# Patient Record
Sex: Female | Born: 1965 | Race: Black or African American | Hispanic: No | Marital: Single | Smoking: Former smoker
Health system: Southern US, Community
[De-identification: ages and names within clinical notes are randomized; demographics above are authoritative.]

## PROBLEM LIST (undated history)

## (undated) DIAGNOSIS — Q2112 Patent foramen ovale: Secondary | ICD-10-CM

## (undated) DIAGNOSIS — H9192 Unspecified hearing loss, left ear: Secondary | ICD-10-CM

## (undated) DIAGNOSIS — K219 Gastro-esophageal reflux disease without esophagitis: Secondary | ICD-10-CM

## (undated) DIAGNOSIS — Q211 Atrial septal defect: Secondary | ICD-10-CM

## (undated) DIAGNOSIS — Z86018 Personal history of other benign neoplasm: Secondary | ICD-10-CM

## (undated) DIAGNOSIS — J9601 Acute respiratory failure with hypoxia: Secondary | ICD-10-CM

## (undated) DIAGNOSIS — J449 Chronic obstructive pulmonary disease, unspecified: Secondary | ICD-10-CM

## (undated) DIAGNOSIS — U071 COVID-19: Secondary | ICD-10-CM

## (undated) HISTORY — PX: OTHER SURGICAL HISTORY: SHX169

## (undated) HISTORY — DX: Chronic obstructive pulmonary disease, unspecified: J44.9

## (undated) HISTORY — DX: Gastro-esophageal reflux disease without esophagitis: K21.9

## (undated) HISTORY — DX: Unspecified hearing loss, left ear: H91.92

## (undated) HISTORY — DX: Personal history of other benign neoplasm: Z86.018

---
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
## 2015-11-03 DIAGNOSIS — K635 Polyp of colon: Secondary | ICD-10-CM | POA: Insufficient documentation

## 2016-03-04 DIAGNOSIS — E785 Hyperlipidemia, unspecified: Secondary | ICD-10-CM | POA: Insufficient documentation

## 2018-12-05 ENCOUNTER — Ambulatory Visit (HOSPITAL_BASED_OUTPATIENT_CLINIC_OR_DEPARTMENT_OTHER): Payer: Medicaid Other | Admitting: Family Medicine

## 2018-12-05 ENCOUNTER — Observation Stay (HOSPITAL_COMMUNITY)
Admission: EM | Admit: 2018-12-05 | Discharge: 2018-12-07 | Disposition: A | Discharge status: Home / Self Care | Payer: Medicaid Other | Attending: Internal Medicine | Admitting: Internal Medicine

## 2018-12-05 ENCOUNTER — Other Ambulatory Visit: Payer: Self-pay

## 2018-12-05 ENCOUNTER — Encounter: Payer: Self-pay | Admitting: Family Medicine

## 2018-12-05 ENCOUNTER — Encounter (HOSPITAL_COMMUNITY): Payer: Self-pay

## 2018-12-05 ENCOUNTER — Emergency Department (HOSPITAL_COMMUNITY): Payer: Medicaid Other

## 2018-12-05 VITALS — BP 167/105 | HR 96 | Temp 98.7°F | Resp 18 | Ht 70.0 in | Wt 165.6 lb

## 2018-12-05 DIAGNOSIS — Z7951 Long term (current) use of inhaled steroids: Secondary | ICD-10-CM | POA: Diagnosis not present

## 2018-12-05 DIAGNOSIS — J441 Chronic obstructive pulmonary disease with (acute) exacerbation: Secondary | ICD-10-CM | POA: Insufficient documentation

## 2018-12-05 DIAGNOSIS — R0602 Shortness of breath: Secondary | ICD-10-CM

## 2018-12-05 DIAGNOSIS — Z87891 Personal history of nicotine dependence: Secondary | ICD-10-CM | POA: Insufficient documentation

## 2018-12-05 DIAGNOSIS — J449 Chronic obstructive pulmonary disease, unspecified: Secondary | ICD-10-CM

## 2018-12-05 DIAGNOSIS — D333 Benign neoplasm of cranial nerves: Secondary | ICD-10-CM | POA: Diagnosis not present

## 2018-12-05 DIAGNOSIS — H9192 Unspecified hearing loss, left ear: Secondary | ICD-10-CM | POA: Insufficient documentation

## 2018-12-05 DIAGNOSIS — Z86018 Personal history of other benign neoplasm: Secondary | ICD-10-CM | POA: Insufficient documentation

## 2018-12-05 DIAGNOSIS — R0902 Hypoxemia: Secondary | ICD-10-CM | POA: Diagnosis not present

## 2018-12-05 DIAGNOSIS — R002 Palpitations: Secondary | ICD-10-CM | POA: Diagnosis not present

## 2018-12-05 DIAGNOSIS — R030 Elevated blood-pressure reading, without diagnosis of hypertension: Secondary | ICD-10-CM | POA: Diagnosis not present

## 2018-12-05 DIAGNOSIS — R062 Wheezing: Secondary | ICD-10-CM | POA: Diagnosis present

## 2018-12-05 DIAGNOSIS — J439 Emphysema, unspecified: Secondary | ICD-10-CM

## 2018-12-05 DIAGNOSIS — E876 Hypokalemia: Secondary | ICD-10-CM | POA: Diagnosis not present

## 2018-12-05 DIAGNOSIS — R739 Hyperglycemia, unspecified: Secondary | ICD-10-CM

## 2018-12-05 DIAGNOSIS — J4489 Other specified chronic obstructive pulmonary disease: Secondary | ICD-10-CM

## 2018-12-05 DIAGNOSIS — I8392 Asymptomatic varicose veins of left lower extremity: Secondary | ICD-10-CM | POA: Insufficient documentation

## 2018-12-05 DIAGNOSIS — I83812 Varicose veins of left lower extremities with pain: Secondary | ICD-10-CM | POA: Diagnosis not present

## 2018-12-05 DIAGNOSIS — J9601 Acute respiratory failure with hypoxia: Secondary | ICD-10-CM | POA: Diagnosis not present

## 2018-12-05 DIAGNOSIS — R51 Headache: Secondary | ICD-10-CM | POA: Diagnosis not present

## 2018-12-05 HISTORY — DX: Personal history of other benign neoplasm: Z86.018

## 2018-12-05 HISTORY — DX: Unspecified hearing loss, left ear: H91.92

## 2018-12-05 LAB — BASIC METABOLIC PANEL
Anion gap: 11 (ref 5–15)
BUN: 8 mg/dL (ref 6–20)
CHLORIDE: 103 mmol/L (ref 98–111)
CO2: 27 mmol/L (ref 22–32)
Calcium: 9.6 mg/dL (ref 8.9–10.3)
Creatinine, Ser: 0.97 mg/dL (ref 0.44–1.00)
GFR calc Af Amer: >60 mL/min (ref >60)
GFR calc non Af Amer: >60 mL/min (ref >60)
Glucose, Bld: 97 mg/dL (ref 70–99)
Potassium: 4.3 mmol/L (ref 3.5–5.1)
SODIUM: 141 mmol/L (ref 135–145)

## 2018-12-05 LAB — CBC
HCT: 47.6 % — ABNORMAL HIGH (ref 36.0–46.0)
Hemoglobin: 15.5 g/dL — ABNORMAL HIGH (ref 12.0–15.0)
MCH: 30.8 pg (ref 26.0–34.0)
MCHC: 32.6 g/dL (ref 30.0–36.0)
MCV: 94.6 fL (ref 80.0–100.0)
Platelets: 191 10*3/uL (ref 150–400)
RBC: 5.03 MIL/uL (ref 3.87–5.11)
RDW: 12.4 % (ref 11.5–15.5)
WBC: 8.1 10*3/uL (ref 4.0–10.5)
nRBC: 0 % (ref 0.0–0.2)

## 2018-12-05 LAB — I-STAT VENOUS BLOOD GAS, ED
BICARBONATE: 27.2 mmol/L (ref 20.0–28.0)
O2 Saturation: 56 %
TCO2: 29 mmol/L (ref 22–32)
pCO2, Ven: 51 mmHg (ref 44.0–60.0)
pH, Ven: 7.335 (ref 7.250–7.430)
pO2, Ven: 32 mmHg (ref 32.0–45.0)

## 2018-12-05 LAB — I-STAT TROPONIN, ED: Troponin i, poc: 0.01 ng/mL (ref 0.00–0.08)

## 2018-12-05 MED ORDER — ACETAMINOPHEN 325 MG PO TABS
650.0000 mg | ORAL_TABLET | Freq: Four times a day (QID) | ORAL | Status: DC | PRN
  Administered 2018-12-06: 650 mg via ORAL
  Filled 2018-12-05: qty 2

## 2018-12-05 MED ORDER — ENOXAPARIN SODIUM 40 MG/0.4ML ~~LOC~~ SOLN: 40.0000 mg | SUBCUTANEOUS | Status: DC

## 2018-12-05 MED ORDER — IPRATROPIUM BROMIDE 0.02 % IN SOLN
0.5000 mg | Freq: Once | RESPIRATORY_TRACT | Status: AC
  Administered 2018-12-05: 0.5 mg via RESPIRATORY_TRACT
  Filled 2018-12-05: qty 2.5

## 2018-12-05 MED ORDER — ALBUTEROL (5 MG/ML) CONTINUOUS INHALATION SOLN
10.0000 mg/h | INHALATION_SOLUTION | Freq: Once | RESPIRATORY_TRACT | Status: AC
  Administered 2018-12-05: 10 mg/h via RESPIRATORY_TRACT
  Filled 2018-12-05: qty 20

## 2018-12-05 MED ORDER — ONDANSETRON HCL 4 MG PO TABS: 4.0000 mg | ORAL_TABLET | Freq: Four times a day (QID) | ORAL | Status: DC | PRN

## 2018-12-05 MED ORDER — ONDANSETRON HCL 4 MG/2ML IJ SOLN: 4.0000 mg | Freq: Four times a day (QID) | INTRAMUSCULAR | Status: DC | PRN

## 2018-12-05 MED ORDER — METHYLPREDNISOLONE SODIUM SUCC 40 MG IJ SOLR
40.0000 mg | Freq: Four times a day (QID) | INTRAMUSCULAR | Status: DC
  Administered 2018-12-06 – 2018-12-07 (×6): 40 mg via INTRAVENOUS
  Filled 2018-12-05 (×6): qty 1

## 2018-12-05 MED ORDER — IPRATROPIUM-ALBUTEROL 0.5-2.5 (3) MG/3ML IN SOLN
3.0000 mL | RESPIRATORY_TRACT | Status: DC
  Administered 2018-12-06 (×2): 3 mL via RESPIRATORY_TRACT
  Filled 2018-12-05 (×2): qty 3

## 2018-12-05 MED ORDER — ORAL CARE MOUTH RINSE: 15.0000 mL | Freq: Two times a day (BID) | OROMUCOSAL | Status: DC

## 2018-12-05 MED ORDER — BUDESONIDE 0.25 MG/2ML IN SUSP
0.2500 mg | Freq: Two times a day (BID) | RESPIRATORY_TRACT | Status: DC
  Administered 2018-12-06 – 2018-12-07 (×3): 0.25 mg via RESPIRATORY_TRACT
  Filled 2018-12-05 (×4): qty 2

## 2018-12-05 MED ORDER — ALBUTEROL (5 MG/ML) CONTINUOUS INHALATION SOLN
10.0000 mg/h | INHALATION_SOLUTION | Freq: Once | RESPIRATORY_TRACT | Status: AC
  Administered 2018-12-05: 10 mg/h via RESPIRATORY_TRACT

## 2018-12-05 MED ORDER — HYDROCODONE-ACETAMINOPHEN 5-325 MG PO TABS
1.0000 | ORAL_TABLET | Freq: Once | ORAL | Status: AC
  Administered 2018-12-05: 1 via ORAL
  Filled 2018-12-05: qty 1

## 2018-12-05 MED ORDER — IPRATROPIUM-ALBUTEROL 0.5-2.5 (3) MG/3ML IN SOLN: 3.0000 mL | RESPIRATORY_TRACT | Status: DC | PRN

## 2018-12-05 MED ORDER — ACETAMINOPHEN 650 MG RE SUPP: 650.0000 mg | Freq: Four times a day (QID) | RECTAL | Status: DC | PRN

## 2018-12-05 MED ORDER — METHYLPREDNISOLONE SODIUM SUCC 125 MG IJ SOLR
125.0000 mg | Freq: Once | INTRAMUSCULAR | Status: AC
  Administered 2018-12-05: 125 mg via INTRAVENOUS
  Filled 2018-12-05: qty 2

## 2018-12-05 NOTE — ED Provider Notes (Signed)
[facility] EMERGENCY DEPARTMENT Provider Note   CSN: [id number] Arrival date & time: 12/05/18  1811     History   Chief Complaint Chief Complaint  Patient presents with  . Shortness of Breath    HPI Madison Phillips is a 52 y.o. female.  The history is provided by the patient.  Shortness of Breath  This is a recurrent problem. The problem occurs continuously.The current episode started more than 2 days ago. The problem has been gradually worsening. Associated symptoms include wheezing. Pertinent negatives include no fever, no coryza, no rhinorrhea, no sore throat, no ear pain, no cough, no hemoptysis, no PND, no chest pain, no syncope, no vomiting, no abdominal pain, no rash, no leg pain and no leg swelling. It is unknown what precipitated the problem. She has tried inhaled steroids for the symptoms. The treatment provided no relief. She has had prior ED visits. Associated medical issues include COPD. Associated medical issues do not include PE or CAD.    Past Medical History:  Diagnosis Date  . COPD (chronic obstructive pulmonary disease) ([place])   . 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 Left ear hearing loss 12/05/2018    Patient Active Problem List   Diagnosis Date Noted  . Left ear hearing loss 12/05/2018  . 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 Varicose veins of left lower extremity 12/05/2018  . Acute respiratory failure with hypoxia ([place]) 12/05/2018    History reviewed. No pertinent surgical history.   OB History   No obstetric history on file.      Home Medications    Prior to Admission medications   Medication Sig Start Date End Date Taking? Authorizing Provider  budesonide-formoterol (SYMBICORT) 80-4.5 MCG/ACT inhaler Inhale 1 puff into the lungs 2 (two) times daily.    [provider]    Family History No family history on file.  Social History Social History   Tobacco Use  . Smoking status: Former Research scientist (life sciences)  .  Smokeless tobacco: Never Used  Substance Use Topics  . Alcohol use: Yes    Alcohol/week: 2.0 - 3.0 standard drinks    Types: 2 - 3 Cans of beer per week    Comment: a night  . Drug use: Not Currently     Allergies   Patient has no known allergies.   Review of Systems Review of Systems  Constitutional: Negative for chills and fever.  HENT: Negative for ear pain, rhinorrhea and sore throat.   Eyes: Negative for pain and visual disturbance.  Respiratory: Positive for shortness of breath and wheezing. Negative for cough and hemoptysis.   Cardiovascular: Negative for chest pain, palpitations, leg swelling, syncope and PND.  Gastrointestinal: Negative for abdominal pain and vomiting.  Genitourinary: Negative for dysuria and hematuria.  Musculoskeletal: Negative for arthralgias and back pain.  Skin: Negative for color change and rash.  Neurological: Negative for seizures and syncope.  All other systems reviewed and are negative.    Physical Exam Updated Vital Signs  ED Triage Vitals  Enc Vitals Group     BP 12/05/18 1818 (!) 171/115     Pulse Rate 12/05/18 1818 (!) 102     Resp 12/05/18 1818 (!) 22     Temp 12/05/18 1818 98.6 F (37 C)     Temp Source 12/05/18 1818 Oral     SpO2 12/05/18 1818 92 %     Weight 12/05/18 1819 165 lb (74.8 kg)     Height 12/05/18 1819 5\' 10"  (1.778  m)     Head Circumference --      Peak Flow --      Pain Score 12/05/18 1818 7     Pain Loc --      Pain Edu? --      Excl. in [place]? --     Physical Exam Vitals signs and nursing note reviewed.  Constitutional:      General: She is in acute distress.     Appearance: She is well-developed and normal weight.  HENT:     Head: Normocephalic and atraumatic.     Mouth/Throat:     Mouth: Mucous membranes are moist.  Eyes:     Conjunctiva/sclera: Conjunctivae normal.     Pupils: Pupils are equal, round, and reactive to light.  Neck:     Musculoskeletal: Normal range of motion and neck supple.    Cardiovascular:     Rate and Rhythm: Normal rate and regular rhythm.     Heart sounds: No murmur.  Pulmonary:     Effort: Tachypnea, accessory muscle usage and respiratory distress present.     Breath sounds: Decreased breath sounds present. No wheezing, rhonchi or rales.  Abdominal:     Palpations: Abdomen is soft.     Tenderness: There is no abdominal tenderness.  Musculoskeletal: Normal range of motion.     Right lower leg: No edema.     Left lower leg: No edema.  Skin:    General: Skin is warm and dry.     Capillary Refill: Capillary refill takes less than 2 seconds.  Neurological:     General: No focal deficit present.     Mental Status: She is alert.      ED Treatments / Results  Labs (all labs ordered are listed, but only abnormal results are displayed) Labs Reviewed  CBC - Abnormal; Notable for the following components:      Result Value   Hemoglobin 15.5 (*)    HCT 47.6 (*)    All other components within normal limits  BASIC METABOLIC PANEL  BLOOD GAS, VENOUS  I-STAT TROPONIN, ED  I-STAT VENOUS BLOOD GAS, ED    EKG EKG Interpretation  Date/Time:  Tuesday December 05 2018 18:18:27 EST Ventricular Rate:  106 PR Interval:  142 QRS Duration: 68 QT Interval:  338 QTC Calculation: 448 R Axis:   80 Text Interpretation:  Sinus tachycardia Biatrial enlargement Left ventricular hypertrophy ST-t wave abnormality Abnormal ekg Confirmed by Carmin Muskrat [phone]) on 12/05/2018 6:26:13 PM   Radiology Dg Chest Portable 1 View  Result Date: 12/05/2018 CLINICAL DATA:  Shortness of breath EXAM: PORTABLE CHEST 1 VIEW COMPARISON:  None. FINDINGS: Cardiac shadow is within normal limits. The lungs are hyperinflated bilaterally. No focal infiltrate or sizable effusion are seen. No bony abnormality is noted. IMPRESSION: COPD without acute abnormality. Electronically Signed   By: Inez Catalina M.D.   On: 12/05/2018 19:31    Procedures .Critical Care Performed by: Lennice Sites, DO Authorized by: Lennice Sites, DO   Critical care provider statement:    Critical care time (minutes):  35   Critical care was necessary to treat or prevent imminent or life-threatening deterioration of the following conditions:  Respiratory failure   Critical care was time spent personally by me on the following activities:  Development of treatment plan with patient or surrogate, discussions with primary provider, evaluation of patient's response to treatment, obtaining history from patient or surrogate, ordering and performing treatments and interventions, ordering and review of  laboratory studies, ordering and review of radiographic studies, pulse oximetry, re-evaluation of patient's condition and review of old charts   I assumed direction of critical care for this patient from another provider in my specialty: no     (including critical care time)  Medications Ordered in ED Medications  albuterol (PROVENTIL,VENTOLIN) solution continuous neb (10 mg/hr Nebulization Given 12/05/18 1850)  ipratropium (ATROVENT) nebulizer solution 0.5 mg (0.5 mg Nebulization Given 12/05/18 1849)  methylPREDNISolone sodium succinate (SOLU-MEDROL) 125 mg/2 mL injection 125 mg (125 mg Intravenous Given 12/05/18 1845)  HYDROcodone-acetaminophen (NORCO/VICODIN) 5-325 MG per tablet 1 tablet (1 tablet Oral Given 12/05/18 2005)  albuterol (PROVENTIL,VENTOLIN) solution continuous neb (10 mg/hr Nebulization Given 12/05/18 2015)     Initial Impression / Assessment and Plan / ED Course  I have reviewed the triage vital signs and the nursing notes.  Pertinent labs & imaging results that were available during my care of the patient were reviewed by me and considered in my medical decision making (see chart for details).     Madison Phillips is a 52 year old female with history of COPD who presents the ED with shortness of breath.  Patient hypoxic in triage that improved with 2 L.  Patient otherwise with  unremarkable vitals except for mild tachycardia.  Patient with shortness of breath for the last several days.  Has a history of COPD but is not on any medications as she moved here last year and has not established care.  She has been using her friend's Symbicort without much relief.  Patient with diminished breath sounds on exam.  Poor air movement.  Signs of respiratory distress.  Will give albuterol continuous treatment, Atrovent, Solu-Medrol.  Will consider BiPAP although patient appears that she does not want that at this time.  Will get chest x-ray, basic labs.  EKG shows sinus rhythm.  No signs of ischemic changes.  Patient with improvement following breathing treatment.  Has improvement of air movement.  Will order second continuous and admit patient for COPD exacerbation.  No signs of pneumonia, pneumothorax, pleural effusion on chest x-ray.  Blood gas overall reassuring.  pH is normal.  CO2 within normal limits.  No significant electrolyte abnormality, kidney injury, anemia.  Troponin within normal limits.  Low concern for PE at this time given likely COPD.  Patient has chronic left calf pain.  There is no signs of leg swelling.  Patient admitted to hospitalist for further care.  This chart was dictated using voice recognition software.  Despite best efforts to proofread,  errors can occur which can change the documentation meaning.   Final Clinical Impressions(s) / ED Diagnoses   Final diagnoses:  COPD exacerbation ([place])  Acute respiratory failure with hypoxia [facility])    ED Discharge Orders    None       Lennice Sites, DO 12/05/18 2032

## 2018-12-05 NOTE — Progress Notes (Addendum)
Pt started on second 10mg  Albuterol CAT. Pt with improved aeration on R, but very diminished on L. SpO2 90% on RA. RT will continue to monitor.

## 2018-12-05 NOTE — ED Notes (Signed)
EDP notified on pt.'s left lower leg pain .

## 2018-12-05 NOTE — Progress Notes (Signed)
Subjective:    Patient ID: Madison Phillips, female    DOB: [birth year], 52 y.o.   MRN: [id number]  HPI       52 year old female new to the practice.  Patient states that she moved here about a year ago from [place].  Patient states that her past medical history is significant for diagnosis of COPD in 2014.  Patient states that up until that time she had smoked about a pack a day of cigarettes but quit smoking in 2014 after her diagnosis.  Patient states that over the past 2 weeks she has had worsening of her shortness of breath and patient states that she gets shortness of breath with minimal exertion such as getting out of bed and attempting to walk.  Patient states that today she feels as if she has had some onset of chills, nasal congestion and occasional nonproductive cough.  Patient also states that over the past 2 weeks she has felt like her heart is fluttering and pounding and this happens 2-3 times daily.  Patient reports a history of acoustic neuroma on the left which was treated with CyberKnife surgery.  Patient reports that she has had recent episodes of tightness which starts in her left forehead, causes her left eye to spasm closed and she feels weakness on her left side as if she is having a stroke.  Patient states that the symptoms resolve and she will return to baseline.  Patient also reports a history of left-sided varicose veins.  Patient is currently working as a Radiation protection practitioner but states that at times she gets short of breath when talking especially if she starts getting into an intense conversation.      Patient states that she does not like to take albuterol for her shortness of breath as this causes her to have increased sensation of palpitations/increased heart rate.  Patient is currently taking Symbicort to help with her shortness of breath but she does not feel that this is still working.  Patient reports that she has never been prescribed Symbicort but has been taking  Symbicort that was given to her by someone else.  Patient states that she was on a purple round medicine in the past which did seem to help with her breathing.  Patient is currently on no medications that were specifically prescribed for her because she has been without insurance and cannot afford medications.      Patient reports family history of maternal aunt dying from heart disease and patient's father died from lung cancer.  Patient states that her father smoked approximately 4 packs/day of tobacco/cigarettes.  Patient reports that she no longer smokes since 2014 when she was diagnosed with COPD.  Patient does drink 2-3 beers on a daily basis.  Patient is currently living with her adult sister.  Patient reports that her surgical history consist of her CyberKnife therapy and remote history of surgery for an ectopic pregnancy.          Review of Systems  Constitutional: Positive for chills and fatigue. Negative for fever.  HENT: Positive for congestion. Negative for sore throat and trouble swallowing.   Eyes: Negative for photophobia and visual disturbance.  Respiratory: Positive for cough, shortness of breath and wheezing.   Cardiovascular: Positive for palpitations and leg swelling. Negative for chest pain.  Gastrointestinal: Negative for abdominal pain and nausea.  Endocrine: Negative for polydipsia, polyphagia and polyuria.  Genitourinary: Negative for dysuria and frequency.  Musculoskeletal: Positive for arthralgias. Negative for gait  problem.  Neurological: Positive for dizziness, facial asymmetry, weakness, numbness and headaches. Negative for syncope and speech difficulty.  Hematological: Negative for adenopathy. Does not bruise/bleed easily.       Objective:   Physical Exam BP (!) 167/105 (BP Location: Right Arm, Patient Position: Sitting, Cuff Size: Normal)   Pulse 96   Temp 98.7 F (37.1 C) (Oral)   Resp 18   Ht 5\' 10"  (1.778 m)   Wt 165 lb 9.6 oz (75.1 kg)   SpO2 91%    BMI 23.76 kg/m Nurse's notes and vital signs reviewed General- well-nourished, well-developed female who appears slightly anxious and has mild increase in respiratory rate mild increased work of breathing but no acute distress who is wearing oxygen by nasal cannula (placed on patient by CMA here in office as patient with  ENT- right TM dull, left TM obscured by cerumen within the canal.  Nares with moderate edema of the nasal mucosa with scant clear discharge, patient with mild posterior pharynx erythema Neck- patient with muscle spasm in the neck left side of neck and left posterior greater than right, no carotid bruit, possible mild thyromegaly Cardiovascular- difficult to auscultate, very faint heart sounds but she appears to have regular rate and rhythm Lungs- patient initially had some scattered coarse breath sounds but these cleared without intervention, patient was slightly increased respiratory rate and patient has some shortness of breath with talking while wearing oxygen by nasal cannula at 2 L ABD- soft and nontender EXT- no edema but patient is wearing compression hose  Psych- appears anxious; normal mood otherwise     Assessment & Plan:  1. Shortness of breath Patient with complaint of worsening shortness of breath and patient reports history of diagnosis of COPD in 2014.  Patient states that her shortness of breath has been worsening over the past 2 weeks and she is now having shortness of breath with minimal exertion.  Patient was found to have room air oxygen saturation of 91% here at the office.  Oxygen saturation improved to 96% with use of 2 L of oxygen by nasal cannula which was placed on the patient due to her shortness of breath and patient symptomatic with decreased ability to talk secondary to shortness of breath as well as some accessory muscle use.  Discussed with the patient that there were likely no interventions that could be done here in the office which would increase her  oxygenation to a level at which it would be safe for patient to leave the office within the next few hours prior to closing as patient could not be sent home with the use of oxygen from the office.  Discussed with patient that I would like for her to be seen at the emergency department for further evaluation.  Patient stated that she would like to drive herself to the emergency department however with her hypoxia and need for oxygen, I was told that she would need to either find someone to drive her from the office of her to the emergency department or go by EMS.  Patient reported that she wished to go home and have her sister drive her to the hospital.  Patient was willing to sign AMA paperwork expressing that she was aware of the risk of worsening respiratory distress/respiratory failure, cardiac arrhythmia or arrest as well as possible CVA as patient with repeat blood pressure of 196/116 and patient with complaint of recent episodes of onset of left-sided numbness and weakness.  Patient did sign AMA  paperwork which will be scanned into patient's chart as patient wishes to drive home rather than go immediately to the emergency department.  2. Hypoxia Patient with room air oxygen level of 91% and patient was placed on oxygen by nasal cannula at 2 L which did increase her oxygen saturation to 96% as well as decreased her sensation of shortness of breath and decreased work of breathing.  Patient was encouraged to go to the emergency department for further evaluation by EMS or by having her sister pick her up from this office and drive her to the emergency department across the street however patient did not wish to have EMS transport secondary to the cost and patient wanted to drive her own car home and then have her sister take her to the emergency department.  Patient agreed to sign AMA paperwork.  Patient was escorted in a wheelchair to the front of the clinic by [place].  3. Chronic obstructive pulmonary  disease, unspecified COPD type ([place]) Patient reports history of COPD which she states was diagnosed in 2014 while she was living in [place].  Patient reports that she has stopped smoking since 2014 when she was diagnosed with COPD.  Patient has been using Symbicort that was not prescribed for her to help with her COPD/shortness of breath but she reports that she has found over the past 2 weeks that this medication is no longer effective.  4. Palpitations Patient with complaint of onset 2-3 times per day of pounding heart rate along with sensation of increased heart rate/palpitations.  Patient has been encouraged to go to the emergency department for further evaluation as she also has low oxygen saturation, shortness of breath and elevated blood pressure.  5. Elevated blood pressure reading Patient denies a past history of hypertension but at today's visit, patient with elevated blood pressure of 167/105 upon initial presentation followed by repeat blood pressure well patient at rest and wearing oxygen by nasal cannula of 196/116.  Patient has had recent complaints of left-sided numbness and weakness and patient is encouraged to go to the emergency department for further evaluation of her elevated blood pressure and sensation of left-sided facial numbness and weakness  6. Hearing loss of left ear, unspecified hearing loss type Patient reports history of hearing loss in the left ear secondary to an acoustic neuroma  7. History of acoustic neuroma Patient reports history of acoustic neuroma which was treated with CyberKnife therapy but which resulted in left-sided hearing loss  8. Varicose veins of left lower extremity with pain Patient with complaint of varicose veins worse in the left lower leg that cause swelling and pain  An After Visit Summary was printed and given to the patient.  Return for Follow-up after emergency department evaluation.

## 2018-12-05 NOTE — ED Triage Notes (Signed)
Pt was seen at [facility] for SOB X2 weeks. Pt states she was dx with COPD in the past and has been getting worse over the past 6 months. Upon arrival to ED pt O2 sat was in the 80's. Placed on 2L [place] O2 came up to 92%.

## 2018-12-05 NOTE — H&P (Signed)
History and Physical    Madison Phillips [id number]:[id number] DOB: 1966/04/10 DOA: 12/05/2018  PCP: Patient, No Pcp Per  Patient coming from: Home.  Chief Complaint: Shortness of breath.  HPI: Madison Phillips is a 52 y.o. female with history of COPD who moved from [place] in 2014 has not been taking medications for last 4 years started having worsening shortness of breath last 2 weeks and started taking her Symbicort again last week despite which patient's shortness of breath has not been getting better.  Patient has been getting episodic palpitations 2.  Denies any chest pain.  Has some crampiness of the left lower extremity from varicose veins.  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  Denies any weakness of the extremities.  ED Course: In the ER patient is found to be wheezing short of breath chest x-ray showing features consistent with COPD EKG showing sinus rhythm with possible LVH and left atrial enlargement.  Patient was placed on nebulizer treatment IV steroids and admitted for acute respiratory failure with hypoxia secondary to COPD exacerbation.  Review of Systems: As per HPI, rest all negative.   Past Medical History:  Diagnosis Date  . COPD (chronic obstructive pulmonary disease) ([place])   . 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 Left ear hearing loss 12/05/2018    Past Surgical History:  Procedure Laterality Date  . cyber knife    . ectopic pregnanacy       reports that she has quit smoking. She has never used smokeless tobacco. She reports current alcohol use of about 2.0 - 3.0 standard drinks of alcohol per week. She reports previous drug use.  No Known Allergies  Family History  Problem Relation Age of Onset  . Hypothyroidism Mother   . Diabetes Mellitus II Maternal Aunt     Prior to Admission medications   Medication Sig Start Date End Date Taking? Authorizing Provider    budesonide-formoterol (SYMBICORT) 80-4.5 MCG/ACT inhaler Inhale 1 puff into the lungs 2 (two) times daily.    [provider]    Physical Exam: Vitals:   12/05/18 2030 12/05/18 2045 12/05/18 2100 12/05/18 2132  BP: (!) 144/99 138/87 136/85 (!) 148/94  Pulse: (!) 119 (!) 130 (!) 135 (!) 126  Resp: (!) 26 16 (!) 25   Temp:    97.9 F (36.6 C)  TempSrc:      SpO2: 98% 98% 91% 91%  Weight:      Height:          Constitutional: Moderately built and nourished. Vitals:   12/05/18 2030 12/05/18 2045 12/05/18 2100 12/05/18 2132  BP: (!) 144/99 138/87 136/85 (!) 148/94  Pulse: (!) 119 (!) 130 (!) 135 (!) 126  Resp: (!) 26 16 (!) 25   Temp:    97.9 F (36.6 C)  TempSrc:      SpO2: 98% 98% 91% 91%  Weight:      Height:       Eyes: Anicteric no pallor. ENMT: No discharge from the ears eyes nose or mouth. Neck: No mass felt.  No neck rigidity.  No JVD appreciated. Respiratory: Bilateral expiratory wheeze and no crepitations. Cardiovascular: S1-S2 heard no murmurs appreciated. Abdomen: Soft nontender bowel sounds present. Musculoskeletal: No edema.  No joint effusion. Skin: No rash. Neurologic: Alert awake oriented to time place and person.  Moves all extremities. Psychiatric: Appears normal.  Normal affect.   Labs on Admission: I  have personally reviewed following labs and imaging studies  CBC: Recent Labs  Lab 12/05/18 1833  WBC 8.1  HGB 15.5*  HCT 47.6*  MCV 94.6  PLT 470   Basic Metabolic Panel: Recent Labs  Lab 12/05/18 1833  NA 141  K 4.3  CL 103  CO2 27  GLUCOSE 97  BUN 8  CREATININE 0.97  CALCIUM 9.6   GFR: Estimated Creatinine Clearance: 71.7 mL/min (by C-G formula based on SCr of 0.97 mg/dL). Liver Function Tests: No results for input(s): AST, ALT, ALKPHOS, BILITOT, PROT, ALBUMIN in the last 168 hours. No results for input(s): LIPASE, AMYLASE in the last 168 hours. No results for input(s): AMMONIA in the last 168 hours. Coagulation  Profile: No results for input(s): INR, PROTIME in the last 168 hours. Cardiac Enzymes: No results for input(s): CKTOTAL, CKMB, CKMBINDEX, TROPONINI in the last 168 hours. BNP (last 3 results) No results for input(s): PROBNP in the last 8760 hours. HbA1C: No results for input(s): HGBA1C in the last 72 hours. CBG: No results for input(s): GLUCAP in the last 168 hours. Lipid Profile: No results for input(s): CHOL, HDL, LDLCALC, TRIG, CHOLHDL, LDLDIRECT in the last 72 hours. Thyroid Function Tests: No results for input(s): TSH, T4TOTAL, FREET4, T3FREE, THYROIDAB in the last 72 hours. Anemia Panel: No results for input(s): VITAMINB12, FOLATE, FERRITIN, TIBC, IRON, RETICCTPCT in the last 72 hours. Urine analysis: No results found for: COLORURINE, APPEARANCEUR, LABSPEC, PHURINE, GLUCOSEU, HGBUR, BILIRUBINUR, KETONESUR, PROTEINUR, UROBILINOGEN, NITRITE, LEUKOCYTESUR Sepsis Labs: @LABRCNTIP (procalcitonin:4,lacticidven:4) )No results found for this or any previous visit (from the past 240 hour(s)).   Radiological Exams on Admission: Dg Chest Portable 1 View  Result Date: 12/05/2018 CLINICAL DATA:  Shortness of breath EXAM: PORTABLE CHEST 1 VIEW COMPARISON:  None. FINDINGS: Cardiac shadow is within normal limits. The lungs are hyperinflated bilaterally. No focal infiltrate or sizable effusion are seen. No bony abnormality is noted. IMPRESSION: COPD without acute abnormality. Electronically Signed   By: Inez Catalina M.D.   On: 12/05/2018 19:31    EKG: Independently reviewed.  Normal sinus rhythm with possible LVH and left atrial enlargement.  Assessment/Plan Principal Problem:   Acute respiratory failure with hypoxia (HCC) Active Problems:   History of acoustic neuroma   COPD exacerbation ([place])    1. Acute respiratory failure with hypoxia secondary to COPD exacerbation for which patient has been placed on IV steroids nebulizer and Pulmicort.  Since patient has exertional symptoms and lower  extremity cramping will check d-dimer and if positive will get CT angiogram. 2. Left facial with twitching at times with history of a caustic neuroma status post removal will check CT head. 3. Previous history of tobacco abuse which patient quit 4 years ago.   DVT prophylaxis: Lovenox. Code Status: Full code. Family Communication: Discussed with patient. Disposition Plan: Home. Consults called: None. Admission status: Observation.   Rise Patience MD [facility] Pager [phone].  If 7PM-7AM, please contact night-coverage [email or web address] Password [facility]  12/05/2018, 10:39 PM

## 2018-12-05 NOTE — ED Provider Notes (Signed)
[facility] EMERGENCY DEPARTMENT Provider Note   CSN: [id number] Arrival date & time: 12/05/18  1811     History   Chief Complaint Chief Complaint  Patient presents with  . Shortness of Breath    HPI Madison Phillips is a 52 y.o. female.  HPI Patient presents with dyspnea. Patient is a history of COPD, diagnosed about 6 years ago. Patient has subsequently stopped smoking.  She notes that she has had worsening dyspnea for some time, but in particular over the past weeks she has had increased work of breathing, with no relief in spite of albuterol and inhaled steroids. No fever, no chest pain, no nausea, no vomiting.  Past Medical History:  Diagnosis Date  . COPD (chronic obstructive pulmonary disease) ([place])   . 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 Left ear hearing loss 12/05/2018    Patient Active Problem List   Diagnosis Date Noted  . Left ear hearing loss 12/05/2018  . 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 Varicose veins of left lower extremity 12/05/2018    History reviewed. No pertinent surgical history.   OB History   No obstetric history on file.      Home Medications    Prior to Admission medications   Medication Sig Start Date End Date Taking? Authorizing Provider  budesonide-formoterol (SYMBICORT) 80-4.5 MCG/ACT inhaler Inhale 1 puff into the lungs 2 (two) times daily.    [provider]    Family History No family history on file.  Social History Social History   Tobacco Use  . Smoking status: Former Research scientist (life sciences)  . Smokeless tobacco: Never Used  Substance Use Topics  . Alcohol use: Yes    Alcohol/week: 2.0 - 3.0 standard drinks    Types: 2 - 3 Cans of beer per week    Comment: a night  . Drug use: Not Currently     Allergies   Patient has no known allergies.   Review of Systems Review of Systems  Constitutional:       Per HPI, otherwise negative  HENT:       Per HPI, otherwise negative  Respiratory:        Per HPI, otherwise negative  Cardiovascular:       Per HPI, otherwise negative  Gastrointestinal: Negative for vomiting.  Endocrine:       Negative aside from HPI  Genitourinary:       Neg aside from HPI   Musculoskeletal:       Per HPI, otherwise negative  Skin: Negative.   Neurological: Negative for syncope.       History of acoustic neuroma with occasional spasms     Physical Exam Updated Vital Signs BP (!) 171/115 (BP Location: Left Arm)   Pulse (!) 102   Temp 98.6 F (37 C) (Oral)   Resp (!) 22   Ht 5\' 10"  (1.778 m)   Wt 74.8 kg   SpO2 92%   BMI 23.68 kg/m   Physical Exam Vitals signs and nursing note reviewed.  Constitutional:      General: She is not in acute distress.    Appearance: She is well-developed.  HENT:     Head: Normocephalic and atraumatic.  Eyes:     Conjunctiva/sclera: Conjunctivae normal.  Cardiovascular:     Rate and Rhythm: Regular rhythm. Tachycardia present.  Pulmonary:     Effort: Tachypnea and accessory muscle usage present.     Breath sounds: Decreased breath sounds present. No wheezing.  Abdominal:  General: There is no distension.  Musculoskeletal:     Right lower leg: No edema.     Left lower leg: No edema.  Skin:    General: Skin is warm and dry.  Neurological:     Mental Status: She is alert and oriented to person, place, and time.     Cranial Nerves: No cranial nerve deficit.      ED Treatments / Results  Labs (all labs ordered are listed, but only abnormal results are displayed) Labs Reviewed  BASIC METABOLIC PANEL  CBC  I-STAT TROPONIN, ED    EKG EKG Interpretation  Date/Time:  Tuesday December 05 2018 18:18:27 EST Ventricular Rate:  106 PR Interval:  142 QRS Duration: 68 QT Interval:  338 QTC Calculation: 448 R Axis:   80 Text Interpretation:  Sinus tachycardia Biatrial enlargement Left ventricular hypertrophy ST-t wave abnormality Abnormal ekg Confirmed by Carmin Muskrat [phone]) on 12/05/2018  6:26:13 PM   Radiology No results found.  Procedures Procedures (including critical care time)  Medications Ordered in ED Medications - No data to display   Initial Impression / Assessment and Plan / ED Course  I have reviewed the triage vital signs and the nursing notes.  Pertinent labs & imaging results that were available during my care of the patient were reviewed by me and considered in my medical decision making (see chart for details).  Patient with a history of COPD presents with increased work of breathing, tachypnea, tachycardia, and diminished breath sounds. Patient is afebrile, awake and alert, has no pain, there is low initial suspicion for ACS. However, given her increased work of breathing, as above, patient will require further interventions including albuterol, steroids, monitoring, management. Patient's care assumed by physician assistant Le[place].  Final Clinical Impressions(s) / ED Diagnoses  Shortness of breath   Carmin Muskrat, MD 12/05/18 1827

## 2018-12-06 ENCOUNTER — Observation Stay (HOSPITAL_COMMUNITY): Payer: Medicaid Other

## 2018-12-06 DIAGNOSIS — R739 Hyperglycemia, unspecified: Secondary | ICD-10-CM | POA: Diagnosis not present

## 2018-12-06 DIAGNOSIS — J441 Chronic obstructive pulmonary disease with (acute) exacerbation: Secondary | ICD-10-CM | POA: Diagnosis not present

## 2018-12-06 DIAGNOSIS — J9601 Acute respiratory failure with hypoxia: Secondary | ICD-10-CM | POA: Diagnosis not present

## 2018-12-06 DIAGNOSIS — R51 Headache: Secondary | ICD-10-CM | POA: Diagnosis not present

## 2018-12-06 LAB — BASIC METABOLIC PANEL
Anion gap: 13 (ref 5–15)
BUN: 12 mg/dL (ref 6–20)
CALCIUM: 9.1 mg/dL (ref 8.9–10.3)
CO2: 22 mmol/L (ref 22–32)
Chloride: 105 mmol/L (ref 98–111)
Creatinine, Ser: 1.28 mg/dL — ABNORMAL HIGH (ref 0.44–1.00)
GFR calc Af Amer: 55 mL/min — ABNORMAL LOW (ref >60)
GFR calc non Af Amer: 47 mL/min — ABNORMAL LOW (ref >60)
Glucose, Bld: 328 mg/dL — ABNORMAL HIGH (ref 70–99)
Potassium: 3.1 mmol/L — ABNORMAL LOW (ref 3.5–5.1)
Sodium: 140 mmol/L (ref 135–145)

## 2018-12-06 LAB — CBC
HCT: 43.8 % (ref 36.0–46.0)
Hemoglobin: 14.3 g/dL (ref 12.0–15.0)
MCH: 30.9 pg (ref 26.0–34.0)
MCHC: 32.6 g/dL (ref 30.0–36.0)
MCV: 94.6 fL (ref 80.0–100.0)
Platelets: 169 10*3/uL (ref 150–400)
RBC: 4.63 MIL/uL (ref 3.87–5.11)
RDW: 12.7 % (ref 11.5–15.5)
WBC: 8.9 10*3/uL (ref 4.0–10.5)
nRBC: 0 % (ref 0.0–0.2)

## 2018-12-06 LAB — GLUCOSE, CAPILLARY
GLUCOSE-CAPILLARY: 153 mg/dL — AB (ref 70–99)
Glucose-Capillary: 129 mg/dL — ABNORMAL HIGH (ref 70–99)

## 2018-12-06 LAB — D-DIMER, QUANTITATIVE: D-Dimer, Quant: 0.41 ug/mL-FEU (ref 0.00–0.50)

## 2018-12-06 LAB — MAGNESIUM: Magnesium: 1.7 mg/dL (ref 1.7–2.4)

## 2018-12-06 LAB — TSH: TSH: 0.537 u[IU]/mL (ref 0.350–4.500)

## 2018-12-06 LAB — HIV ANTIBODY (ROUTINE TESTING W REFLEX): HIV Screen 4th Generation wRfx: NONREACTIVE

## 2018-12-06 MED ORDER — METFORMIN HCL 500 MG PO TABS
500.0000 mg | ORAL_TABLET | Freq: Two times a day (BID) | ORAL | Status: DC
  Administered 2018-12-06 – 2018-12-07 (×3): 500 mg via ORAL
  Filled 2018-12-06 (×4): qty 1

## 2018-12-06 MED ORDER — POTASSIUM CHLORIDE CRYS ER 20 MEQ PO TBCR
20.0000 meq | EXTENDED_RELEASE_TABLET | Freq: Once | ORAL | Status: AC
  Administered 2018-12-06: 20 meq via ORAL
  Filled 2018-12-06: qty 1

## 2018-12-06 MED ORDER — PANTOPRAZOLE SODIUM 40 MG PO TBEC
40.0000 mg | DELAYED_RELEASE_TABLET | Freq: Every day | ORAL | Status: DC
  Administered 2018-12-07: 40 mg via ORAL
  Filled 2018-12-06 (×2): qty 1

## 2018-12-06 MED ORDER — POTASSIUM CHLORIDE CRYS ER 20 MEQ PO TBCR
40.0000 meq | EXTENDED_RELEASE_TABLET | Freq: Once | ORAL | Status: AC
  Administered 2018-12-06: 40 meq via ORAL
  Filled 2018-12-06: qty 2

## 2018-12-06 MED ORDER — IBUPROFEN 400 MG PO TABS
600.0000 mg | ORAL_TABLET | Freq: Four times a day (QID) | ORAL | Status: DC | PRN
  Administered 2018-12-06: 600 mg via ORAL
  Filled 2018-12-06: qty 1

## 2018-12-06 MED ORDER — HYDROCODONE-ACETAMINOPHEN 5-325 MG PO TABS
1.0000 | ORAL_TABLET | Freq: Once | ORAL | Status: AC
  Administered 2018-12-06: 1 via ORAL
  Filled 2018-12-06: qty 1

## 2018-12-06 MED ORDER — IPRATROPIUM-ALBUTEROL 0.5-2.5 (3) MG/3ML IN SOLN: 3.0000 mL | Freq: Two times a day (BID) | RESPIRATORY_TRACT | Status: DC

## 2018-12-06 MED ORDER — INSULIN ASPART 100 UNIT/ML ~~LOC~~ SOLN
0.0000 [IU] | Freq: Three times a day (TID) | SUBCUTANEOUS | Status: DC
  Administered 2018-12-06: 2 [IU] via SUBCUTANEOUS

## 2018-12-06 MED ORDER — IPRATROPIUM-ALBUTEROL 0.5-2.5 (3) MG/3ML IN SOLN
3.0000 mL | Freq: Four times a day (QID) | RESPIRATORY_TRACT | Status: DC
  Administered 2018-12-06 (×2): 3 mL via RESPIRATORY_TRACT
  Filled 2018-12-06 (×2): qty 3

## 2018-12-06 MED ORDER — ALBUTEROL SULFATE (2.5 MG/3ML) 0.083% IN NEBU
2.5000 mg | INHALATION_SOLUTION | RESPIRATORY_TRACT | Status: DC | PRN
  Administered 2018-12-06 – 2018-12-07 (×2): 2.5 mg via RESPIRATORY_TRACT
  Filled 2018-12-06 (×2): qty 3

## 2018-12-06 MED ORDER — INSULIN ASPART 100 UNIT/ML ~~LOC~~ SOLN: 0.0000 [IU] | Freq: Every day | SUBCUTANEOUS | Status: DC

## 2018-12-06 NOTE — Progress Notes (Signed)
Progress Note    Quincie Haroon  MLJ:[id number] DOB: 11-13-1966  DOA: 12/05/2018 PCP: Patient, No Pcp Per    Brief Narrative:     Medical records reviewed and are as summarized below:  Reona Zendejas is an 52 y.o. female with history of COPD who moved from [place] in 2014 has not been taking medications for last 4 years started having worsening shortness of breath last 2 weeks and started taking her Symbicort again last week despite which patient's shortness of breath has not been getting better.    Assessment/Plan:   Principal Problem:   Acute respiratory failure with hypoxia (HCC) Active Problems:   History of acoustic neuroma   COPD exacerbation (HCC)  Acute respiratory failure with hypoxia secondary to COPD exacerbation  - IV steroids  -PRN and scheduled nebulizer and Pulmicort -HIV negative -d dimer negative -O2 sat down to 88% with exertion -has PFTs years ago in [place]  Hypokalemia with muscle cramps -check Mg -replete PO  Previous history of tobacco abuse  - quit 4 years ago.    Family Communication/Anticipated D/C date and plan/Code Status   DVT prophylaxis: Lovenox ordered. Code Status: Full Code.  Family Communication: sister at bedside Disposition Plan:    Medical Consultants:       Subjective:   Asking about disability for her breathing issues  Objective:    Vitals:   12/06/18 0548 12/06/18 0816 12/06/18 1251 12/06/18 1351  BP: 126/69  (!) 139/92   Pulse: (!) 108  (!) 108   Resp: 19     Temp: 98 F (36.7 C)  97.7 F (36.5 C)   TempSrc: Oral  Oral   SpO2: 90% 91% 94% 94%  Weight:      Height:        Intake/Output Summary (Last 24 hours) at 12/06/2018 1539 Last data filed at 12/06/2018 1300 Gross per 24 hour  Intake 360 ml  Output -  Net 360 ml   Filed Weights   12/05/18 1819  Weight: 74.8 kg    Exam: In bed, NAD Not moving much air, no wheezing but few rales No LE edema Mood and affect normal A+Ox3 rrr No  rashes or lesions  Data Reviewed:   I have personally reviewed following labs and imaging studies:  Labs: Labs show the following:   Basic Metabolic Panel: Recent Labs  Lab 12/05/18 1833 12/06/18 0000  NA 141 140  K 4.3 3.1*  CL 103 105  CO2 27 22  GLUCOSE 97 328*  BUN 8 12  CREATININE 0.97 1.28*  CALCIUM 9.6 9.1  MG  --  1.7   GFR Estimated Creatinine Clearance: 54.3 mL/min (A) (by C-G formula based on SCr of 1.28 mg/dL (H)). Liver Function Tests: No results for input(s): AST, ALT, ALKPHOS, BILITOT, PROT, ALBUMIN in the last 168 hours. No results for input(s): LIPASE, AMYLASE in the last 168 hours. No results for input(s): AMMONIA in the last 168 hours. Coagulation profile No results for input(s): INR, PROTIME in the last 168 hours.  CBC: Recent Labs  Lab 12/05/18 1833 12/06/18 0000  WBC 8.1 8.9  HGB 15.5* 14.3  HCT 47.6* 43.8  MCV 94.6 94.6  PLT 191 169   Cardiac Enzymes: No results for input(s): CKTOTAL, CKMB, CKMBINDEX, TROPONINI in the last 168 hours. BNP (last 3 results) No results for input(s): PROBNP in the last 8760 hours. CBG: No results for input(s): GLUCAP in the last 168 hours. D-Dimer: Recent Labs    12/05/18 2339  DDIMER 0.41   Hgb A1c: No results for input(s): HGBA1C in the last 72 hours. Lipid Profile: No results for input(s): CHOL, HDL, LDLCALC, TRIG, CHOLHDL, LDLDIRECT in the last 72 hours. Thyroid function studies: Recent Labs    12/05/18 2338-04-12  TSH 0.537   Anemia work up: No results for input(s): VITAMINB12, FOLATE, FERRITIN, TIBC, IRON, RETICCTPCT in the last 72 hours. Sepsis Labs: Recent Labs  Lab 12/05/18 1833 12/06/18 0000  WBC 8.1 8.9    Microbiology No results found for this or any previous visit (from the past 240 hour(s)).  Procedures and diagnostic studies:  Ct Head Wo Contrast  Result Date: 12/06/2018 CLINICAL DATA:  Left-sided headache EXAM: CT HEAD WITHOUT CONTRAST TECHNIQUE: Contiguous axial images  were obtained from the base of the skull through the vertex without intravenous contrast. COMPARISON:  None. FINDINGS: Brain: There is no mass, hemorrhage or extra-axial collection. The size and configuration of the ventricles and extra-axial CSF spaces are normal. The brain parenchyma is normal, without evidence of acute or chronic infarction. Vascular: No abnormal hyperdensity of the major intracranial arteries or dural venous sinuses. No intracranial atherosclerosis. Skull: The visualized skull base, calvarium and extracranial soft tissues are normal. Sinuses/Orbits: No fluid levels or advanced mucosal thickening of the visualized paranasal sinuses. No mastoid or middle ear effusion. The orbits are normal. IMPRESSION: Normal head CT. Electronically Signed   By: Ulyses Jarred M.D.   On: 12/06/2018 00:17   Dg Chest Portable 1 View  Result Date: 12/05/2018 CLINICAL DATA:  Shortness of breath EXAM: PORTABLE CHEST 1 VIEW COMPARISON:  None. FINDINGS: Cardiac shadow is within normal limits. The lungs are hyperinflated bilaterally. No focal infiltrate or sizable effusion are seen. No bony abnormality is noted. IMPRESSION: COPD without acute abnormality. Electronically Signed   By: Inez Catalina M.D.   On: 12/05/2018 19:31    Medications:   . budesonide (PULMICORT) nebulizer solution  0.25 mg Nebulization BID  . enoxaparin (LOVENOX) injection  40 mg Subcutaneous Q24H  . insulin aspart  0-5 Units Subcutaneous QHS  . insulin aspart  0-9 Units Subcutaneous TID WC  . ipratropium-albuterol  3 mL Nebulization Q6H  . mouth rinse  15 mL Mouth Rinse BID  . metFORMIN  500 mg Oral BID WC  . methylPREDNISolone (SOLU-MEDROL) injection  40 mg Intravenous Q6H  . pantoprazole  40 mg Oral Daily   Continuous Infusions:   LOS: 0 days   Geradine Girt  [facility]   *Please refer to [email or web address], password [email or web address] to get updated schedule on who will round on this patient, as hospitalists switch teams weekly. If  7PM-7AM, please contact night-coverage at [email or web address], password [email or web address] for any overnight needs.  12/06/2018, 3:39 PM

## 2018-12-06 NOTE — Progress Notes (Signed)
Pt educated for need to use oxygen therapy. Pt desats to 87-88% on RA w/movement. Nasal cannula on 2L placed and patient and patient constantly removes Nasal cannula off face.

## 2018-12-06 NOTE — Progress Notes (Signed)
SATURATION QUALIFICATIONS: (This note is used to comply with regulatory documentation for home oxygen)  Patient Saturations on Room Air at Rest = 92%  Patient Saturations on Room Air while Ambulating = 88%  Patient Saturations on 2 Liters of oxygen while Ambulating = 93%  Please briefly explain why patient needs home oxygen: patients 02 levels increased with oxygen and patient states " I feel a lot better with oxygen"

## 2018-12-06 NOTE — Progress Notes (Signed)
Inpatient Diabetes Program Recommendations  AACE/ADA: New Consensus Statement on Inpatient Glycemic Control (2019)  Target Ranges:  Prepandial:   less than 140 mg/dL      Peak postprandial:   less than 180 mg/dL (1-2 hours)      Critically ill patients:  140 - 180 mg/dL   Results for Madison Phillips, Madison Phillips (MRN [id number]) as of 12/06/2018 09:30  Ref. Range 12/05/2018 18:33 12/06/2018 00:00  Glucose Latest Ref Range: 70 - 99 mg/dL 97 328 (H)   Review of Glycemic Control  Diabetes history: No Outpatient Diabetes medications: NA Current orders for Inpatient glycemic control: None  Inpatient Diabetes Program Recommendations: Correction (SSI): While inpatient and ordered steroids, please consider ordering CBGs with Novolog correction scale ACHS.  Thanks, Barnie Alderman, RN, MSN, CDE Diabetes Coordinator Inpatient Diabetes Program [phone] (Team Pager from 8am to 5pm)

## 2018-12-07 DIAGNOSIS — J9601 Acute respiratory failure with hypoxia: Secondary | ICD-10-CM | POA: Diagnosis not present

## 2018-12-07 DIAGNOSIS — R739 Hyperglycemia, unspecified: Secondary | ICD-10-CM | POA: Diagnosis not present

## 2018-12-07 DIAGNOSIS — J441 Chronic obstructive pulmonary disease with (acute) exacerbation: Secondary | ICD-10-CM | POA: Diagnosis not present

## 2018-12-07 LAB — BASIC METABOLIC PANEL
Anion gap: 12 (ref 5–15)
BUN: 15 mg/dL (ref 6–20)
CO2: 25 mmol/L (ref 22–32)
Calcium: 9.7 mg/dL (ref 8.9–10.3)
Chloride: 105 mmol/L (ref 98–111)
Creatinine, Ser: 1.01 mg/dL — ABNORMAL HIGH (ref 0.44–1.00)
GFR calc Af Amer: >60 mL/min (ref >60)
Glucose, Bld: 102 mg/dL — ABNORMAL HIGH (ref 70–99)
Potassium: 4.2 mmol/L (ref 3.5–5.1)
Sodium: 142 mmol/L (ref 135–145)

## 2018-12-07 LAB — GLUCOSE, CAPILLARY
Glucose-Capillary: 89 mg/dL (ref 70–99)
Glucose-Capillary: 104 mg/dL — ABNORMAL HIGH (ref 70–99)

## 2018-12-07 LAB — CBC
HCT: 46.1 % — ABNORMAL HIGH (ref 36.0–46.0)
Hemoglobin: 14.9 g/dL (ref 12.0–15.0)
MCH: 30.6 pg (ref 26.0–34.0)
MCHC: 32.3 g/dL (ref 30.0–36.0)
MCV: 94.7 fL (ref 80.0–100.0)
PLATELETS: 211 10*3/uL (ref 150–400)
RBC: 4.87 MIL/uL (ref 3.87–5.11)
RDW: 13 % (ref 11.5–15.5)
WBC: 23 10*3/uL — ABNORMAL HIGH (ref 4.0–10.5)
nRBC: 0 % (ref 0.0–0.2)

## 2018-12-07 MED ORDER — METFORMIN HCL 500 MG PO TABS: 500.0000 mg | ORAL_TABLET | Freq: Every day | ORAL | 0 refills | Status: DC

## 2018-12-07 MED ORDER — GABAPENTIN 600 MG PO TABS
300.0000 mg | ORAL_TABLET | Freq: Three times a day (TID) | ORAL | Status: DC
  Administered 2018-12-07: 300 mg via ORAL
  Filled 2018-12-07: qty 1

## 2018-12-07 MED ORDER — ALBUTEROL SULFATE (2.5 MG/3ML) 0.083% IN NEBU: 2.5000 mg | INHALATION_SOLUTION | RESPIRATORY_TRACT | 12 refills | Status: DC | PRN

## 2018-12-07 MED ORDER — ALBUTEROL SULFATE HFA 108 (90 BASE) MCG/ACT IN AERS: 1.0000 | INHALATION_SPRAY | Freq: Four times a day (QID) | RESPIRATORY_TRACT | 0 refills | Status: DC | PRN

## 2018-12-07 MED ORDER — PANTOPRAZOLE SODIUM 40 MG PO TBEC: 40.0000 mg | DELAYED_RELEASE_TABLET | Freq: Every day | ORAL | 0 refills | Status: DC

## 2018-12-07 MED ORDER — FLUTICASONE FUROATE-VILANTEROL 100-25 MCG/INH IN AEPB: 1.0000 | INHALATION_SPRAY | Freq: Every day | RESPIRATORY_TRACT | 0 refills | Status: DC

## 2018-12-07 MED ORDER — PREDNISONE 10 MG PO TABS: ORAL_TABLET | ORAL | 0 refills | Status: DC

## 2018-12-07 MED ORDER — PREDNISONE 20 MG PO TABS: 40.0000 mg | ORAL_TABLET | Freq: Every day | ORAL | Status: DC

## 2018-12-07 MED ORDER — IPRATROPIUM-ALBUTEROL 0.5-2.5 (3) MG/3ML IN SOLN
3.0000 mL | Freq: Three times a day (TID) | RESPIRATORY_TRACT | Status: DC
  Administered 2018-12-07: 3 mL via RESPIRATORY_TRACT
  Filled 2018-12-07: qty 3

## 2018-12-07 MED ORDER — GABAPENTIN 600 MG PO TABS: 300.0000 mg | ORAL_TABLET | Freq: Three times a day (TID) | ORAL | 0 refills | Status: DC

## 2018-12-07 MED ORDER — IPRATROPIUM-ALBUTEROL 0.5-2.5 (3) MG/3ML IN SOLN: 3.0000 mL | Freq: Two times a day (BID) | RESPIRATORY_TRACT | Status: DC

## 2018-12-07 MED FILL — ALBUTEROL SUL 2.5 MG/3 ML S: (2.5 MG/3ML | 5 days supply | Qty: 75 | Fill #0

## 2018-12-07 MED FILL — PANTOPRAZOLE SOD DR 40 MG T: 40 | 14 days supply | Qty: 14 | Fill #0

## 2018-12-07 MED FILL — predniSONE 10 MG TABS: 10 | 8 days supply | Qty: 20 | Fill #0

## 2018-12-07 MED FILL — GABAPENTIN 600 MG TABLET: 600 | 30 days supply | Qty: 45 | Fill #0

## 2018-12-07 MED FILL — metFORMIN HCL 500 MG TABS: 500 | 15 days supply | Qty: 15 | Fill #0

## 2018-12-07 MED FILL — !VENTOLIN HFA INHALER: 108 (90 BAS | 25 days supply | Qty: 18 | Fill #0

## 2018-12-07 MED FILL — !BREO ELLIPTA 100-25 MCG IN: 100-25 | 30 days supply | Qty: 60 | Fill #0

## 2018-12-07 NOTE — Care Management Note (Signed)
Case Management Note  Patient Details  Name: Madison Phillips MRN: [id number] Date of Birth: [birth year]  Subjective/Objective: 52 yo female presented with SOB.                   Action/Plan: CM met with patient to discuss transitional needs. Patient lives at home, independent with ADLs PTA. Patient states being followed by [facility] for her PCP and Rx needs. Home nebulizer ordered, with [facility] selected. DME referral given to [place], [facility] liaison; AVS updated. No further needs from CM.   Expected Discharge Date:  12/07/18               Expected Discharge Plan:  Home/Self Care  In-House Referral:  Financial Counselor  Discharge planning Services  CM Consult, Medication Assistance  Post Acute Care Choice:  Durable Medical Equipment Choice offered to:  Patient  DME Arranged:  Nebulizer/meds DME Agency:  [place]:  NA [place] Agency:  NA  Status of Service:  Completed, signed off  If discussed at [place] of Stay Meetings, dates discussed:    Additional Comments:  Midge Minium RN, BSN, NCM-BC, ACM-RN [phone] 12/07/2018, 3:31 PM

## 2018-12-07 NOTE — Progress Notes (Signed)
Pt alert and oriented in NAD. Pt verbalized understanding of d/c instructions.  

## 2018-12-07 NOTE — Discharge Summary (Addendum)
Physician Discharge Summary  Madison Phillips CNO:[id number] DOB: 1966-02-01 DOA: 12/05/2018  PCP: Patient, No Pcp Per  Admit date: 12/05/2018 Discharge date: 12/07/2018  Admitted From: home Discharge disposition: home   Recommendations for Outpatient Follow-Up:   1. Patient to bring in PFTs from [place] to have on record and she will need repeat PFTs as well 2. Referral to Dr. Joya Gaskins for pulm follow up 3. While on steroids will give metformin and will need close dietary changes to avoid hyperglycemia 4. Re-assess BP once off steroids   Discharge Diagnosis:   Principal Problem:   Acute respiratory failure with hypoxia (HCC) Active Problems:   History of acoustic neuroma   COPD exacerbation ([place])    Discharge Condition: Improved.  Diet recommendation: Low sodium, heart healthy.  Carbohydrate-modified  Wound care: None.  Code status: Full.   History of Present Illness:   Madison Phillips is a 52 y.o. female with history of COPD who moved from [place] in 2014 has not been taking medications for last 4 years started having worsening shortness of breath last 2 weeks and started taking her Symbicort again last week despite which patient's shortness of breath has not been getting better.  Patient has been getting episodic palpitations 2.  Denies any chest pain.  Has some crampiness of the left lower extremity from varicose veins.  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  Denies any weakness of the extremities.  ED Course: In the ER patient is found to be wheezing short of breath chest x-ray showing features consistent with COPD EKG showing sinus rhythm with possible LVH and left atrial enlargement.  Patient was placed on nebulizer treatment IV steroids and admitted for acute respiratory failure with hypoxia secondary to COPD exacerbation.   Hospital Course by Problem:     Acute respiratory  failure with hypoxia secondary to COPD exacerbation  - IV steroids weaned to PO taper (PPI while on steroids) -PRN and scheduled nebulizer  -HIV negative -d dimer negative -has PFTs years ago in [place]-- will need repeat Nebulizer ordered with nebs-- added long acting inhaler but once PFTs obtained, will need medications changed and may need CT Scan of lungs -suspect O2 sats stay in the low 90s most of the time-- here in hospital very comfortable appearing with O2 sats around 92%  Hypokalemia with muscle cramps -replaced  Previous history of tobacco abuse  - quit 4 years ago.  H/o acoustic neuroma -was on Neurontin prior to move -will re-start   Medical Consultants:      Discharge Exam:   Vitals:   12/07/18 0839 12/07/18 1207  BP:  (!) 164/98  Pulse:  70  Resp:  18  Temp:  (!) 97.3 F (36.3 C)  SpO2: 96% 94%   Vitals:   12/06/18 2300 12/07/18 0530 12/07/18 0839 12/07/18 1207  BP: (!) 142/91 (!) 149/105  (!) 164/98  Pulse: 82 78  70  Resp: 20 18  18   Temp: 98.1 F (36.7 C) 97.7 F (36.5 C)  (!) 97.3 F (36.3 C)  TempSrc: Oral Oral  Oral  SpO2: 92% 95% 96% 94%  Weight:      Height:        General exam: Appears calm and comfortable. Not moving much air but wheezing has stopped-- this is probably patient's baseline-- suspect she has chronically low O2 at rest/with exertion.  Appears very comfortable walking in the  hallway  The results of significant diagnostics from this hospitalization (including imaging, microbiology, ancillary and laboratory) are listed below for reference.     Procedures and Diagnostic Studies:   Ct Head Wo Contrast  Result Date: 12/06/2018 CLINICAL DATA:  Left-sided headache EXAM: CT HEAD WITHOUT CONTRAST TECHNIQUE: Contiguous axial images were obtained from the base of the skull through the vertex without intravenous contrast. COMPARISON:  None. FINDINGS: Brain: There is no mass, hemorrhage or extra-axial collection. The size and  configuration of the ventricles and extra-axial CSF spaces are normal. The brain parenchyma is normal, without evidence of acute or chronic infarction. Vascular: No abnormal hyperdensity of the major intracranial arteries or dural venous sinuses. No intracranial atherosclerosis. Skull: The visualized skull base, calvarium and extracranial soft tissues are normal. Sinuses/Orbits: No fluid levels or advanced mucosal thickening of the visualized paranasal sinuses. No mastoid or middle ear effusion. The orbits are normal. IMPRESSION: Normal head CT. Electronically Signed   By: Ulyses Jarred M.D.   On: 12/06/2018 00:17   Dg Chest Portable 1 View  Result Date: 12/05/2018 CLINICAL DATA:  Shortness of breath EXAM: PORTABLE CHEST 1 VIEW COMPARISON:  None. FINDINGS: Cardiac shadow is within normal limits. The lungs are hyperinflated bilaterally. No focal infiltrate or sizable effusion are seen. No bony abnormality is noted. IMPRESSION: COPD without acute abnormality. Electronically Signed   By: Inez Catalina M.D.   On: 12/05/2018 19:31     Labs:   Basic Metabolic Panel: Recent Labs  Lab 12/05/18 1833 12/06/18 0000 12/07/18 0532  NA 141 140 142  K 4.3 3.1* 4.2  CL 103 105 105  CO2 27 22 25   GLUCOSE 97 328* 102*  BUN 8 12 15   CREATININE 0.97 1.28* 1.01*  CALCIUM 9.6 9.1 9.7  MG  --  1.7  --    GFR Estimated Creatinine Clearance: 68.9 mL/min (A) (by C-G formula based on SCr of 1.01 mg/dL (H)). Liver Function Tests: No results for input(s): AST, ALT, ALKPHOS, BILITOT, PROT, ALBUMIN in the last 168 hours. No results for input(s): LIPASE, AMYLASE in the last 168 hours. No results for input(s): AMMONIA in the last 168 hours. Coagulation profile No results for input(s): INR, PROTIME in the last 168 hours.  CBC: Recent Labs  Lab 12/05/18 1833 12/06/18 0000 12/07/18 0532  WBC 8.1 8.9 23.0*  HGB 15.5* 14.3 14.9  HCT 47.6* 43.8 46.1*  MCV 94.6 94.6 94.7  PLT 191 169 211   Cardiac  Enzymes: No results for input(s): CKTOTAL, CKMB, CKMBINDEX, TROPONINI in the last 168 hours. BNP: Invalid input(s): POCBNP CBG: Recent Labs  Lab 12/06/18 1651 12/06/18 2108 12/07/18 0642 12/07/18 1116  GLUCAP 153* 129* 89 104*   D-Dimer Recent Labs    12/05/18 2339  DDIMER 0.41   Hgb A1c No results for input(s): HGBA1C in the last 72 hours. Lipid Profile No results for input(s): CHOL, HDL, LDLCALC, TRIG, CHOLHDL, LDLDIRECT in the last 72 hours. Thyroid function studies Recent Labs    12/05/18 2339  TSH 0.537   Anemia work up No results for input(s): VITAMINB12, FOLATE, FERRITIN, TIBC, IRON, RETICCTPCT in the last 72 hours. Microbiology No results found for this or any previous visit (from the past 240 hour(s)).   Discharge Instructions:   Discharge Instructions    Diet - low sodium heart healthy   Complete by:  As directed    Diet Carb Modified   Complete by:  As directed    Discharge instructions   Complete by:  As directed    Will need outpatient PFTs-- bring the copy of your old PFTs to your next appointment   Increase activity slowly   Complete by:  As directed      Allergies as of 12/07/2018   No Known Allergies     Medication List    STOP taking these medications   tiotropium 18 MCG inhalation capsule Commonly known as:  SPIRIVA     TAKE these medications   albuterol (2.5 MG/3ML) 0.083% nebulizer solution Commonly known as:  PROVENTIL Take 3 mLs (2.5 mg total) by nebulization every 2 (two) hours as needed for wheezing or shortness of breath.   albuterol 108 (90 Base) MCG/ACT inhaler Commonly known as:  PROVENTIL HFA;VENTOLIN HFA Inhale 1-2 puffs into the lungs every 6 (six) hours as needed for wheezing or shortness of breath.   fluticasone furoate-vilanterol 100-25 MCG/INH Aepb Commonly known as:  BREO ELLIPTA Inhale 1 puff into the lungs daily.   gabapentin 600 MG tablet Commonly known as:  NEURONTIN Take 0.5 tablets (300 mg total) by  mouth 3 (three) times daily.   metFORMIN 500 MG tablet Commonly known as:  GLUCOPHAGE Take 1 tablet (500 mg total) by mouth daily with breakfast.   pantoprazole 40 MG tablet Commonly known as:  PROTONIX Take 1 tablet (40 mg total) by mouth daily. Start taking on:  December 08, 2018   predniSONE 10 MG tablet Commonly known as:  DELTASONE 40 mg x 2 days then 30 mg x 2 days then 20 mg x2 days then 10 mg x 2 days then stop            Durable Medical Equipment  (From admission, onward)         Start     Ordered   12/07/18 1504  For home use only DME Nebulizer machine  Once    Question:  Patient needs a nebulizer to treat with the following condition  Answer:  COPD (chronic obstructive pulmonary disease) ([place])   12/07/18 1503         Follow-up [facility] Follow up.   Contact information: [address] [place] [address] [phone]           Time coordinating discharge: 25 min  Signed:  Geradine Girt DO  [facility] 12/07/2018, 3:22 PM

## 2018-12-07 NOTE — Progress Notes (Signed)
SATURATION QUALIFICATIONS: (This note is used to comply with regulatory documentation for home oxygen)  Patient Saturations on Room Air at Rest = 90-92%  Patient Saturations on Room Air while Ambulating = 88-95%

## 2018-12-22 ENCOUNTER — Emergency Department (HOSPITAL_COMMUNITY): Payer: Medicaid Other

## 2018-12-22 ENCOUNTER — Emergency Department (HOSPITAL_COMMUNITY)
Admission: EM | Admit: 2018-12-22 | Discharge: 2018-12-22 | Disposition: A | Discharge status: Home / Self Care | Payer: Medicaid Other | Attending: Emergency Medicine | Admitting: Emergency Medicine

## 2018-12-22 DIAGNOSIS — Z79899 Other long term (current) drug therapy: Secondary | ICD-10-CM | POA: Diagnosis not present

## 2018-12-22 DIAGNOSIS — R0602 Shortness of breath: Secondary | ICD-10-CM | POA: Diagnosis present

## 2018-12-22 DIAGNOSIS — J441 Chronic obstructive pulmonary disease with (acute) exacerbation: Secondary | ICD-10-CM | POA: Insufficient documentation

## 2018-12-22 DIAGNOSIS — Z87891 Personal history of nicotine dependence: Secondary | ICD-10-CM | POA: Diagnosis not present

## 2018-12-22 DIAGNOSIS — Z7984 Long term (current) use of oral hypoglycemic drugs: Secondary | ICD-10-CM | POA: Insufficient documentation

## 2018-12-22 LAB — BASIC METABOLIC PANEL
Anion gap: 7 (ref 5–15)
BUN: 10 mg/dL (ref 6–20)
CALCIUM: 9.4 mg/dL (ref 8.9–10.3)
CO2: 27 mmol/L (ref 22–32)
Chloride: 107 mmol/L (ref 98–111)
Creatinine, Ser: 0.99 mg/dL (ref 0.44–1.00)
GFR calc Af Amer: >60 mL/min (ref >60)
GFR calc non Af Amer: >60 mL/min (ref >60)
Glucose, Bld: 113 mg/dL — ABNORMAL HIGH (ref 70–99)
Potassium: 4.1 mmol/L (ref 3.5–5.1)
Sodium: 141 mmol/L (ref 135–145)

## 2018-12-22 LAB — INFLUENZA PANEL BY PCR (TYPE A & B)
Influenza A By PCR: NEGATIVE
Influenza B By PCR: NEGATIVE

## 2018-12-22 LAB — CBC WITH DIFFERENTIAL/PLATELET
Abs Immature Granulocytes: 0.03 10*3/uL (ref 0.00–0.07)
Basophils Absolute: 0 10*3/uL (ref 0.0–0.1)
Basophils Relative: 1 %
Eosinophils Absolute: 0.1 10*3/uL (ref 0.0–0.5)
Eosinophils Relative: 1 %
HCT: 45.9 % (ref 36.0–46.0)
Hemoglobin: 14.7 g/dL (ref 12.0–15.0)
Immature Granulocytes: 0 %
Lymphocytes Relative: 16 %
Lymphs Abs: 1.1 10*3/uL (ref 0.7–4.0)
MCH: 30.3 pg (ref 26.0–34.0)
MCHC: 32 g/dL (ref 30.0–36.0)
MCV: 94.6 fL (ref 80.0–100.0)
MONO ABS: 0.8 10*3/uL (ref 0.1–1.0)
Monocytes Relative: 11 %
Neutro Abs: 4.9 10*3/uL (ref 1.7–7.7)
Neutrophils Relative %: 71 %
Platelets: 175 10*3/uL (ref 150–400)
RBC: 4.85 MIL/uL (ref 3.87–5.11)
RDW: 12.5 % (ref 11.5–15.5)
WBC: 6.9 10*3/uL (ref 4.0–10.5)
nRBC: 0 % (ref 0.0–0.2)

## 2018-12-22 LAB — BRAIN NATRIURETIC PEPTIDE: B Natriuretic Peptide: 16.5 pg/mL (ref 0.0–100.0)

## 2018-12-22 LAB — I-STAT TROPONIN, ED: Troponin i, poc: 0 ng/mL (ref 0.00–0.08)

## 2018-12-22 MED ORDER — BENZONATATE 100 MG PO CAPS: 100.0000 mg | ORAL_CAPSULE | Freq: Three times a day (TID) | ORAL | 0 refills | Status: DC

## 2018-12-22 MED ORDER — BENZONATATE 100 MG PO CAPS
100.0000 mg | ORAL_CAPSULE | Freq: Once | ORAL | Status: AC
  Administered 2018-12-22: 100 mg via ORAL
  Filled 2018-12-22: qty 1

## 2018-12-22 MED ORDER — IPRATROPIUM-ALBUTEROL 0.5-2.5 (3) MG/3ML IN SOLN
3.0000 mL | Freq: Once | RESPIRATORY_TRACT | Status: AC
  Administered 2018-12-22: 3 mL via RESPIRATORY_TRACT
  Filled 2018-12-22: qty 3

## 2018-12-22 MED ORDER — PREDNISONE 10 MG PO TABS: ORAL_TABLET | ORAL | 0 refills | Status: DC

## 2018-12-22 MED ORDER — PREDNISONE 20 MG PO TABS
60.0000 mg | ORAL_TABLET | Freq: Once | ORAL | Status: AC
  Administered 2018-12-22: 60 mg via ORAL
  Filled 2018-12-22: qty 3

## 2018-12-22 NOTE — ED Notes (Signed)
Pt alert and oriented in NAD. Pt verbalized understanding of discharge instructions. 

## 2018-12-22 NOTE — ED Triage Notes (Signed)
Pt here with c/o sob and chest pain , pt states that she feels the same as before , sats today 92 %

## 2018-12-22 NOTE — ED Provider Notes (Signed)
[facility] EMERGENCY DEPARTMENT Provider Note   CSN: [id number] Arrival date & time: 12/22/18  1056     History   Chief Complaint No chief complaint on file.   HPI Madison Phillips is a 53 y.o. female.  The history is provided by the patient and medical records. No language interpreter was used.     53 year old female with history of COPD presenting to the ED for evaluation of shortness of breath.  Patient report she developed a COPD exacerbation 3 weeks ago when she was admitted to the hospital for several days and subsequently discharged.  For the past 2 days she developed cold symptoms including sinus congestion, sneezing, coughing, increased wheezing, having shortness of breath and having chills.  Symptoms moderate in severity, she tries using her inhaler and nebulizer at home without adequate relief.  She feels that her lungs really tight.  She has not had a flu shot.  She denies any hemoptysis, nausea, or vomiting.  She does endorse some loose stools.  No complaint of abdominal pain or dysuria or rash.  Past Medical History:  Diagnosis Date  . COPD (chronic obstructive pulmonary disease) ([place])   . 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 Left ear hearing loss 12/05/2018    Patient Active Problem List   Diagnosis Date Noted  . Left ear hearing loss 12/05/2018  . 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 Varicose veins of left lower extremity 12/05/2018  . Acute respiratory failure with hypoxia ([place]) 12/05/2018  . COPD exacerbation ([place]) 12/05/2018    Past Surgical History:  Procedure Laterality Date  . cyber knife    . ectopic pregnanacy       OB History   No obstetric history on file.      Home Medications    Prior to Admission medications   Medication Sig Start Date End Date Taking? Authorizing Provider  albuterol (PROVENTIL HFA;VENTOLIN HFA) 108 (90 Base) MCG/ACT inhaler Inhale 1-2 puffs into the lungs every 6 (six) hours as needed for  wheezing or shortness of breath. 12/07/18   Geradine Girt, DO  albuterol (PROVENTIL) (2.5 MG/3ML) 0.083% nebulizer solution Take 3 mLs (2.5 mg total) by nebulization every 2 (two) hours as needed for wheezing or shortness of breath. 12/07/18   Geradine Girt, DO  fluticasone furoate-vilanterol (BREO ELLIPTA) 100-25 MCG/INH AEPB Inhale 1 puff into the lungs daily. 12/07/18   Geradine Girt, DO  gabapentin (NEURONTIN) 600 MG tablet Take 0.5 tablets (300 mg total) by mouth 3 (three) times daily. 12/07/18   Geradine Girt, DO  metFORMIN (GLUCOPHAGE) 500 MG tablet Take 1 tablet (500 mg total) by mouth daily with breakfast. 12/07/18   Geradine Girt, DO  pantoprazole (PROTONIX) 40 MG tablet Take 1 tablet (40 mg total) by mouth daily. 12/08/18   Geradine Girt, DO  predniSONE (DELTASONE) 10 MG tablet 40 mg x 2 days then 30 mg x 2 days then 20 mg x2 days then 10 mg x 2 days then stop 12/07/18   Geradine Girt, DO    Family History Family History  Problem Relation Age of Onset  . Hypothyroidism Mother   . Diabetes Mellitus II Maternal Aunt     Social History Social History   Tobacco Use  . Smoking status: Former Research scientist (life sciences)  . Smokeless tobacco: Never Used  Substance Use Topics  . Alcohol use: Yes    Alcohol/week: 2.0 - 3.0 standard drinks    Types: 2 - 3  Cans of beer per week    Comment: a night  . Drug use: Not Currently     Allergies   Patient has no known allergies.   Review of Systems Review of Systems  All other systems reviewed and are negative.    Physical Exam Updated Vital Signs BP (!) 160/103 (BP Location: Right Arm)   Pulse 97   Temp 98.1 F (36.7 C) (Oral)   Resp 20   SpO2 95%   Physical Exam Vitals signs and nursing note reviewed.  Constitutional:      General: She is not in acute distress.    Appearance: She is well-developed.  HENT:     Head: Atraumatic.     Right Ear: Tympanic membrane normal.     Left Ear: Tympanic membrane normal.     Nose: Nose  normal.     Mouth/Throat:     Mouth: Mucous membranes are moist.  Eyes:     Conjunctiva/sclera: Conjunctivae normal.  Neck:     Musculoskeletal: Neck supple. No neck rigidity.  Cardiovascular:     Rate and Rhythm: Normal rate and regular rhythm.  Pulmonary:     Breath sounds: Wheezing present.     Comments: Decreased breath sounds with expiratory wheezes Abdominal:     Palpations: Abdomen is soft.     Tenderness: There is no abdominal tenderness.  Musculoskeletal:        General: No swelling.  Lymphadenopathy:     Cervical: No cervical adenopathy.  Skin:    Findings: No rash.  Neurological:     Mental Status: She is alert and oriented to person, place, and time.  Psychiatric:        Mood and Affect: Mood normal.      ED Treatments / Results  Labs (all labs ordered are listed, but only abnormal results are displayed) Labs Reviewed  BASIC METABOLIC PANEL - Abnormal; Notable for the following components:      Result Value   Glucose, Bld 113 (*)    All other components within normal limits  CBC WITH DIFFERENTIAL/PLATELET  BRAIN NATRIURETIC PEPTIDE  INFLUENZA PANEL BY PCR (TYPE A & B)  I-STAT TROPONIN, ED    EKG EKG Interpretation  Date/Time:  Friday December 22 2018 16:51:20 EST Ventricular Rate:  98 PR Interval:  142 QRS Duration: 83 QT Interval:  355 QTC Calculation: 454 R Axis:   70 Text Interpretation:  Sinus rhythm Probable left atrial enlargement Probable anteroseptal infarct, old no significant change since earlier in the day Confirmed by Sherwood Gambler [phone]) on 12/22/2018 4:53:22 PM   Radiology Dg Chest 2 View  Result Date: 12/22/2018 CLINICAL DATA:  Shortness of breath.  Cough. EXAM: CHEST - 2 VIEW COMPARISON:  12/05/2018. FINDINGS: Mediastinum and hilar structures normal. Mild bibasilar subsegmental atelectasis and or scarring. EKG leads are noted the patient. No pleural effusion or pneumothorax. No acute bony abnormality. IMPRESSION: Mild bibasilar  subsegmental atelectasis and or scarring. Electronically Signed   By: Marcello Moores  Register   On: 12/22/2018 12:43    Procedures Procedures (including critical care time)  Medications Ordered in ED Medications  predniSONE (DELTASONE) tablet 60 mg (60 mg Oral Given 12/22/18 1736)  ipratropium-albuterol (DUONEB) 0.5-2.5 (3) MG/3ML nebulizer solution 3 mL (3 mLs Nebulization Given 12/22/18 1737)  benzonatate (TESSALON) capsule 100 mg (100 mg Oral Given 12/22/18 1736)  ipratropium-albuterol (DUONEB) 0.5-2.5 (3) MG/3ML nebulizer solution 3 mL (3 mLs Nebulization Given 12/22/18 1816)     Initial Impression / Assessment and Plan /  ED Course  I have reviewed the triage vital signs and the nursing notes.  Pertinent labs & imaging results that were available during my care of the patient were reviewed by me and considered in my medical decision making (see chart for details).     BP (!) 160/103 (BP Location: Right Arm)   Pulse 97   Temp 98.1 F (36.7 C) (Oral)   Resp 20   SpO2 95%    Final Clinical Impressions(s) / ED Diagnoses   Final diagnoses:  COPD exacerbation [facility])    ED Discharge Orders         Ordered    predniSONE (DELTASONE) 10 MG tablet     12/22/18 1841    benzonatate (TESSALON) 100 MG capsule  Every 8 hours     12/22/18 1841         5:02 PM Patient with history of COPD presenting complaint shortness of breath and chest tightness.  Symptoms suggestive of COPD.  She also endorsed flulike symptoms, will obtain flu test, will provide breathing treatment, will monitor closely.  Initial chest x-ray shows mild bibasilar subsegmental atelectasis and/or scarring without evidence of pneumonia.  6:40 PM After receiving 2 DuoNeb's and prednisone, patient reports she feels better.  When ambulate, she maintained oxygen above 90%.  Labs are reassuring.  On reexamination, improved aeration of her lung bed she would benefit from further breathing treatment.  Patient however request to be  discharged.  She mentioned she has nebulizer machine at home that she can use.  She does not think she would like to stay for additional treatment.  She understands she can return promptly if her shortness of breath worsen.  Patient discharged home with steroid and cough medication.   Domenic Moras, PA-C 12/22/18 1842    Sherwood Gambler, MD 12/22/18 [phone]

## 2018-12-22 NOTE — ED Notes (Addendum)
Ambulated pt with pulse oximetry.  SpO2 did not drop below 90.  Pt denied shortness of breath.

## 2018-12-24 ENCOUNTER — Other Ambulatory Visit: Payer: Self-pay

## 2018-12-24 ENCOUNTER — Observation Stay (HOSPITAL_COMMUNITY)
Admission: EM | Admit: 2018-12-24 | Discharge: 2018-12-25 | Disposition: A | Discharge status: Home / Self Care | Payer: Medicaid Other | Attending: Internal Medicine | Admitting: Internal Medicine

## 2018-12-24 ENCOUNTER — Encounter (HOSPITAL_COMMUNITY): Payer: Self-pay | Admitting: *Deleted

## 2018-12-24 ENCOUNTER — Emergency Department (HOSPITAL_COMMUNITY): Payer: Medicaid Other

## 2018-12-24 DIAGNOSIS — J439 Emphysema, unspecified: Secondary | ICD-10-CM

## 2018-12-24 DIAGNOSIS — Z87891 Personal history of nicotine dependence: Secondary | ICD-10-CM | POA: Diagnosis not present

## 2018-12-24 DIAGNOSIS — R000 Tachycardia, unspecified: Secondary | ICD-10-CM | POA: Diagnosis not present

## 2018-12-24 DIAGNOSIS — Z79899 Other long term (current) drug therapy: Secondary | ICD-10-CM | POA: Insufficient documentation

## 2018-12-24 DIAGNOSIS — R0602 Shortness of breath: Secondary | ICD-10-CM | POA: Diagnosis not present

## 2018-12-24 DIAGNOSIS — J440 Chronic obstructive pulmonary disease with acute lower respiratory infection: Secondary | ICD-10-CM | POA: Diagnosis not present

## 2018-12-24 DIAGNOSIS — E119 Type 2 diabetes mellitus without complications: Secondary | ICD-10-CM | POA: Insufficient documentation

## 2018-12-24 DIAGNOSIS — J209 Acute bronchitis, unspecified: Secondary | ICD-10-CM | POA: Diagnosis not present

## 2018-12-24 DIAGNOSIS — J449 Chronic obstructive pulmonary disease, unspecified: Secondary | ICD-10-CM | POA: Diagnosis present

## 2018-12-24 DIAGNOSIS — R509 Fever, unspecified: Secondary | ICD-10-CM | POA: Diagnosis present

## 2018-12-24 DIAGNOSIS — J441 Chronic obstructive pulmonary disease with (acute) exacerbation: Secondary | ICD-10-CM | POA: Diagnosis not present

## 2018-12-24 DIAGNOSIS — Z791 Long term (current) use of non-steroidal anti-inflammatories (NSAID): Secondary | ICD-10-CM | POA: Diagnosis not present

## 2018-12-24 DIAGNOSIS — Z7984 Long term (current) use of oral hypoglycemic drugs: Secondary | ICD-10-CM | POA: Diagnosis not present

## 2018-12-24 DIAGNOSIS — R05 Cough: Secondary | ICD-10-CM | POA: Diagnosis present

## 2018-12-24 DIAGNOSIS — J4489 Other specified chronic obstructive pulmonary disease: Secondary | ICD-10-CM | POA: Diagnosis present

## 2018-12-24 LAB — HEMOGLOBIN A1C
Hgb A1c MFr Bld: 5.4 % (ref 4.8–5.6)
Mean Plasma Glucose: 108.28 mg/dL

## 2018-12-24 LAB — BASIC METABOLIC PANEL
Anion gap: 9 (ref 5–15)
BUN: 11 mg/dL (ref 6–20)
CO2: 26 mmol/L (ref 22–32)
Calcium: 9.1 mg/dL (ref 8.9–10.3)
Chloride: 104 mmol/L (ref 98–111)
Creatinine, Ser: 0.86 mg/dL (ref 0.44–1.00)
GFR calc Af Amer: >60 mL/min (ref >60)
GFR calc non Af Amer: >60 mL/min (ref >60)
GLUCOSE: 86 mg/dL (ref 70–99)
Potassium: 3.4 mmol/L — ABNORMAL LOW (ref 3.5–5.1)
Sodium: 139 mmol/L (ref 135–145)

## 2018-12-24 LAB — GLUCOSE, CAPILLARY: Glucose-Capillary: 218 mg/dL — ABNORMAL HIGH (ref 70–99)

## 2018-12-24 LAB — CBC WITH DIFFERENTIAL/PLATELET
Abs Immature Granulocytes: 0.02 10*3/uL (ref 0.00–0.07)
Basophils Absolute: 0 10*3/uL (ref 0.0–0.1)
Basophils Relative: 0 %
Eosinophils Absolute: 0.1 10*3/uL (ref 0.0–0.5)
Eosinophils Relative: 1 %
HCT: 45.1 % (ref 36.0–46.0)
Hemoglobin: 14.8 g/dL (ref 12.0–15.0)
Immature Granulocytes: 0 %
Lymphocytes Relative: 21 %
Lymphs Abs: 1.7 10*3/uL (ref 0.7–4.0)
MCH: 31.4 pg (ref 26.0–34.0)
MCHC: 32.8 g/dL (ref 30.0–36.0)
MCV: 95.6 fL (ref 80.0–100.0)
Monocytes Absolute: 0.6 10*3/uL (ref 0.1–1.0)
Monocytes Relative: 8 %
Neutro Abs: 5.7 10*3/uL (ref 1.7–7.7)
Neutrophils Relative %: 70 %
Platelets: 176 10*3/uL (ref 150–400)
RBC: 4.72 MIL/uL (ref 3.87–5.11)
RDW: 12.6 % (ref 11.5–15.5)
WBC: 8 10*3/uL (ref 4.0–10.5)
nRBC: 0 % (ref 0.0–0.2)

## 2018-12-24 MED ORDER — LORAZEPAM 2 MG/ML IJ SOLN
0.5000 mg | Freq: Once | INTRAMUSCULAR | Status: AC
  Administered 2018-12-24: 0.5 mg via INTRAVENOUS
  Filled 2018-12-24: qty 1

## 2018-12-24 MED ORDER — METFORMIN HCL 500 MG PO TABS
500.0000 mg | ORAL_TABLET | Freq: Every day | ORAL | Status: DC
  Administered 2018-12-25: 500 mg via ORAL
  Filled 2018-12-24: qty 1

## 2018-12-24 MED ORDER — BENZONATATE 100 MG PO CAPS
100.0000 mg | ORAL_CAPSULE | Freq: Three times a day (TID) | ORAL | Status: DC
  Administered 2018-12-24 – 2018-12-25 (×2): 100 mg via ORAL
  Filled 2018-12-24 (×2): qty 1

## 2018-12-24 MED ORDER — PANTOPRAZOLE SODIUM 40 MG PO TBEC
40.0000 mg | DELAYED_RELEASE_TABLET | Freq: Every day | ORAL | Status: DC
  Administered 2018-12-24: 40 mg via ORAL
  Filled 2018-12-24: qty 1

## 2018-12-24 MED ORDER — METHYLPREDNISOLONE SODIUM SUCC 125 MG IJ SOLR
60.0000 mg | Freq: Four times a day (QID) | INTRAMUSCULAR | Status: DC
  Administered 2018-12-24 – 2018-12-25 (×3): 60 mg via INTRAVENOUS
  Filled 2018-12-24 (×3): qty 2

## 2018-12-24 MED ORDER — IPRATROPIUM-ALBUTEROL 0.5-2.5 (3) MG/3ML IN SOLN
3.0000 mL | Freq: Four times a day (QID) | RESPIRATORY_TRACT | Status: DC
  Administered 2018-12-24: 3 mL via RESPIRATORY_TRACT
  Filled 2018-12-24: qty 3

## 2018-12-24 MED ORDER — IPRATROPIUM-ALBUTEROL 0.5-2.5 (3) MG/3ML IN SOLN
3.0000 mL | Freq: Three times a day (TID) | RESPIRATORY_TRACT | Status: DC
  Administered 2018-12-25: 3 mL via RESPIRATORY_TRACT
  Filled 2018-12-24: qty 3

## 2018-12-24 MED ORDER — ALBUTEROL (5 MG/ML) CONTINUOUS INHALATION SOLN
10.0000 mg/h | INHALATION_SOLUTION | Freq: Once | RESPIRATORY_TRACT | Status: AC
  Administered 2018-12-24: 10 mg/h via RESPIRATORY_TRACT

## 2018-12-24 MED ORDER — FLUTICASONE FUROATE-VILANTEROL 100-25 MCG/INH IN AEPB
1.0000 | INHALATION_SPRAY | Freq: Every day | RESPIRATORY_TRACT | Status: DC
  Filled 2018-12-24: qty 28

## 2018-12-24 MED ORDER — IPRATROPIUM BROMIDE 0.02 % IN SOLN: 0.5000 mg | Freq: Four times a day (QID) | RESPIRATORY_TRACT | Status: DC

## 2018-12-24 MED ORDER — ONDANSETRON HCL 4 MG PO TABS: 4.0000 mg | ORAL_TABLET | Freq: Four times a day (QID) | ORAL | Status: DC | PRN

## 2018-12-24 MED ORDER — METHYLPREDNISOLONE SODIUM SUCC 125 MG IJ SOLR
125.0000 mg | Freq: Once | INTRAMUSCULAR | Status: AC
  Administered 2018-12-24: 125 mg via INTRAVENOUS
  Filled 2018-12-24: qty 2

## 2018-12-24 MED ORDER — MAGNESIUM SULFATE 2 GM/50ML IV SOLN
2.0000 g | Freq: Once | INTRAVENOUS | Status: AC
  Administered 2018-12-24: 2 g via INTRAVENOUS
  Filled 2018-12-24: qty 50

## 2018-12-24 MED ORDER — ALBUTEROL SULFATE (2.5 MG/3ML) 0.083% IN NEBU: 2.5000 mg | INHALATION_SOLUTION | RESPIRATORY_TRACT | Status: DC | PRN

## 2018-12-24 MED ORDER — ENOXAPARIN SODIUM 40 MG/0.4ML ~~LOC~~ SOLN
40.0000 mg | SUBCUTANEOUS | Status: DC
  Administered 2018-12-24: 40 mg via SUBCUTANEOUS

## 2018-12-24 MED ORDER — ORAL CARE MOUTH RINSE: 15.0000 mL | Freq: Two times a day (BID) | OROMUCOSAL | Status: DC

## 2018-12-24 MED ORDER — SODIUM CHLORIDE 0.9 % IV SOLN
INTRAVENOUS | Status: DC
  Administered 2018-12-24 – 2018-12-25 (×3): via INTRAVENOUS

## 2018-12-24 MED ORDER — ALBUTEROL SULFATE (2.5 MG/3ML) 0.083% IN NEBU: 2.5000 mg | INHALATION_SOLUTION | Freq: Four times a day (QID) | RESPIRATORY_TRACT | Status: DC

## 2018-12-24 MED ORDER — ONDANSETRON HCL 4 MG/2ML IJ SOLN: 4.0000 mg | Freq: Four times a day (QID) | INTRAMUSCULAR | Status: DC | PRN

## 2018-12-24 MED ORDER — LORATADINE 10 MG PO TABS
10.0000 mg | ORAL_TABLET | Freq: Every day | ORAL | Status: DC
  Administered 2018-12-24: 10 mg via ORAL
  Filled 2018-12-24: qty 1

## 2018-12-24 MED ORDER — INSULIN ASPART 100 UNIT/ML ~~LOC~~ SOLN
0.0000 [IU] | Freq: Every day | SUBCUTANEOUS | Status: DC
  Administered 2018-12-24: 2 [IU] via SUBCUTANEOUS

## 2018-12-24 MED ORDER — INSULIN ASPART 100 UNIT/ML ~~LOC~~ SOLN
0.0000 [IU] | Freq: Three times a day (TID) | SUBCUTANEOUS | Status: DC
  Administered 2018-12-25: 1 [IU] via SUBCUTANEOUS

## 2018-12-24 NOTE — ED Notes (Signed)
Pt c/o her dry mouth on bi-pap   Jittery nervous  She does not like the bi-pap

## 2018-12-24 NOTE — ED Notes (Signed)
The pt pulled off her bi-pap reporting that her med was finished

## 2018-12-24 NOTE — ED Notes (Signed)
Report given to scott rn

## 2018-12-24 NOTE — ED Triage Notes (Signed)
Sob for one week   Hx copd  Sob on arrival  Just here on Friday for the same

## 2018-12-24 NOTE — ED Notes (Signed)
2nd attempt to call report unsuccessful.  

## 2018-12-24 NOTE — ED Notes (Signed)
Very anxious

## 2018-12-24 NOTE — ED Notes (Signed)
Report attempted however RN was unavailable.

## 2018-12-24 NOTE — ED Notes (Signed)
Staying off bi-pap  No ac ute resp distress pt c/o no tv available in this room

## 2018-12-24 NOTE — ED Notes (Signed)
pts sister called  Message left on her volic e mail

## 2018-12-24 NOTE — ED Provider Notes (Signed)
[facility] EMERGENCY DEPARTMENT Provider Note   CSN: [id number] Arrival date & time: 12/24/18  1520     History   Chief Complaint Chief Complaint  Patient presents with  . Shortness of Breath    HPI Madison Phillips is a 53 y.o. female.  53 year old female with history of asthma along with recent hospitalization for same presents with shortness of breath which became worse since Friday.  Was seen in the ED at that time and given medications and discharged home.  States that today she began to have wheezing and used a home nebulizer without relief.  Has had nonproductive cough without fever chills.  Denies any pleuritic chest pain.  No swelling in her legs.  No anginal symptoms.     Past Medical History:  Diagnosis Date  . COPD (chronic obstructive pulmonary disease) ([place])   . 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 Left ear hearing loss 12/05/2018    Patient Active Problem List   Diagnosis Date Noted  . Left ear hearing loss 12/05/2018  . 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 Varicose veins of left lower extremity 12/05/2018  . Acute respiratory failure with hypoxia ([place]) 12/05/2018  . COPD exacerbation ([place]) 12/05/2018    Past Surgical History:  Procedure Laterality Date  . cyber knife    . ectopic pregnanacy       OB History   No obstetric history on file.      Home Medications    Prior to Admission medications   Medication Sig Start Date End Date Taking? Authorizing Provider  albuterol (PROVENTIL HFA;VENTOLIN HFA) 108 (90 Base) MCG/ACT inhaler Inhale 1-2 puffs into the lungs every 6 (six) hours as needed for wheezing or shortness of breath. 12/07/18   Geradine Girt, DO  albuterol (PROVENTIL) (2.5 MG/3ML) 0.083% nebulizer solution Take 3 mLs (2.5 mg total) by nebulization every 2 (two) hours as needed for wheezing or shortness of breath. 12/07/18   Geradine Girt, DO  benzonatate (TESSALON) 100 MG capsule Take 1 capsule (100 mg total)  by mouth every 8 (eight) hours. 12/22/18   Domenic Moras, PA-C  fluticasone furoate-vilanterol (BREO ELLIPTA) 100-25 MCG/INH AEPB Inhale 1 puff into the lungs daily. 12/07/18   Geradine Girt, DO  gabapentin (NEURONTIN) 600 MG tablet Take 0.5 tablets (300 mg total) by mouth 3 (three) times daily. 12/07/18   Geradine Girt, DO  metFORMIN (GLUCOPHAGE) 500 MG tablet Take 1 tablet (500 mg total) by mouth daily with breakfast. 12/07/18   Geradine Girt, DO  pantoprazole (PROTONIX) 40 MG tablet Take 1 tablet (40 mg total) by mouth daily. 12/08/18   Geradine Girt, DO  predniSONE (DELTASONE) 10 MG tablet 40 mg x 2 days then 30 mg x 2 days then 20 mg x2 days then 10 mg x 2 days then stop 12/22/18   Domenic Moras, PA-C    Family History Family History  Problem Relation Age of Onset  . Hypothyroidism Mother   . Diabetes Mellitus II Maternal Aunt     Social History Social History   Tobacco Use  . Smoking status: Former Research scientist (life sciences)  . Smokeless tobacco: Never Used  Substance Use Topics  . Alcohol use: Yes    Alcohol/week: 2.0 - 3.0 standard drinks    Types: 2 - 3 Cans of beer per week    Comment: a night  . Drug use: Not Currently     Allergies   Patient has no known allergies.  Review of Systems Review of Systems  All other systems reviewed and are negative.    Physical Exam Updated Vital Signs BP (!) 183/125 (BP Location: Left Arm)   Pulse (!) 110   Temp 97.6 F (36.4 C) (Tympanic)   Resp 20   SpO2 100%   Physical Exam Vitals signs and nursing note reviewed.  Constitutional:      General: She is not in acute distress.    Appearance: Normal appearance. She is well-developed. She is not toxic-appearing.  HENT:     Head: Normocephalic and atraumatic.  Eyes:     General: Lids are normal.     Conjunctiva/sclera: Conjunctivae normal.     Pupils: Pupils are equal, round, and reactive to light.  Neck:     Musculoskeletal: Normal range of motion and neck supple.     Thyroid: No  thyroid mass.     Trachea: No tracheal deviation.  Cardiovascular:     Rate and Rhythm: Normal rate and regular rhythm.     Heart sounds: Normal heart sounds. No murmur. No gallop.   Pulmonary:     Effort: Pulmonary effort is normal. No respiratory distress.     Breath sounds: No stridor. Examination of the right-upper field reveals decreased breath sounds and wheezing. Examination of the left-upper field reveals decreased breath sounds and wheezing. Decreased breath sounds and wheezing present. No rhonchi or rales.  Abdominal:     General: Bowel sounds are normal. There is no distension.     Palpations: Abdomen is soft.     Tenderness: There is no abdominal tenderness. There is no rebound.  Musculoskeletal: Normal range of motion.        General: No tenderness.  Skin:    General: Skin is warm and dry.     Findings: No abrasion or rash.  Neurological:     Mental Status: She is alert and oriented to person, place, and time.     GCS: GCS eye subscore is 4. GCS verbal subscore is 5. GCS motor subscore is 6.     Cranial Nerves: No cranial nerve deficit.     Sensory: No sensory deficit.  Psychiatric:        Speech: Speech normal.        Behavior: Behavior normal.      ED Treatments / Results  Labs (all labs ordered are listed, but only abnormal results are displayed) Labs Reviewed  CBC WITH DIFFERENTIAL/PLATELET  BASIC METABOLIC PANEL    EKG EKG Interpretation  Date/Time:  Sunday December 24 2018 15:27:22 EST Ventricular Rate:  115 PR Interval:    QRS Duration: 80 QT Interval:  313 QTC Calculation: 433 R Axis:   81 Text Interpretation:  Sinus tachycardia Biatrial enlargement Probable anteroseptal infarct, old Abnormal T, consider ischemia, lateral leads Baseline wander in lead(s) I II aVR V3 V5 Confirmed by Lacretia Leigh ([id number]) on 12/24/2018 5:26:40 PM   Radiology No results found.  Procedures Procedures (including critical care time)  Medications Ordered in  ED Medications  methylPREDNISolone sodium succinate (SOLU-MEDROL) 125 mg/2 mL injection 125 mg (has no administration in time range)  magnesium sulfate IVPB 2 g 50 mL (has no administration in time range)  0.9 %  sodium chloride infusion (has no administration in time range)  LORazepam (ATIVAN) injection 0.5 mg (has no administration in time range)  albuterol (PROVENTIL,VENTOLIN) solution continuous neb (10 mg/hr Nebulization Given 12/24/18 1534)     Initial Impression / Assessment and Plan / ED Course  I  have reviewed the triage vital signs and the nursing notes.  Pertinent labs & imaging results that were available during my care of the patient were reviewed by me and considered in my medical decision making (see chart for details).     Patient given albuterol, Solu-Medrol, magnesium.  Was initially placed on BiPAP was able to be weaned off.  Will admit for observation.  CRITICAL CARE Performed by: Leota Jacobsen Total critical care time: 50 minutes Critical care time was exclusive of separately billable procedures and treating other patients. Critical care was necessary to treat or prevent imminent or life-threatening deterioration. Critical care was time spent personally by me on the following activities: development of treatment plan with patient and/or surrogate as well as nursing, discussions with consultants, evaluation of patient's response to treatment, examination of patient, obtaining history from patient or surrogate, ordering and performing treatments and interventions, ordering and review of laboratory studies, ordering and review of radiographic studies, pulse oximetry and re-evaluation of patient's condition.   Final Clinical Impressions(s) / ED Diagnoses   Final diagnoses:  None    ED Discharge Orders    None       Lacretia Leigh, MD 12/24/18 1726

## 2018-12-24 NOTE — ED Notes (Signed)
Pt was told by staff on 5c pt not appropriate for that floor  The pt is not keeping her 02 nasal in her nose it is presently on her chin  When asked why she did not have her 02 in her nose her reply was "I get tired of it"

## 2018-12-24 NOTE — ED Notes (Signed)
Almost instant relief on bi-pap

## 2018-12-24 NOTE — Progress Notes (Signed)
Pt admitted from ED. Pt is A&Ox4. Pt lives at home with sister. Pt's skin is warm, dry and intact. Pt placed on tele box #[id number]. Pt oriented to room. Told pt to call for assistance. Will continue to monitor pt. Ranelle Oyster, RN

## 2018-12-24 NOTE — Progress Notes (Signed)
Received report from ED.  

## 2018-12-24 NOTE — ED Notes (Signed)
The pt drove herself here  Came through triage son  She has been here x 3 in the past week   As soon as the pt was on bi-pap  She asked for her phone to text her sister that she was at the hospital.  She reported that she did not have to wait up front she just came straight back,  Asking why a tv was not in our trauma room

## 2018-12-24 NOTE — H&P (Signed)
[facility]                                                                                    Patient Demographics  Madison Phillips, is a 53 y.o. female  CSN: [id number]  MRN: [id number]  DOB - [birth year]  Admit Date - 12/24/2018  Outpatient Primary MD for the patient is Patient, No Pcp Per   With History of -  Past Medical History:  Diagnosis Date  . COPD (chronic obstructive pulmonary disease) ([place])   . 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 Left ear hearing loss 12/05/2018      Past Surgical History:  Procedure Laterality Date  . cyber knife    . ectopic pregnanacy      in for   Chief Complaint  Patient presents with  . Shortness of Breath     HPI  Margarine Grosshans  is a 53 y.o. female, with past medical history significant for COPD, history of acoustic neuroma presenting with 1 day history of increasing shortness of breath cough with whitish phlegm with reports fever and chills at home patient was in the emergency room around 2 weeks ago for the same complaints and was treated for COPD exacerbation. Patient reports that she moved into a house with a lot of mold present. SHEENT reports nausea and some pleuritic chest pain.   Review of Systems    In addition to the HPI above,   No Headache, No changes with Vision or hearing, No problems swallowing food or Liquids, No Abdominal pain, No Nausea or Vommitting, Bowel movements are regular, No Blood in stool or Urine, No dysuria, No new skin rashes or bruises, No new joints pains-aches,  No new weakness, tingling, numbness in any extremity, No recent weight gain or loss, No polyuria, polydypsia or polyphagia, No significant Mental Stressors.  A full 10 point Review of Systems was done, except as stated above, all other Review of Systems were negative.   Social History Social History   Tobacco Use  . Smoking status: Former Research scientist (life sciences)  . Smokeless tobacco: Never Used  Substance Use Topics  .  Alcohol use: Yes    Alcohol/week: 2.0 - 3.0 standard drinks    Types: 2 - 3 Cans of beer per week    Comment: a night     Family History Family History  Problem Relation Age of Onset  . Hypothyroidism Mother   . Diabetes Mellitus II Maternal Aunt      Prior to Admission medications   Medication Sig Start Date End Date Taking? Authorizing Provider  albuterol (PROVENTIL) (2.5 MG/3ML) 0.083% nebulizer solution Take 3 mLs (2.5 mg total) by nebulization every 2 (two) hours as needed for wheezing or shortness of breath. 12/07/18  Yes Vann, Jessica U, DO  ibuprofen (ADVIL,MOTRIN) 200 MG tablet Take 600-800 mg by mouth every 6 (six) hours as needed for mild pain.   Yes [provider]  albuterol (PROVENTIL HFA;VENTOLIN HFA) 108 (90 Base) MCG/ACT inhaler Inhale 1-2 puffs into the lungs every 6 (six) hours as needed for wheezing or shortness of breath. Patient not taking: Reported on 12/24/2018 12/07/18   Geradine Girt, DO  benzonatate (TESSALON) 100 MG capsule Take 1 capsule (100 mg total) by mouth every 8 (eight) hours. 12/22/18   Domenic Moras, PA-C  fluticasone furoate-vilanterol (BREO ELLIPTA) 100-25 MCG/INH AEPB Inhale 1 puff into the lungs daily. Patient not taking: Reported on 12/24/2018 12/07/18   Geradine Girt, DO  gabapentin (NEURONTIN) 600 MG tablet Take 0.5 tablets (300 mg total) by mouth 3 (three) times daily. Patient not taking: Reported on 12/24/2018 12/07/18   Geradine Girt, DO  metFORMIN (GLUCOPHAGE) 500 MG tablet Take 1 tablet (500 mg total) by mouth daily with breakfast. 12/07/18   Geradine Girt, DO  pantoprazole (PROTONIX) 40 MG tablet Take 1 tablet (40 mg total) by mouth daily. 12/08/18   Geradine Girt, DO  predniSONE (DELTASONE) 10 MG tablet 40 mg x 2 days then 30 mg x 2 days then 20 mg x2 days then 10 mg x 2 days then stop 12/22/18   Domenic Moras, PA-C    No Known Allergies  Physical Exam  Vitals  Blood pressure (!) 136/95, pulse (!) 140, temperature 97.6 F  (36.4 C), temperature source Tympanic, resp. rate 14, SpO2 94 %.   1. General well-developed, well-nourished in moderate shortness of breath  2. Normal affect and insight, Not Suicidal or Homicidal, Awake Alert, Oriented X 3.  3. No F.N deficits, ALL C.Nerves Intact, Strength 5/5 all 4 extremities, Sensation intact all 4 extremities, Plantars down going.  4. Ears and Eyes appear Normal, Conjunctivae clear, PERRLA. Moist Oral Mucosa.  5. Supple Neck, No JVD, No cervical lymphadenopathy appriciated, No Carotid Bruits.  6. Symmetrical Chest wall movement, decreased breath sounds bilaterally  7. RRR, tachycardic.  8. Positive Bowel Sounds, Abdomen Soft, Non tender,.  9.  No Cyanosis, Normal Skin Turgor, No Skin Rash or Bruise.  10. Good muscle tone,  joints appear normal , no effusions, Normal ROM.    Data Review  CBC Recent Labs  Lab 12/22/18 1733 12/24/18 1603  WBC 6.9 8.0  HGB 14.7 14.8  HCT 45.9 45.1  PLT 175 176  MCV 94.6 95.6  MCH 30.3 31.4  MCHC 32.0 32.8  RDW 12.5 12.6  LYMPHSABS 1.1 1.7  MONOABS 0.8 0.6  EOSABS 0.1 0.1  BASOSABS 0.0 0.0   ------------------------------------------------------------------------------------------------------------------  Chemistries  Recent Labs  Lab 12/22/18 1733 12/24/18 1603  NA 141 139  K 4.1 3.4*  CL 107 104  CO2 27 26  GLUCOSE 113* 86  BUN 10 11  CREATININE 0.99 0.86  CALCIUM 9.4 9.1   ------------------------------------------------------------------------------------------------------------------ CrCl cannot be calculated (Unknown ideal weight.). ------------------------------------------------------------------------------------------------------------------ No results for input(s): TSH, T4TOTAL, T3FREE, THYROIDAB in the last 72 hours.  Invalid input(s): FREET3   Coagulation profile No results for input(s): INR, PROTIME in the last 168  hours. ------------------------------------------------------------------------------------------------------------------- No results for input(s): DDIMER in the last 72 hours. -------------------------------------------------------------------------------------------------------------------  Cardiac Enzymes No results for input(s): CKMB, TROPONINI, MYOGLOBIN in the last 168 hours.  Invalid input(s): CK ------------------------------------------------------------------------------------------------------------------ Invalid input(s): POCBNP   ---------------------------------------------------------------------------------------------------------------  Urinalysis No results found for: COLORURINE, APPEARANCEUR, LABSPEC, [place], GLUCOSEU, HGBUR, BILIRUBINUR, KETONESUR, PROTEINUR, UROBILINOGEN, NITRITE, LEUKOCYTESUR  ----------------------------------------------------------------------------------------------------------------   Imaging results:   Dg Chest 2 View  Result Date: 12/22/2018 CLINICAL DATA:  Shortness of breath.  Cough. EXAM: CHEST - 2 VIEW COMPARISON:  12/05/2018. FINDINGS: Mediastinum and hilar structures normal. Mild bibasilar subsegmental atelectasis and or scarring. EKG leads are noted the patient. No pleural effusion or pneumothorax. No acute bony abnormality. IMPRESSION: Mild bibasilar subsegmental atelectasis and or scarring. Electronically Signed  By: Lucas   On: 12/22/2018 12:43   Ct Head Wo Contrast  Result Date: 12/06/2018 CLINICAL DATA:  Left-sided headache EXAM: CT HEAD WITHOUT CONTRAST TECHNIQUE: Contiguous axial images were obtained from the base of the skull through the vertex without intravenous contrast. COMPARISON:  None. FINDINGS: Brain: There is no mass, hemorrhage or extra-axial collection. The size and configuration of the ventricles and extra-axial CSF spaces are normal. The brain parenchyma is normal, without evidence of acute or chronic  infarction. Vascular: No abnormal hyperdensity of the major intracranial arteries or dural venous sinuses. No intracranial atherosclerosis. Skull: The visualized skull base, calvarium and extracranial soft tissues are normal. Sinuses/Orbits: No fluid levels or advanced mucosal thickening of the visualized paranasal sinuses. No mastoid or middle ear effusion. The orbits are normal. IMPRESSION: Normal head CT. Electronically Signed   By: Ulyses Jarred M.D.   On: 12/06/2018 00:17   Dg Chest Port 1 View  Result Date: 12/24/2018 CLINICAL DATA:  Pt complains of shortness of breath for one week. Reports some cough. Hx copd. Just here on Friday for the same EXAM: PORTABLE CHEST 1 VIEW COMPARISON:  12/22/2018 FINDINGS: Cardiac silhouette is normal in size and configuration. No mediastinal or hilar masses. No evidence of adenopathy. Lungs are hyperexpanded. There are increased markings as well as reticular scarring in the bases. There are decreased markings in the upper lobes consistent with emphysema. No evidence of pneumonia or pulmonary edema. No pleural effusion or pneumothorax. Skeletal structures are grossly intact. IMPRESSION: 1. No acute cardiopulmonary disease. 2. Emphysema. Electronically Signed   By: Lajean Manes M.D.   On: 12/24/2018 16:01   Dg Chest Portable 1 View  Result Date: 12/05/2018 CLINICAL DATA:  Shortness of breath EXAM: PORTABLE CHEST 1 VIEW COMPARISON:  None. FINDINGS: Cardiac shadow is within normal limits. The lungs are hyperinflated bilaterally. No focal infiltrate or sizable effusion are seen. No bony abnormality is noted. IMPRESSION: COPD without acute abnormality. Electronically Signed   By: Inez Catalina M.D.   On: 12/05/2018 19:31      Assessment & Plan  COPD exacerbation/bronchitis DuoNeb's Solu-Medrol Zithromax  Diabetes mellitus Continue with Glucophage Insulin sliding scale  Tachycardia Monitor on telemetry, probably secondary to bronchodilators  Probable  allergies Start with Claritin  DVT Prophylaxis Lovenox  AM Labs Ordered, also please review Full Orders    Code Status full  Disposition Plan: Home  Time spent in minutes : 32 minutes  Condition GUARDED   @SIGNATURE @

## 2018-12-25 ENCOUNTER — Ambulatory Visit (HOSPITAL_BASED_OUTPATIENT_CLINIC_OR_DEPARTMENT_OTHER): Payer: Medicaid Other | Admitting: Critical Care Medicine

## 2018-12-25 ENCOUNTER — Encounter: Payer: Self-pay | Admitting: Critical Care Medicine

## 2018-12-25 VITALS — BP 158/86 | HR 98 | Temp 98.6°F | Resp 18 | Ht 70.0 in | Wt 168.0 lb

## 2018-12-25 DIAGNOSIS — Z86018 Personal history of other benign neoplasm: Secondary | ICD-10-CM

## 2018-12-25 DIAGNOSIS — J9601 Acute respiratory failure with hypoxia: Secondary | ICD-10-CM

## 2018-12-25 DIAGNOSIS — K219 Gastro-esophageal reflux disease without esophagitis: Secondary | ICD-10-CM | POA: Insufficient documentation

## 2018-12-25 DIAGNOSIS — Z87891 Personal history of nicotine dependence: Secondary | ICD-10-CM

## 2018-12-25 DIAGNOSIS — D333 Benign neoplasm of cranial nerves: Secondary | ICD-10-CM | POA: Insufficient documentation

## 2018-12-25 DIAGNOSIS — Z833 Family history of diabetes mellitus: Secondary | ICD-10-CM | POA: Insufficient documentation

## 2018-12-25 DIAGNOSIS — J441 Chronic obstructive pulmonary disease with (acute) exacerbation: Secondary | ICD-10-CM | POA: Insufficient documentation

## 2018-12-25 DIAGNOSIS — I10 Essential (primary) hypertension: Secondary | ICD-10-CM | POA: Insufficient documentation

## 2018-12-25 DIAGNOSIS — Z79899 Other long term (current) drug therapy: Secondary | ICD-10-CM | POA: Insufficient documentation

## 2018-12-25 DIAGNOSIS — Z7952 Long term (current) use of systemic steroids: Secondary | ICD-10-CM | POA: Insufficient documentation

## 2018-12-25 HISTORY — DX: Gastro-esophageal reflux disease without esophagitis: K21.9

## 2018-12-25 LAB — GLUCOSE, CAPILLARY
Glucose-Capillary: 209 mg/dL — ABNORMAL HIGH (ref 70–99)
Glucose-Capillary: 126 mg/dL — ABNORMAL HIGH (ref 70–99)

## 2018-12-25 MED ORDER — DOXYCYCLINE HYCLATE 100 MG PO TABS: 100.0000 mg | ORAL_TABLET | Freq: Two times a day (BID) | ORAL | 0 refills | Status: DC

## 2018-12-25 MED ORDER — AMLODIPINE BESYLATE 10 MG PO TABS: 10.0000 mg | ORAL_TABLET | Freq: Every day | ORAL | 11 refills | Status: DC

## 2018-12-25 MED ORDER — BENZONATATE 100 MG PO CAPS: 100.0000 mg | ORAL_CAPSULE | Freq: Three times a day (TID) | ORAL | 0 refills | Status: DC | PRN

## 2018-12-25 MED ORDER — ACETAMINOPHEN 325 MG PO TABS
650.0000 mg | ORAL_TABLET | Freq: Four times a day (QID) | ORAL | Status: DC | PRN
  Administered 2018-12-25: 650 mg via ORAL
  Filled 2018-12-25: qty 2

## 2018-12-25 MED ORDER — PANTOPRAZOLE SODIUM 40 MG PO TBEC: 40.0000 mg | DELAYED_RELEASE_TABLET | Freq: Every day | ORAL | 6 refills | Status: DC

## 2018-12-25 MED ORDER — IPRATROPIUM-ALBUTEROL 0.5-2.5 (3) MG/3ML IN SOLN: RESPIRATORY_TRACT | 0 refills | Status: DC

## 2018-12-25 MED ORDER — POTASSIUM CHLORIDE CRYS ER 20 MEQ PO TBCR
40.0000 meq | EXTENDED_RELEASE_TABLET | Freq: Once | ORAL | Status: AC
  Administered 2018-12-25: 40 meq via ORAL
  Filled 2018-12-25: qty 2

## 2018-12-25 MED ORDER — PREDNISONE 5 MG PO TABS: ORAL_TABLET | ORAL | 0 refills | Status: DC

## 2018-12-25 MED ORDER — PREDNISONE 10 MG PO TABS: ORAL_TABLET | ORAL | 1 refills | Status: DC

## 2018-12-25 MED ORDER — LORATADINE 10 MG PO TABS: 10.0000 mg | ORAL_TABLET | Freq: Every day | ORAL | 0 refills | Status: DC

## 2018-12-25 MED ORDER — IPRATROPIUM-ALBUTEROL 0.5-2.5 (3) MG/3ML IN SOLN: RESPIRATORY_TRACT | 6 refills | Status: DC

## 2018-12-25 MED ORDER — SODIUM CHLORIDE 0.9 % IV SOLN
INTRAVENOUS | Status: DC
  Administered 2018-12-25: 08:00:00 via INTRAVENOUS

## 2018-12-25 MED FILL — predniSONE 10 MG TABS: 10 | 30 days supply | Qty: 48 | Fill #0

## 2018-12-25 MED FILL — DOXYCYCLINE HYCLATE 100 MG: 100 | 7 days supply | Qty: 14 | Fill #0

## 2018-12-25 MED FILL — BENZONATATE 100 MG CAP: 100 | 10 days supply | Qty: 30 | Fill #0

## 2018-12-25 MED FILL — AMLODIPINE BESYLATE 10 MG T: 10 | 30 days supply | Qty: 30 | Fill #0

## 2018-12-25 MED FILL — PANTOPRAZOLE SOD DR 40 MG T: 40 | 30 days supply | Qty: 30 | Fill #0

## 2018-12-25 MED FILL — IPRAT-ALBUT 0.5-3(2.5) MG/3: 0.5-2.5 (3) | 15 days supply | Qty: 180 | Fill #0

## 2018-12-25 NOTE — Progress Notes (Signed)
[name] to be D/C'd to home per MD order.  Discussed with the patient and all questions fully answered.  VSS, Skin clean, dry and intact without evidence of skin break down, no evidence of skin tears noted. IV catheter discontinued intact. Site without signs and symptoms of complications. Dressing and pressure applied.  An After Visit Summary was printed and given to the patient. Patient received prescriptions.  D/c education completed with patient/family including follow up instructions, medication list, d/c activities limitations if indicated, with other d/c instructions as indicated by MD - patient able to verbalize understanding, all questions fully answered.   Patient instructed to return to ED, call 911, or call MD for any changes in condition.   Patient escorted via [place], and D/C home via private auto.  Manuella Ghazi 12/25/2018 9:21 AM

## 2018-12-25 NOTE — Assessment & Plan Note (Signed)
Acute respiratory failure with hypoxemic component note saturations in the hospital have been in the low 80% range here in the office she initially was at 93% but upon getting up and walking just a few feet she rapidly dropped to the 88% range  I would recommend oxygen 2 L rest and exertion

## 2018-12-25 NOTE — Care Management Note (Signed)
Case Management Note  Patient Details  Name: Madison Phillips MRN: [id number] Date of Birth: 12/17/1966  Subjective/Objective:            Admitted with COPD exacerbation.From home with sister. Independent with ADL's, no DME usage. 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  PCP: [facility]   Action/Plan: Transition to home today. Pt states has f/u appointment @ [facility] with Dr. Asencion Noble. Pt has transportation to home.  Expected Discharge Date:  12/25/18               Expected Discharge Plan:  Home/Self Care  In-House Referral:  NA  Discharge planning Services  CM Consult, [facility]  Post Acute Care Choice:  NA Choice offered to:  NA  DME Arranged:  N/A DME Agency:  NA  HH Arranged:  NA HH Agency:  NA  Status of Service:  Completed, signed off  If discussed at [place] of Stay Meetings, dates discussed:    Additional Comments:  Sharin Mons, RN 12/25/2018, 9:11 AM

## 2018-12-25 NOTE — Assessment & Plan Note (Signed)
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  I do not feel comfortable managing this and have recommended the patient see neurology for further evaluation  Gabapentin does not appear to be of any benefit therefore this was discontinued

## 2018-12-25 NOTE — Patient Instructions (Addendum)
Begin nebulized medications 4 times daily with 2 additional treatments if needed  Begin Protonix once daily at breakfast, follow a reflux diet below  Begin prednisone pulsed dose as prescribed  Begin doxycycline twice daily for 7 days  Oxygen therapy will be prescribed 2 L at rest 2 L exertion  Begin amlodipine 1 daily for blood pressure control  Benzonatate is prescribed as needed for cough  financial counseling visit will be made   Lung function testing will be obtained  Referral  Food Choices for Gastroesophageal Reflux Disease, Adult When you have gastroesophageal reflux disease (GERD), the foods you eat and your eating habits are very important. Choosing the right foods can help ease the discomfort of GERD. Consider working with a diet and nutrition specialist (dietitian) to help you make healthy food choices. What general guidelines should I follow?  Eating plan  Choose healthy foods low in fat, such as fruits, vegetables, whole grains, low-fat dairy products, and lean meat, fish, and poultry.  Eat frequent, small meals instead of three large meals each day. Eat your meals slowly, in a relaxed setting. Avoid bending over or lying down until 2-3 hours after eating.  Limit high-fat foods such as fatty meats or fried foods.  Limit your intake of oils, butter, and shortening to less than 8 teaspoons each day.  Avoid the following: ? Foods that cause symptoms. These may be different for different people. Keep a food diary to keep track of foods that cause symptoms. ? Alcohol. ? Drinking large amounts of liquid with meals. ? Eating meals during the 2-3 hours before bed.  Cook foods using methods other than frying. This may include baking, grilling, or broiling. Lifestyle  Maintain a healthy weight. Ask your health care provider what weight is healthy for you. If you need to lose weight, work with your health care provider to do so safely.  Exercise for at least 30 minutes  on 5 or more days each week, or as told by your health care provider.  Avoid wearing clothes that fit tightly around your waist and chest.  Do not use any products that contain nicotine or tobacco, such as cigarettes and e-cigarettes. If you need help quitting, ask your health care provider.  Sleep with the head of your bed raised. Use a wedge under the mattress or blocks under the bed frame to raise the head of the bed. What foods are not recommended? The items listed may not be a complete list. Talk with your dietitian about what dietary choices are best for you. Grains Pastries or quick breads with added fat. [place] toast. Vegetables Deep fried vegetables. [place] fries. Any vegetables prepared with added fat. Any vegetables that cause symptoms. For some people this may include tomatoes and tomato products, chili peppers, onions and garlic, and horseradish. Fruits Any fruits prepared with added fat. Any fruits that cause symptoms. For some people this may include citrus fruits, such as oranges, grapefruit, pineapple, and lemons. Meats and other protein foods High-fat meats, such as fatty beef or pork, hot dogs, ribs, ham, sausage, salami and bacon. Fried meat or protein, including fried fish and fried chicken. Nuts and nut butters. Dairy Whole milk and chocolate milk. Sour cream. Cream. Ice cream. Cream cheese. Milk shakes. Beverages Coffee and tea, with or without caffeine. Carbonated beverages. Sodas. Energy drinks. Fruit juice made with acidic fruits (such as orange or grapefruit). Tomato juice. Alcoholic drinks. Fats and oils Butter. Margarine. Shortening. Ghee. Sweets and desserts Chocolate and cocoa.  Donuts. Seasoning and other foods Pepper. Peppermint and spearmint. Any condiments, herbs, or seasonings that cause symptoms. For some people, this may include curry, hot sauce, or vinegar-based salad dressings. Summary  When you have gastroesophageal reflux disease (GERD), food and  lifestyle choices are very important to help ease the discomfort of GERD.  Eat frequent, small meals instead of three large meals each day. Eat your meals slowly, in a relaxed setting. Avoid bending over or lying down until 2-3 hours after eating.  Limit high-fat foods such as fatty meat or fried foods. This information is not intended to replace advice given to you by your health care provider. Make sure you discuss any questions you have with your health care provider. Document Released: 12/06/2005 Document Revised: 12/07/2016 Document Reviewed: 12/07/2016 Elsevier Interactive Patient Education  2019 [employer].  to neurology will be made for the acoustic neuroma  Turn to Dr Joya Gaskins  in 1 week

## 2018-12-25 NOTE — Discharge Summary (Signed)
Madison Phillips IRJ:[id number] DOB: 1966/01/10 DOA: 12/24/2018  PCP: Patient, No Pcp Per  Admit date: 12/24/2018  Discharge date: 12/25/2018  Admitted From: Home   Disposition:  Home   Recommendations for Outpatient Follow-up:   Follow up with PCP in 1-2 weeks  PCP Please obtain BMP/CBC, 2 view CXR in 1week,  (see Discharge instructions)   PCP Please follow up on the following pending results:    Home Health: None   Equipment/Devices: None  Consultations: None Discharge Condition: Stable  CODE STATUS: Full   Diet Recommendation: Heart Healthy     Chief Complaint  Patient presents with  . Shortness of Breath     Brief history of present illness from the day of admission and additional interim summary     Madison Phillips  is a 53 y.o. female, with past medical history significant for COPD, history of acoustic neuroma presenting with 1 day history of increasing shortness of breath cough with whitish phlegm with reports fever and chills at home patient was in the emergency room around 2 weeks ago for the same complaints and was treated for COPD exacerbation. Patient reports that she moved into a house with a lot of mold present.                                                                   Hospital Course  COPD exacerbation/bronchitis Almost completely resolved, placed on PO steroid taper, has Pulm follow up today. Stable on RA, no wheezing.  Diabetes mellitus Continue with Glucophage and home Rx.     Probable allergies Start with Claritin      Discharge diagnosis     Active Problems:   COPD exacerbation ([place])    Discharge instructions    Discharge Instructions    Diet - low sodium heart healthy   Complete by:  As directed    Discharge instructions   Complete by:  As directed    Follow  with Primary MD in 7 days   Get CBC, CMP, 2 view Chest X ray -  checked  by Primary MD in 5-7 days   Activity: As tolerated with Full fall precautions use walker/cane & assistance as needed  Disposition Home   Diet: Heart Healthy Low Carb  Special Instructions: If you have smoked or chewed Tobacco  in the last 2 yrs please stop smoking, stop any regular Alcohol  and or any Recreational drug use.  On your next visit with your primary care physician please Get Medicines reviewed and adjusted.  Please request your Prim.MD to go over all Hospital Tests and Procedure/Radiological results at the follow up, please get all Hospital records sent to your Prim MD by signing hospital release before you go home.  If you experience worsening of your admission symptoms, develop shortness  of breath, life threatening emergency, suicidal or homicidal thoughts you must seek medical attention immediately by calling 911 or calling your MD immediately  if symptoms less severe.  You Must read complete instructions/literature along with all the possible adverse reactions/side effects for all the Medicines you take and that have been prescribed to you. Take any new Medicines after you have completely understood and accpet all the possible adverse reactions/side effects.                                                        Madison Phillips was admitted to the Hospital on 12/24/2018 and Discharged  12/25/2018 and should be excused from work/school   for 4  days starting from date -  12/22/2018 , may return to work/school without any restrictions.  Call Lala Lund MD, [facility]  [phone] with questions.  Lala Lund M.D on 12/25/2018,at 8:53 AM  [facility]   Office  [phone]   Increase activity slowly   Complete by:  As directed       Discharge Medications   Allergies as of 12/25/2018   No Known Allergies     Medication List    STOP taking these medications   predniSONE 10 MG  tablet Commonly known as:  DELTASONE Replaced by:  predniSONE 5 MG tablet     TAKE these medications   albuterol 108 (90 Base) MCG/ACT inhaler Commonly known as:  PROVENTIL HFA;VENTOLIN HFA Inhale 1-2 puffs into the lungs every 6 (six) hours as needed for wheezing or shortness of breath. What changed:  Another medication with the same name was removed. Continue taking this medication, and follow the directions you see here.   amLODipine 10 MG tablet Commonly known as:  NORVASC Take 1 tablet (10 mg total) by mouth daily.   benzonatate 100 MG capsule Commonly known as:  TESSALON Take 1 capsule (100 mg total) by mouth every 8 (eight) hours.   fluticasone furoate-vilanterol 100-25 MCG/INH Aepb Commonly known as:  BREO ELLIPTA Inhale 1 puff into the lungs daily.   gabapentin 600 MG tablet Commonly known as:  NEURONTIN Take 0.5 tablets (300 mg total) by mouth 3 (three) times daily.   ibuprofen 200 MG tablet Commonly known as:  ADVIL,MOTRIN Take 600-800 mg by mouth every 6 (six) hours as needed for mild pain.   ipratropium-albuterol 0.5-2.5 (3) MG/3ML Soln Commonly known as:  DUONEB Use twice a day scheduled and every 4 hours as needed for shortness of breath and wheezing   loratadine 10 MG tablet Commonly known as:  CLARITIN Take 1 tablet (10 mg total) by mouth daily.   metFORMIN 500 MG tablet Commonly known as:  GLUCOPHAGE Take 1 tablet (500 mg total) by mouth daily with breakfast.   pantoprazole 40 MG tablet Commonly known as:  PROTONIX Take 1 tablet (40 mg total) by mouth daily.   predniSONE 5 MG tablet Commonly known as:  DELTASONE Label  & dispense according to the schedule below. 8 Pills PO for 3 days, 6 Pills PO for 3 days, 4 Pills PO for 3 days, 2 Pills PO for 3 days, 1 Pills PO for 3 days, 1/2 Pill  PO for 3 days then STOP. Total 95 pills. Replaces:  predniSONE 10 MG tablet       Follow-up Information    [facility]. Schedule an  appointment as soon as possible for a visit in 1 week(s).   Contact information: [address] [place] [address] [phone]          Major procedures and Radiology Reports - PLEASE review detailed and final reports thoroughly  -         Dg Chest 2 View  Result Date: 12/22/2018 CLINICAL DATA:  Shortness of breath.  Cough. EXAM: CHEST - 2 VIEW COMPARISON:  12/05/2018. FINDINGS: Mediastinum and hilar structures normal. Mild bibasilar subsegmental atelectasis and or scarring. EKG leads are noted the patient. No pleural effusion or pneumothorax. No acute bony abnormality. IMPRESSION: Mild bibasilar subsegmental atelectasis and or scarring. Electronically Signed   By: Marcello Moores  Register   On: 12/22/2018 12:43   Ct Head Wo Contrast  Result Date: 12/06/2018 CLINICAL DATA:  Left-sided headache EXAM: CT HEAD WITHOUT CONTRAST TECHNIQUE: Contiguous axial images were obtained from the base of the skull through the vertex without intravenous contrast. COMPARISON:  None. FINDINGS: Brain: There is no mass, hemorrhage or extra-axial collection. The size and configuration of the ventricles and extra-axial CSF spaces are normal. The brain parenchyma is normal, without evidence of acute or chronic infarction. Vascular: No abnormal hyperdensity of the major intracranial arteries or dural venous sinuses. No intracranial atherosclerosis. Skull: The visualized skull base, calvarium and extracranial soft tissues are normal. Sinuses/Orbits: No fluid levels or advanced mucosal thickening of the visualized paranasal sinuses. No mastoid or middle ear effusion. The orbits are normal. IMPRESSION: Normal head CT. Electronically Signed   By: Ulyses Jarred M.D.   On: 12/06/2018 00:17   Dg Chest Port 1 View  Result Date: 12/24/2018 CLINICAL DATA:  Pt complains of shortness of breath for one week. Reports some cough. Hx copd. Just here on Friday for the same EXAM: PORTABLE CHEST 1 VIEW COMPARISON:   12/22/2018 FINDINGS: Cardiac silhouette is normal in size and configuration. No mediastinal or hilar masses. No evidence of adenopathy. Lungs are hyperexpanded. There are increased markings as well as reticular scarring in the bases. There are decreased markings in the upper lobes consistent with emphysema. No evidence of pneumonia or pulmonary edema. No pleural effusion or pneumothorax. Skeletal structures are grossly intact. IMPRESSION: 1. No acute cardiopulmonary disease. 2. Emphysema. Electronically Signed   By: Lajean Manes M.D.   On: 12/24/2018 16:01   Dg Chest Portable 1 View  Result Date: 12/05/2018 CLINICAL DATA:  Shortness of breath EXAM: PORTABLE CHEST 1 VIEW COMPARISON:  None. FINDINGS: Cardiac shadow is within normal limits. The lungs are hyperinflated bilaterally. No focal infiltrate or sizable effusion are seen. No bony abnormality is noted. IMPRESSION: COPD without acute abnormality. Electronically Signed   By: Inez Catalina M.D.   On: 12/05/2018 19:31    Micro Results     No results found for this or any previous visit (from the past 240 hour(s)).  Today   Subjective    Madison Phillips today has no headache,no chest abdominal pain,no new weakness tingling or numbness, feels much better wants to go home today.     Objective   Blood pressure 145/90, pulse 91, temperature 98 F (36.7 C), temperature source Oral, resp. rate 18, height 5\' 10"  (1.778 m), weight 70 kg, SpO2 91 %.   Intake/Output Summary (Last 24 hours) at 12/25/2018 0858 Last data filed at 12/25/2018 0603 Gross per 24 hour  Intake 1422.16 ml  Output -  Net 1422.16 ml    Exam  Awake Alert, Oriented x 3, No new F.N deficits, Normal affect [place].AT,PERRAL Supple Neck,No JVD, No cervical lymphadenopathy appriciated.  Symmetrical Chest wall movement, Good air movement bilaterally, CTAB RRR,No Gallops,Rubs or new Murmurs, No Parasternal Heave +ve B.Sounds, Abd Soft, Non tender, No organomegaly appriciated, No  rebound -guarding or rigidity. No Cyanosis, Clubbing or edema, No new Rash or bruise   Data Review   CBC w Diff:  Lab Results  Component Value Date   WBC 8.0 12/24/2018   HGB 14.8 12/24/2018   HCT 45.1 12/24/2018   PLT 176 12/24/2018   LYMPHOPCT 21 12/24/2018   MONOPCT 8 12/24/2018   EOSPCT 1 12/24/2018   BASOPCT 0 12/24/2018    CMP:  Lab Results  Component Value Date   NA 139 12/24/2018   K 3.4 (L) 12/24/2018   CL 104 12/24/2018   CO2 26 12/24/2018   BUN 11 12/24/2018   CREATININE 0.86 12/24/2018  .   Total Time in preparing paper work, data evaluation and todays exam - 1 minutes  Lala Lund M.D on 12/25/2018 at 8:58 AM  [facility]   Office  [phone]

## 2018-12-25 NOTE — Assessment & Plan Note (Addendum)
Hypertension that is poorly controlled  Plan Begin Norvasc 10 mg daily Note thyroid function has proven to be normal Note brain natruretic peptide and troponin levels were normal

## 2018-12-25 NOTE — Discharge Instructions (Signed)
Follow with Primary MD in 7 days   Get CBC, CMP, 2 view Chest X ray -  checked  by Primary MD in 5-7 days   Activity: As tolerated with Full fall precautions use walker/cane & assistance as needed  Disposition Home   Diet: Heart Healthy Low Carb  Special Instructions: If you have smoked or chewed Tobacco  in the last 2 yrs please stop smoking, stop any regular Alcohol  and or any Recreational drug use.  On your next visit with your primary care physician please Get Medicines reviewed and adjusted.  Please request your Prim.MD to go over all Hospital Tests and Procedure/Radiological results at the follow up, please get all Hospital records sent to your Prim MD by signing hospital release before you go home.  If you experience worsening of your admission symptoms, develop shortness of breath, life threatening emergency, suicidal or homicidal thoughts you must seek medical attention immediately by calling 911 or calling your MD immediately  if symptoms less severe.  You Must read complete instructions/literature along with all the possible adverse reactions/side effects for all the Medicines you take and that have been prescribed to you. Take any new Medicines after you have completely understood and accpet all the possible adverse reactions/side effects.                                                        Madison Phillips was admitted to the Hospital on 12/24/2018 and Discharged  12/25/2018 and should be excused from work/school   for 4  days starting from date -  12/22/2018 , may return to work/school without any restrictions.  Call Lala Lund MD, [facility]  [phone] with questions.  Lala Lund M.D on 12/25/2018,at 8:53 AM  [facility]   Office  [phone]

## 2018-12-25 NOTE — Assessment & Plan Note (Signed)
Gastroesophageal reflux disease likely contributing to patient's respiratory status  A reflux diet was reviewed with the patient We will begin Protonix 40 mg daily at meals breakfast time

## 2018-12-25 NOTE — Assessment & Plan Note (Signed)
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 and significant airway disease steroid dependent and also likely precipitated by reflux  Plan Begin albuterol and ipratropium and nebulizer 4 times daily and 2 additional treatments as needed  Re-pulse prednisone 40 mg daily for 5 days then reduce to 10 mg a day and hold  Administer a 7-day course of doxycycline 100 mg twice daily for acute bronchitic component  Begin oxygen 2 L rest and exertion based on desaturation into the low 80% range  The patient was instructed as to the proper use of pursed lip breathing at this visit  We will hold off on HFA and dry powdered inhalers for now until the patient's lung function improves  Obtain pulmonary function study Obtain echocardiogram

## 2018-12-25 NOTE — Progress Notes (Signed)
Subjective:    Patient ID: Madison Phillips, female    DOB: Jan 30, 1966, 53 y.o.   MRN: [id number]  52 y.o.F with COPD just d/c from hospital this AM   past medical history significant for COPD, history of acoustic neuroma presenting  Pt is now non smoker since 2014  The patient just moved here from [place] a year ago.  She has no insurance.  She is had progressive dyspnea for the past year.  She has been in the emergency room and also hospitalized on several occasions since early December 2019.  She was also seen in our clinic December 05, 2018 and found to be severely hypoxic and thus sent to the emergency room for further evaluation.  She was subsequently admitted for several days and treated for COPD exacerbation discharged home only to return on 22 December 2017 for yet another COPD exacerbation she was given Ladona Ridgel and prednisone for which she did not fill and immediately return to the emergency room over the weekend on 12/24/2017.  She was admitted overnight and discharged early this morning.  She essentially is on no medications at home other than as needed gabapentin for her acoustic neuroma.  She has been on Brio inhaler along with Symbicort in the past neither which is really helping.  She previously did like Advair when she was in [place].  She is not smoke since 2014.  She states oral prednisone does help.  She has very significant anxiety component. If get up is severly dyspneic, any arm activity is sob   Distance work to door out of breath  The patient is not yet on any oxygen at home.  The home the patient has been and has had mold issues.  She has not had previous diagnosis of asthma or hyper allergic function.   Shortness of Breath  This is a chronic problem. The current episode started more than 1 month ago. The problem occurs constantly. The problem has been rapidly worsening. Associated symptoms include chest pain, headaches, PND, a sore throat, sputum production and  wheezing. Pertinent negatives include no ear pain, fever, hemoptysis, leg swelling, orthopnea, rash, rhinorrhea, swollen glands, syncope or vomiting. The symptoms are aggravated by any activity, animal exposure, lying flat, emotional upset, exercise, fumes, odors, pollens, occupational exposure, URIs, smoke and weather changes (mold in home, just cleaned out , works for [employer].  local office ). Associated symptoms comments: Severe dyspnea with talking  Chest is tight Mucus is yellow and white abd is bloating . Risk factors include smoking. She has tried beta agonist inhalers and oral steroids for the symptoms. The treatment provided moderate relief. Her past medical history is significant for COPD and pneumonia. There is no history of allergies, aspirin allergies, asthma, CAD, a heart failure or PE.    Past Medical History:  Diagnosis Date  . COPD (chronic obstructive pulmonary disease) ([place])   . GERD (gastroesophageal reflux disease) 12/25/2018  . 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 Left ear hearing loss 12/05/2018     Family History  Problem Relation Age of Onset  . Hypothyroidism Mother   . Diabetes Mellitus II Maternal Aunt   . Diabetes Maternal Aunt   . Cancer Father      Social History   Socioeconomic History  . Marital status: Single    Spouse name: Not on file  . Number of children: Not on file  . Years of education: Not on file  . Highest education level: Not on  file  Occupational History  . Not on file  Social Needs  . Financial resource strain: Not on file  . Food insecurity:    Worry: Not on file    Inability: Not on file  . Transportation needs:    Medical: Not on file    Non-medical: Not on file  Tobacco Use  . Smoking status: Former Research scientist (life sciences)  . Smokeless tobacco: Never Used  Substance and Sexual Activity  . Alcohol use: Yes    Alcohol/week: 2.0 - 3.0 standard drinks    Types: 2 - 3 Cans of beer per week    Comment: a night  . Drug use: Not Currently  .  Sexual activity: Yes  Lifestyle  . Physical activity:    Days per week: Not on file    Minutes per session: Not on file  . Stress: Not on file  Relationships  . Social connections:    Talks on phone: Not on file    Gets together: Not on file    Attends religious service: Not on file    Active member of club or organization: Not on file    Attends meetings of clubs or organizations: Not on file    Relationship status: Not on file  . Intimate partner violence:    Fear of current or ex partner: Not on file    Emotionally abused: Not on file    Physically abused: Not on file    Forced sexual activity: Not on file  Other Topics Concern  . Not on file  Social History Narrative  . Not on file     No Known Allergies   Outpatient Medications Prior to Visit  Medication Sig Dispense Refill  . albuterol (PROVENTIL HFA;VENTOLIN HFA) 108 (90 Base) MCG/ACT inhaler Inhale 1-2 puffs into the lungs every 6 (six) hours as needed for wheezing or shortness of breath. 1 Inhaler 0  . predniSONE (DELTASONE) 5 MG tablet Label  & dispense according to the schedule below. 8 Pills PO for 3 days, 6 Pills PO for 3 days, 4 Pills PO for 3 days, 2 Pills PO for 3 days, 1 Pills PO for 3 days, 1/2 Pill  PO for 3 days then STOP. Total 95 pills. 65 tablet 0  . amLODipine (NORVASC) 10 MG tablet Take 1 tablet (10 mg total) by mouth daily. (Patient not taking: Reported on 12/25/2018) 30 tablet 11  . benzonatate (TESSALON) 100 MG capsule Take 1 capsule (100 mg total) by mouth every 8 (eight) hours. (Patient not taking: Reported on 12/25/2018) 21 capsule 0  . fluticasone furoate-vilanterol (BREO ELLIPTA) 100-25 MCG/INH AEPB Inhale 1 puff into the lungs daily. (Patient not taking: Reported on 12/25/2018) 28 each 0  . gabapentin (NEURONTIN) 600 MG tablet Take 0.5 tablets (300 mg total) by mouth 3 (three) times daily. (Patient not taking: Reported on 12/25/2018) 90 tablet 0  . ibuprofen (ADVIL,MOTRIN) 200 MG tablet Take 600-800 mg by  mouth every 6 (six) hours as needed for mild pain.    Marland Kitchen ipratropium-albuterol (DUONEB) 0.5-2.5 (3) MG/3ML SOLN Use twice a day scheduled and every 4 hours as needed for shortness of breath and wheezing (Patient not taking: Reported on 12/25/2018) 360 mL 0  . loratadine (CLARITIN) 10 MG tablet Take 1 tablet (10 mg total) by mouth daily. (Patient not taking: Reported on 12/25/2018) 30 tablet 0  . metFORMIN (GLUCOPHAGE) 500 MG tablet Take 1 tablet (500 mg total) by mouth daily with breakfast. (Patient not taking: Reported on 12/25/2018) 15  tablet 0  . pantoprazole (PROTONIX) 40 MG tablet Take 1 tablet (40 mg total) by mouth daily. (Patient not taking: Reported on 12/25/2018) 14 tablet 0   No facility-administered medications prior to visit.      Review of Systems  Constitutional: Positive for fatigue. Negative for fever.  HENT: Positive for sneezing, sore throat and trouble swallowing. Negative for ear pain, mouth sores, nosebleeds, postnasal drip, rhinorrhea, sinus pressure and sinus pain.   Eyes: Negative for pain, discharge, redness and itching.  Respiratory: Positive for cough, sputum production, chest tightness, shortness of breath and wheezing. Negative for hemoptysis.        Cough is dry, mucus is yellow  Cardiovascular: Positive for chest pain, palpitations and PND. Negative for orthopnea, leg swelling and syncope.  Gastrointestinal: Positive for diarrhea. Negative for blood in stool and vomiting.       Burp and gassy  Genitourinary:       Incontinence with cough  Skin: Negative for rash.  Neurological: Positive for headaches.  Hematological: Bruises/bleeds easily.  Psychiatric/Behavioral: Positive for sleep disturbance. Negative for self-injury and suicidal ideas. The patient is nervous/anxious.        Objective:   Physical Exam Vitals:   12/25/18 1033  BP: (!) 158/86  Pulse: 98  Resp: 18  Temp: 98.6 F (37 C)  TempSrc: Oral  SpO2: 93%  Weight: 168 lb (76.2 kg)  Height: 5\' 10"   (1.778 m)    Gen: Pleasant, well-nourished, in no distress,  Anxious affect  ENT: No lesions,  mouth clear,  oropharynx clear, no postnasal drip  Neck: No JVD, no TMG, no carotid bruits  Lungs: No use of accessory muscles, no dullness to percussion, distant BS, poor airflow, exp wheezes  Cardiovascular: RRR, heart sounds normal, no murmur or gallops, no peripheral edema  Abdomen: soft and NT, no HSM,  BS normal  Musculoskeletal: No deformities, no cyanosis or clubbing  Neuro: alert, non focal  Skin: Warm, no lesions or rashes  Dg Chest Port 1 View  Result Date: 12/24/2018 CLINICAL DATA:  Pt complains of shortness of breath for one week. Reports some cough. Hx copd. Just here on Friday for the same EXAM: PORTABLE CHEST 1 VIEW COMPARISON:  12/22/2018 FINDINGS: Cardiac silhouette is normal in size and configuration. No mediastinal or hilar masses. No evidence of adenopathy. Lungs are hyperexpanded. There are increased markings as well as reticular scarring in the bases. There are decreased markings in the upper lobes consistent with emphysema. No evidence of pneumonia or pulmonary edema. No pleural effusion or pneumothorax. Skeletal structures are grossly intact. IMPRESSION: 1. No acute cardiopulmonary disease. 2. Emphysema. Electronically Signed   By: Lajean Manes M.D.   On: 12/24/2018 16:01          BMP Latest Ref Rng & Units 12/24/2018 12/22/2018 12/07/2018  Glucose 70 - 99 mg/dL 86 113(H) 102(H)  BUN 6 - 20 mg/dL 11 10 15   Creatinine 0.44 - 1.00 mg/dL 0.86 0.99 1.01(H)  Sodium 135 - 145 mmol/L 139 141 142  Potassium 3.5 - 5.1 mmol/L 3.4(L) 4.1 4.2  Chloride 98 - 111 mmol/L 104 107 105  CO2 22 - 32 mmol/L 26 27 25   Calcium 8.9 - 10.3 mg/dL 9.1 9.4 9.7   Lab Results  Component Value Date   WBC 8.0 12/24/2018   HGB 14.8 12/24/2018   HCT 45.1 12/24/2018   MCV 95.6 12/24/2018   PLT 176 12/24/2018   EKG: LVH pattern  No Echo obtained  Assessment & Plan:  I personally  reviewed all images and lab data in the [facility] system as well as any outside material available during this office visit and agree with the  radiology impressions.   COPD exacerbation (HCC) 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 and significant airway disease steroid dependent and also likely precipitated by reflux  Plan Begin albuterol and ipratropium and nebulizer 4 times daily and 2 additional treatments as needed  Re-pulse prednisone 40 mg daily for 5 days then reduce to 10 mg a day and hold  Administer a 7-day course of doxycycline 100 mg twice daily for acute bronchitic component  Begin oxygen 2 L rest and exertion based on desaturation into the low 80% range  The patient was instructed as to the proper use of pursed lip breathing at this visit  We will hold off on HFA and dry powdered inhalers for now until the patient's lung function improves  Obtain pulmonary function study Obtain echocardiogram  Acute respiratory failure with hypoxia ([place]) Acute respiratory failure with hypoxemic component note saturations in the hospital have been in the low 80% range here in the office she initially was at 93% but upon getting up and walking just a few feet she rapidly dropped to the 88% range  I would recommend oxygen 2 L rest and exertion  History of acoustic neuroma 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  I do not feel comfortable managing this and have recommended the patient see neurology for further evaluation  Gabapentin does not appear to be of any benefit therefore this was discontinued  Hypertension Hypertension that is poorly controlled  Plan Begin Norvasc 10 mg daily Note thyroid function has proven to be normal Note brain natruretic peptide and troponin levels were normal   GERD (gastroesophageal reflux disease) Gastroesophageal reflux disease  likely contributing to patient's respiratory status  A reflux diet was reviewed with the patient We will begin Protonix 40 mg daily at meals breakfast time   [place] was seen today for hospitalization follow-up.  Diagnoses and all orders for this visit:  COPD exacerbation ([place]) -     Pulmonary Function Test; Future -     For home use only DME oxygen -     ECHOCARDIOGRAM COMPLETE; Future  History of acoustic neuroma -     Ambulatory referral to Neurology  Acute respiratory failure with hypoxia ([place]) -     For home use only DME oxygen -     ECHOCARDIOGRAM COMPLETE; Future  Essential hypertension  Gastroesophageal reflux disease without esophagitis  Other orders -     pantoprazole (PROTONIX) 40 MG tablet; Take 1 tablet (40 mg total) by mouth daily before breakfast. -     ipratropium-albuterol (DUONEB) 0.5-2.5 (3) MG/3ML SOLN; Use 4 times daily on schedule, may take two additional treatments as needed -     Discontinue: benzonatate (TESSALON) 100 MG capsule; Take 1 capsule (100 mg total) by mouth 3 (three) times daily as needed for cough. -     amLODipine (NORVASC) 10 MG tablet; Take 1 tablet (10 mg total) by mouth daily. -     predniSONE (DELTASONE) 10 MG tablet; Take 4 tablets daily for 5 days then one daily and stay -     doxycycline (VIBRA-TABS) 100 MG tablet; Take 1 tablet (100 mg total) by mouth 2 (two) times daily. -     benzonatate (TESSALON) 100  MG capsule; Take 1 capsule (100 mg total) by mouth 3 (three) times daily as needed for cough.

## 2019-01-01 ENCOUNTER — Ambulatory Visit: Payer: Self-pay | Attending: Family Medicine

## 2019-01-01 ENCOUNTER — Ambulatory Visit: Payer: Self-pay | Admitting: Family Medicine

## 2019-01-02 ENCOUNTER — Encounter: Payer: Self-pay | Admitting: Critical Care Medicine

## 2019-01-02 ENCOUNTER — Ambulatory Visit: Payer: Medicaid Other | Attending: Critical Care Medicine | Admitting: Critical Care Medicine

## 2019-01-02 VITALS — BP 120/88 | HR 97 | Temp 97.9°F | Resp 20 | Ht 70.0 in | Wt 176.0 lb

## 2019-01-02 DIAGNOSIS — F419 Anxiety disorder, unspecified: Secondary | ICD-10-CM | POA: Insufficient documentation

## 2019-01-02 DIAGNOSIS — Z7952 Long term (current) use of systemic steroids: Secondary | ICD-10-CM | POA: Diagnosis not present

## 2019-01-02 DIAGNOSIS — Z7951 Long term (current) use of inhaled steroids: Secondary | ICD-10-CM | POA: Insufficient documentation

## 2019-01-02 DIAGNOSIS — Z79899 Other long term (current) drug therapy: Secondary | ICD-10-CM | POA: Insufficient documentation

## 2019-01-02 DIAGNOSIS — K219 Gastro-esophageal reflux disease without esophagitis: Secondary | ICD-10-CM | POA: Diagnosis not present

## 2019-01-02 DIAGNOSIS — J441 Chronic obstructive pulmonary disease with (acute) exacerbation: Secondary | ICD-10-CM | POA: Diagnosis not present

## 2019-01-02 DIAGNOSIS — J9601 Acute respiratory failure with hypoxia: Secondary | ICD-10-CM

## 2019-01-02 DIAGNOSIS — Z833 Family history of diabetes mellitus: Secondary | ICD-10-CM | POA: Diagnosis not present

## 2019-01-02 DIAGNOSIS — Z87891 Personal history of nicotine dependence: Secondary | ICD-10-CM | POA: Insufficient documentation

## 2019-01-02 MED ORDER — GUAIFENESIN ER 600 MG PO TB12: 1200.0000 mg | ORAL_TABLET | Freq: Two times a day (BID) | ORAL | 6 refills | Status: DC

## 2019-01-02 MED ORDER — MOMETASONE FURO-FORMOTEROL FUM 200-5 MCG/ACT IN AERO: 2.0000 | INHALATION_SPRAY | Freq: Two times a day (BID) | RESPIRATORY_TRACT | 4 refills | Status: DC

## 2019-01-02 MED ORDER — IPRATROPIUM-ALBUTEROL 0.5-2.5 (3) MG/3ML IN SOLN
3.0000 mL | Freq: Once | RESPIRATORY_TRACT | Status: AC
  Administered 2019-01-02: 3 mL via RESPIRATORY_TRACT

## 2019-01-02 MED ORDER — METHYLPREDNISOLONE SODIUM SUCC 125 MG IJ SOLR
125.0000 mg | Freq: Once | INTRAMUSCULAR | Status: AC
  Administered 2019-01-02: 125 mg via INTRAMUSCULAR

## 2019-01-02 NOTE — Assessment & Plan Note (Signed)
Ongoing COPD exacerbation with mucous plugging and failure to respond to [facility] inhaler secondary to inability to operate the device properly  Side effects from corticosteroid use orally Hypoxic respiratory failure and has not yet acquired the oxygen therapy  Plan Discontinue prednisone Administer Medrol 125 mg injection today Discontinue Breo inhaler Begin Dulera 200 mcg at 2 puffs twice daily Obtain Mucinex 1200 mg twice daily Obtain oxygen therapy as prescribed The patient was given a form for handicap placard and also a family leave form will be completed  An echocardiogram and pulmonary function studies yet are to be completed

## 2019-01-02 NOTE — Progress Notes (Signed)
Subjective:    Patient ID: Madison Phillips, female    DOB: 1966/11/15, 53 y.o.   MRN: [id number]  52 y.o.F with COPD    past medical history significant for COPD, history of acoustic neuroma presenting  Pt is now non smoker since 2014  The patient just moved here from [place] a year ago.  She has no insurance.  She is had progressive dyspnea for the past year.  She has been in the emergency room and also hospitalized on several occasions since early December 2019.  She was also seen in our clinic December 05, 2018 and found to be severely hypoxic and thus sent to the emergency room for further evaluation.  She was subsequently admitted for several days and treated for COPD exacerbation discharged home only to return on 22 December 2017 for yet another COPD exacerbation she was given Ladona Ridgel and prednisone for which she did not fill and immediately return to the emergency room over the weekend on 12/24/2017.  She was admitted overnight and discharged early this morning.  She essentially is on no medications at home other than as needed gabapentin for her acoustic neuroma.  She has been on Brio inhaler along with Symbicort in the past neither which is really helping.  She previously did like Advair when she was in [place].  She is not smoke since 2014.  She states oral prednisone does help.  She has very significant anxiety component. If get up is severly dyspneic, any arm activity is sob   Distance work to door out of breath  The patient is not yet on any oxygen at home.  The home the patient has been and has had mold issues.  She has not had previous diagnosis of asthma or hyper allergic function.   01/02/2019 Since last visit: The patient feels the Memory Dance has not been useful and there is not been improvement with this.  She has been on Symbicort in the past without much improvement.  The patient has significant cough with retained secretions.  She has not yet received the oxygen therapy as  she is waiting on insurance approval.  She is on a medical leave at this time.    She brings a physician's note from 2014 which showed she had a room air saturation of 97% but moderate obstruction on spirometry.  The exact numbers from the spirometry are not available for my review.  She complains that the prednisone is causing weight gain and agitation.  Shortness of Breath  This is a chronic problem. The current episode started more than 1 month ago. The problem occurs constantly. The problem has been rapidly worsening. Associated symptoms include chest pain, headaches, PND, a sore throat, sputum production and wheezing. Pertinent negatives include no ear pain, fever, hemoptysis, leg swelling, orthopnea, rash, rhinorrhea, swollen glands, syncope or vomiting. The symptoms are aggravated by any activity, animal exposure, lying flat, emotional upset, exercise, fumes, odors, pollens, occupational exposure, URIs, smoke and weather changes (mold in home, just cleaned out , works for [employer].  local office ). Associated symptoms comments: Severe dyspnea with talking  Chest is tight Mucus is yellow and white abd is bloating . Risk factors include smoking. She has tried beta agonist inhalers and oral steroids for the symptoms. The treatment provided moderate relief. Her past medical history is significant for COPD and pneumonia. There is no history of allergies, aspirin allergies, asthma, CAD, a heart failure or PE.    Past Medical History:  Diagnosis Date  . COPD (chronic obstructive pulmonary disease) ([place])   . GERD (gastroesophageal reflux disease) 12/25/2018  . 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 Left ear hearing loss 12/05/2018     Family History  Problem Relation Age of Onset  . Hypothyroidism Mother   . Diabetes Mellitus II Maternal Aunt   . Diabetes Maternal Aunt   . Cancer Father      Social History   Socioeconomic History  . Marital status: Single    Spouse name: Not on file  .  Number of children: Not on file  . Years of education: Not on file  . Highest education level: Not on file  Occupational History  . Not on file  Social Needs  . Financial resource strain: Not on file  . Food insecurity:    Worry: Not on file    Inability: Not on file  . Transportation needs:    Medical: Not on file    Non-medical: Not on file  Tobacco Use  . Smoking status: Former Research scientist (life sciences)  . Smokeless tobacco: Never Used  Substance and Sexual Activity  . Alcohol use: Yes    Alcohol/week: 2.0 - 3.0 standard drinks    Types: 2 - 3 Cans of beer per week    Comment: a night  . Drug use: Not Currently  . Sexual activity: Yes  Lifestyle  . Physical activity:    Days per week: Not on file    Minutes per session: Not on file  . Stress: Not on file  Relationships  . Social connections:    Talks on phone: Not on file    Gets together: Not on file    Attends religious service: Not on file    Active member of club or organization: Not on file    Attends meetings of clubs or organizations: Not on file    Relationship status: Not on file  . Intimate partner violence:    Fear of current or ex partner: Not on file    Emotionally abused: Not on file    Physically abused: Not on file    Forced sexual activity: Not on file  Other Topics Concern  . Not on file  Social History Narrative  . Not on file     No Known Allergies   Outpatient Medications Prior to Visit  Medication Sig Dispense Refill  . albuterol (PROVENTIL HFA;VENTOLIN HFA) 108 (90 Base) MCG/ACT inhaler Inhale 1-2 puffs into the lungs every 6 (six) hours as needed for wheezing or shortness of breath. 1 Inhaler 0  . amLODipine (NORVASC) 10 MG tablet Take 1 tablet (10 mg total) by mouth daily. 30 tablet 11  . benzonatate (TESSALON) 100 MG capsule Take 1 capsule (100 mg total) by mouth 3 (three) times daily as needed for cough. 30 capsule 0  . doxycycline (VIBRA-TABS) 100 MG tablet Take 1 tablet (100 mg total) by mouth 2  (two) times daily. 14 tablet 0  . ipratropium-albuterol (DUONEB) 0.5-2.5 (3) MG/3ML SOLN Use 4 times daily on schedule, may take two additional treatments as needed 360 mL 6  . pantoprazole (PROTONIX) 40 MG tablet Take 1 tablet (40 mg total) by mouth daily before breakfast. 30 tablet 6  . predniSONE (DELTASONE) 10 MG tablet Take 4 tablets daily for 5 days then one daily and stay 60 tablet 1   No facility-administered medications prior to visit.      Review of Systems  Constitutional: Positive for fatigue. Negative for fever.  HENT:  Positive for sneezing, sore throat and trouble swallowing. Negative for ear pain, mouth sores, nosebleeds, postnasal drip, rhinorrhea, sinus pressure and sinus pain.   Eyes: Negative for pain, discharge, redness and itching.  Respiratory: Positive for cough, sputum production, chest tightness, shortness of breath and wheezing. Negative for hemoptysis.        Cough is dry, mucus is yellow  Cardiovascular: Positive for chest pain, palpitations and PND. Negative for orthopnea, leg swelling and syncope.  Gastrointestinal: Positive for diarrhea. Negative for blood in stool and vomiting.       Burp and gassy  Genitourinary:       Incontinence with cough  Skin: Negative for rash.  Neurological: Positive for headaches.  Hematological: Bruises/bleeds easily.  Psychiatric/Behavioral: Positive for sleep disturbance. Negative for self-injury and suicidal ideas. The patient is nervous/anxious.        Objective:   Physical Exam Vitals:   01/02/19 1531  BP: 120/88  Pulse: 97  Resp: 20  Temp: 97.9 F (36.6 C)  TempSrc: Oral  SpO2: 93%  Weight: 176 lb (79.8 kg)  Height: 5\' 10"  (1.778 m)    Gen: Pleasant, well-nourished, in no distress,  Anxious affect  ENT: No lesions,  mouth clear,  oropharynx clear, no postnasal drip  Neck: No JVD, no TMG, no carotid bruits  Lungs: No use of accessory muscles, no dullness to percussion, distant BS, poor airflow, exp  wheezes, scattered rhonchi  Cardiovascular: RRR, heart sounds normal, no murmur or gallops, no peripheral edema  Abdomen: soft and NT, no HSM,  BS normal  Musculoskeletal: No deformities, no cyanosis or clubbing  Neuro: alert, non focal  Skin: Warm, no lesions or rashes  No results found.        BMP Latest Ref Rng & Units 12/24/2018 12/22/2018 12/07/2018  Glucose 70 - 99 mg/dL 86 113(H) 102(H)  BUN 6 - 20 mg/dL 11 10 15   Creatinine 0.44 - 1.00 mg/dL 0.86 0.99 1.01(H)  Sodium 135 - 145 mmol/L 139 141 142  Potassium 3.5 - 5.1 mmol/L 3.4(L) 4.1 4.2  Chloride 98 - 111 mmol/L 104 107 105  CO2 22 - 32 mmol/L 26 27 25   Calcium 8.9 - 10.3 mg/dL 9.1 9.4 9.7   Lab Results  Component Value Date   WBC 8.0 12/24/2018   HGB 14.8 12/24/2018   HCT 45.1 12/24/2018   MCV 95.6 12/24/2018   PLT 176 12/24/2018   EKG: LVH pattern  No Echo obtained      Assessment & Plan:  I personally reviewed all images and lab data in the [facility] system as well as any outside material available during this office visit and agree with the  radiology impressions.   COPD exacerbation ([place]) Ongoing COPD exacerbation with mucous plugging and failure to respond to [facility] inhaler secondary to inability to operate the device properly  Side effects from corticosteroid use orally Hypoxic respiratory failure and has not yet acquired the oxygen therapy  Plan Discontinue prednisone Administer Medrol 125 mg injection today Discontinue Breo inhaler Begin Dulera 200 mcg at 2 puffs twice daily Obtain Mucinex 1200 mg twice daily Obtain oxygen therapy as prescribed The patient was given a form for handicap placard and also a family leave form will be completed  An echocardiogram and pulmonary function studies yet are to be completed  Acute respiratory failure with hypoxia ([place]) Hypoxemia with respiratory failure Obtain for the patient oxygen therapy   Abbygael was seen today for follow-up.  Diagnoses and all orders for  this visit:  COPD exacerbation ([place]) -     ipratropium-albuterol (DUONEB) 0.5-2.5 (3) MG/3ML nebulizer solution 3 mL -     methylPREDNISolone sodium succinate (SOLU-MEDROL) 125 mg/2 mL injection 125 mg  Acute respiratory failure with hypoxia ([facility])  Other orders -     mometasone-formoterol (DULERA) 200-5 MCG/ACT AERO; Inhale 2 puffs into the lungs 2 (two) times daily. -     guaiFENesin (MUCINEX) 600 MG 12 hr tablet; Take 2 tablets (1,200 mg total) by mouth 2 (two) times daily.

## 2019-01-02 NOTE — Patient Instructions (Signed)
Start Mucinex to 600 mg tablets twice daily to thin out secretions Start Dulera 200 mcg strength at 2 puffs twice daily Stay on nebulized albuterol and ipratropium 4 times daily Discontinue prednisone A Medrol injection 125 mg was given today We will obtain oxygen once your insurance status goes through I will fill out your family leave paperwork and your disability parking placard paperwork was filled out today Return for recheck in 2 weeks

## 2019-01-02 NOTE — Assessment & Plan Note (Signed)
Hypoxemia with respiratory failure Obtain for the patient oxygen therapy

## 2019-01-03 ENCOUNTER — Telehealth: Payer: Self-pay | Admitting: *Deleted

## 2019-01-03 ENCOUNTER — Telehealth: Payer: Self-pay | Admitting: General Practice

## 2019-01-03 MED FILL — !DULERA 200 MCG/5 MCG INH: 200-5 | 30 days supply | Qty: 13 | Fill #0

## 2019-01-03 NOTE — Telephone Encounter (Signed)
Attempt made to contact patient to inform of message below. Left message on voicemai to return call.

## 2019-01-03 NOTE — Telephone Encounter (Signed)
Patient  Called because she needs a letter stating when she can get back to work. Patient says she is stressed financially because her job will not let her come back until there is a letter by provider stating when she can come back.   She would also like to check on the status of her documents. Documents were not up front for patient for pick up.  Please follow up

## 2019-01-03 NOTE — Telephone Encounter (Signed)
Note per Dr. Joya Gaskins from skype message Asencion Noble  [facility] 01/03/2019 1:33 PM  Madison Phillips ?  When I met with her yesterday I told her it depends on her response to treatment and the test results. I cannot give a definite date as of yet. Hopefully 2-3 weeks. Depends on test results   Looking at the letter he wrote for her, she has a note to be out of work from 1/6 for 2 weeks (01/08/2019) . It also appears that her appointment for the PFT procedure  is scheduled 01/09/2019. Will advise patient of message per Dr. Joya Gaskins and to follow up if test results are not in to get an extension on work note.  Please advise

## 2019-01-03 NOTE — Telephone Encounter (Signed)
Patient called because she says she is on leave and is frustrated because she has not been able to go back to work and does not have any financial assistance and wants to speak with someone to see if there are any resources that could be provided to her.

## 2019-01-03 NOTE — Telephone Encounter (Signed)
Pt was called and informed that she may pick up forms at front desk.  She voiced multiple questions regarding. Read off the answers indicated by Dr. Joya Gaskins. She request more clarity.   Is there a return to work date? If so, when can she return?   Is she on part time or full time duties?   Please advise

## 2019-01-03 NOTE — Telephone Encounter (Signed)
Spoke to patient, requesting another note stating she can be out for another 2 weeks until she has f/u with Dr. Joya Gaskins.

## 2019-01-04 ENCOUNTER — Ambulatory Visit (HOSPITAL_COMMUNITY)
Admission: RE | Admit: 2019-01-04 | Discharge: 2019-01-04 | Disposition: A | Discharge status: Home / Self Care | Payer: Medicaid Other | Source: Ambulatory Visit | Attending: Critical Care Medicine | Admitting: Critical Care Medicine

## 2019-01-04 ENCOUNTER — Encounter (INDEPENDENT_AMBULATORY_CARE_PROVIDER_SITE_OTHER): Payer: Self-pay | Admitting: Critical Care Medicine

## 2019-01-04 DIAGNOSIS — I10 Essential (primary) hypertension: Secondary | ICD-10-CM | POA: Diagnosis not present

## 2019-01-04 DIAGNOSIS — J441 Chronic obstructive pulmonary disease with (acute) exacerbation: Secondary | ICD-10-CM | POA: Diagnosis not present

## 2019-01-04 DIAGNOSIS — K219 Gastro-esophageal reflux disease without esophagitis: Secondary | ICD-10-CM | POA: Diagnosis not present

## 2019-01-04 DIAGNOSIS — J9601 Acute respiratory failure with hypoxia: Secondary | ICD-10-CM

## 2019-01-04 NOTE — Telephone Encounter (Signed)
See letter in chart

## 2019-01-04 NOTE — Progress Notes (Signed)
  Echocardiogram 2D Echocardiogram has been performed.  Madison Phillips [name] 01/04/2019, 3:26 PM

## 2019-01-04 NOTE — Telephone Encounter (Signed)
Left message on voicemail that letter was at front desk. May pick up at her convenience.

## 2019-01-09 ENCOUNTER — Ambulatory Visit: Payer: Self-pay | Attending: Family Medicine

## 2019-01-09 ENCOUNTER — Ambulatory Visit (HOSPITAL_COMMUNITY)
Admission: RE | Admit: 2019-01-09 | Discharge: 2019-01-09 | Disposition: A | Discharge status: Home / Self Care | Payer: Medicaid Other | Source: Ambulatory Visit | Attending: Critical Care Medicine | Admitting: Critical Care Medicine

## 2019-01-09 DIAGNOSIS — J441 Chronic obstructive pulmonary disease with (acute) exacerbation: Secondary | ICD-10-CM | POA: Diagnosis present

## 2019-01-09 LAB — PULMONARY FUNCTION TEST
DL/VA % pred: 69 %
DL/VA: 3.75 ml/min/mmHg/L
DLCO unc % pred: 17 %
DLCO unc: 5.58 ml/min/mmHg
FEF 25-75 Pre: 0.26 L/sec
FEF2575-%Pred-Pre: 9 %
FEV1-%Pred-Pre: 27 %
FEV1-Pre: 0.77 L
FEV1FVC-%Pred-Pre: 54 %
FEV6-%Pred-Pre: 45 %
FEV6-Pre: 1.55 L
FEV6FVC-%Pred-Pre: 90 %
FVC-%Pred-Pre: 49 %
FVC-Pre: 1.76 L
Pre FEV1/FVC ratio: 44 %
Pre FEV6/FVC Ratio: 88 %
RV % pred: 168 %
RV: 3.66 L
TLC % pred: 88 %
TLC: 5.29 L

## 2019-01-09 MED ORDER — ALBUTEROL SULFATE (2.5 MG/3ML) 0.083% IN NEBU
2.5000 mg | INHALATION_SOLUTION | Freq: Once | RESPIRATORY_TRACT | Status: AC
  Administered 2019-01-09: 2.5 mg via RESPIRATORY_TRACT

## 2019-01-09 NOTE — Telephone Encounter (Signed)
Call placed to patient to follow up on request for financial resources. A message for a return call was left.

## 2019-01-10 ENCOUNTER — Telehealth: Payer: Self-pay

## 2019-01-10 ENCOUNTER — Telehealth: Payer: Self-pay | Admitting: General Practice

## 2019-01-10 ENCOUNTER — Encounter: Payer: Self-pay | Admitting: Critical Care Medicine

## 2019-01-10 NOTE — Telephone Encounter (Signed)
I LVM that I was returning her call, to call me back

## 2019-01-10 NOTE — Telephone Encounter (Signed)
Patient called to speak with you. Please follow up. Patient says it is in regards to her insurance.

## 2019-01-10 NOTE — Telephone Encounter (Signed)
Call placed to [facility], spoke to [place] who could not explain if the patient had been contacted about the order for O2. She stated that without insurance the patient would need to private pay for the O2 and the cost would be approximately $190/month which includes a generic concentrator and an E tank.  It would cost $15 /refill of the tank.    Fax then received from [place], [facility] # [phone] x [phone]  requesting order for O2 to be signed by Dr Joya Gaskins.  The order was signed and faxed back. This CM spoke to [place] who stated that they have contacted the patient in the past and explained to her that it would be a private pay situation and she was not able to pay.    Call placed to the patient and explained that she would need to pay for O2 since she does not yet have insurance.  She said that she is not working and can't afford to pay for it.  She explained that she has a Insurance underwriter pending. Instructed her to call her DSS caseworker to check on the status of the application.  She also said that the financial counselor at the hospital told her to call if her situation changes. The patient stated that Dr Joya Gaskins is putting her out of work indefinitely. She said that she has the phone # for the financial counselor and will call her about the medicaid application status and current need for O2. The patient also  stated that she has concerns about her bills now that she is not working. Provided her with the phone # for [place] to discuss her financial issues.    Update provided to Dr Joya Gaskins

## 2019-01-12 ENCOUNTER — Telehealth: Payer: Self-pay

## 2019-01-12 MED FILL — IPRAT-ALBUT 0.5-3(2.5) MG/3: 0.5-2.5 (3) | 15 days supply | Qty: 180 | Fill #1

## 2019-01-12 NOTE — Telephone Encounter (Signed)
Attempted to contact the patient to discuss the hardship  program that [facility] [employer] offers for O2.  Call placed to # [phone] and a message was left requesting the patient call this CM back # [phone]

## 2019-01-15 ENCOUNTER — Telehealth: Payer: Self-pay

## 2019-01-15 NOTE — Telephone Encounter (Signed)
Call received from the patient.  Explained to her that [facility] is willing to work with her to provide home O2.  They would like to speak to her to discuss their hardship plan as they are able to provide the equipment based on the patient's financial status.  She was very enthused and gave this CM permission to release any information needed for the referral, including physician's orders, clinical notes, demographic and financial information to [place].   Call placed to [facility]/ [employer] and informed her that the patient would like to speak to a company representative about their hardship program.  Barnetta Chapel  requested that the patient's  Demographic and clinical information as well as the order be faxed to to # [phone]. Her email if neededis:  [email or web address]. Barnetta Chapel explained that someone from their office would contact the patient after the referral is received.

## 2019-01-15 NOTE — Telephone Encounter (Signed)
Attempted to contact the patient again to discuss the hardship  program that [facility] [employer] offers for O2.  Call placed to # [phone] and a message was left requesting the patient call this CM back # [phone]

## 2019-01-19 ENCOUNTER — Telehealth: Payer: Self-pay

## 2019-01-19 NOTE — Telephone Encounter (Signed)
Call placed to the patient. She said that she has received her O2 from [facility]. She was thrilled, stating that she is  feeling better and sleeping better than ever. She explained that she has the concentrator and tanks for 30 days free and would then have to pay $75/month after that if her medicaid is not approved.  She said that she does not have the money to pay for the O2 and hopes that her medicaid is approved. This CM encouraged her to call DSS to speak with her caseworker to check the status of her application and inform the person that she is now on O2.    Reminded her that she has an appointment with Dr Joya Gaskins on 01/23/2019.

## 2019-01-22 NOTE — Telephone Encounter (Signed)
Glad to see this

## 2019-01-22 NOTE — Progress Notes (Signed)
Subjective:    Patient ID: Madison Phillips, female    DOB: 26-Sep-1966, 53 y.o.   MRN: [id number]  52 y.o.F with COPD    past medical history significant for COPD, history of acoustic neuroma presenting  Pt is now non smoker since 2014  The patient just moved here from [place] a year ago.  She has no insurance.  She is had progressive dyspnea for the past year.  She has been in the emergency room and also hospitalized on several occasions since early December 2019.  She was also seen in our clinic December 05, 2018 and found to be severely hypoxic and thus sent to the emergency room for further evaluation.  She was subsequently admitted for several days and treated for COPD exacerbation discharged home only to return on 22 December 2017 for yet another COPD exacerbation she was given Ladona Ridgel and prednisone for which she did not fill and immediately return to the emergency room over the weekend on 12/24/2017.  She was admitted overnight and discharged early this morning.  She essentially is on no medications at home other than as needed gabapentin for her acoustic neuroma.  She has been on Brio inhaler along with Symbicort in the past neither which is really helping.  She previously did like Advair when she was in [place].  She is not smoke since 2014.  She states oral prednisone does help.  She has very significant anxiety component. If get up is severly dyspneic, any arm activity is sob   Distance work to door out of breath  The patient is not yet on any oxygen at home.  The home the patient has been and has had mold issues.  She has not had previous diagnosis of asthma or hyper allergic function.   01/02/2019 Since last visit: The patient feels the Memory Dance has not been useful and there is not been improvement with this.  She has been on Symbicort in the past without much improvement.  The patient has significant cough with retained secretions.  She has not yet received the oxygen therapy as  she is waiting on insurance approval.  She is on a medical leave at this time.    She brings a physician's note from 2014 which showed she had a room air saturation of 97% but moderate obstruction on spirometry.  The exact numbers from the spirometry are not available for my review.  She complains that the prednisone is causing weight gain and agitation.  01/23/2019 Here for Copd f/u.   Congestion:  Uses the dulera and helps better.  Worse in the morning.  No Flutter.  Congested in the AM  Uses nebs as well 3-4x.   Alpha one    Shortness of Breath  This is a chronic problem. The current episode started more than 1 month ago. The problem occurs constantly. The problem has been rapidly worsening. Associated symptoms include chest pain, orthopnea, PND, sputum production and wheezing. Pertinent negatives include no ear pain, fever, headaches, hemoptysis, leg swelling, rash, rhinorrhea, sore throat, swollen glands, syncope or vomiting. The symptoms are aggravated by any activity, animal exposure, lying flat, emotional upset, exercise, fumes, odors, pollens, occupational exposure, URIs, smoke and weather changes (mold in home, just cleaned out , works for [employer].  local office ). Associated symptoms comments: Severe dyspnea with talking  Chest is tight Mucus is yellow and white abd is bloating . Risk factors include smoking. She has tried beta agonist inhalers and oral  steroids for the symptoms. The treatment provided moderate relief. Her past medical history is significant for COPD and pneumonia. There is no history of allergies, aspirin allergies, asthma, CAD, a heart failure or PE.    Past Medical History:  Diagnosis Date  . COPD (chronic obstructive pulmonary disease) ([place])   . GERD (gastroesophageal reflux disease) 12/25/2018  . 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 Left ear hearing loss 12/05/2018     Family History  Problem Relation Age of Onset  . Hypothyroidism Mother   . Diabetes  Mellitus II Maternal Aunt   . Diabetes Maternal Aunt   . Cancer Father      Social History   Socioeconomic History  . Marital status: Single    Spouse name: Not on file  . Number of children: Not on file  . Years of education: Not on file  . Highest education level: Not on file  Occupational History  . Not on file  Social Needs  . Financial resource strain: Not on file  . Food insecurity:    Worry: Not on file    Inability: Not on file  . Transportation needs:    Medical: Not on file    Non-medical: Not on file  Tobacco Use  . Smoking status: Former Research scientist (life sciences)  . Smokeless tobacco: Never Used  Substance and Sexual Activity  . Alcohol use: Yes    Alcohol/week: 2.0 - 3.0 standard drinks    Types: 2 - 3 Cans of beer per week    Comment: a night  . Drug use: Not Currently  . Sexual activity: Yes  Lifestyle  . Physical activity:    Days per week: Not on file    Minutes per session: Not on file  . Stress: Not on file  Relationships  . Social connections:    Talks on phone: Not on file    Gets together: Not on file    Attends religious service: Not on file    Active member of club or organization: Not on file    Attends meetings of clubs or organizations: Not on file    Relationship status: Not on file  . Intimate partner violence:    Fear of current or ex partner: Not on file    Emotionally abused: Not on file    Physically abused: Not on file    Forced sexual activity: Not on file  Other Topics Concern  . Not on file  Social History Narrative  . Not on file     No Known Allergies   Outpatient Medications Prior to Visit  Medication Sig Dispense Refill  . albuterol (PROVENTIL HFA;VENTOLIN HFA) 108 (90 Base) MCG/ACT inhaler Inhale 1-2 puffs into the lungs every 6 (six) hours as needed for wheezing or shortness of breath. 1 Inhaler 0  . amLODipine (NORVASC) 10 MG tablet Take 1 tablet (10 mg total) by mouth daily. 30 tablet 11  . ipratropium-albuterol (DUONEB) 0.5-2.5  (3) MG/3ML SOLN Use 4 times daily on schedule, may take two additional treatments as needed 360 mL 6  . mometasone-formoterol (DULERA) 200-5 MCG/ACT AERO Inhale 2 puffs into the lungs 2 (two) times daily. 1 Inhaler 4  . pantoprazole (PROTONIX) 40 MG tablet Take 1 tablet (40 mg total) by mouth daily before breakfast. 30 tablet 6  . benzonatate (TESSALON) 100 MG capsule Take 1 capsule (100 mg total) by mouth 3 (three) times daily as needed for cough. 30 capsule 0  . doxycycline (VIBRA-TABS) 100 MG tablet Take 1  tablet (100 mg total) by mouth 2 (two) times daily. 14 tablet 0  . guaiFENesin (MUCINEX) 600 MG 12 hr tablet Take 2 tablets (1,200 mg total) by mouth 2 (two) times daily. 100 tablet 6   No facility-administered medications prior to visit.      Review of Systems  Constitutional: Positive for fatigue. Negative for fever.  HENT: Positive for sneezing and trouble swallowing. Negative for ear pain, mouth sores, nosebleeds, postnasal drip, rhinorrhea, sinus pressure, sinus pain and sore throat.   Eyes: Negative for pain, discharge, redness and itching.  Respiratory: Positive for cough, sputum production, chest tightness, shortness of breath and wheezing. Negative for hemoptysis.        Cough is dry, mucus is yellow  Cardiovascular: Positive for chest pain, palpitations, orthopnea and PND. Negative for leg swelling and syncope.  Gastrointestinal: Positive for diarrhea. Negative for blood in stool and vomiting.       Burp and gassy  Genitourinary:       Incontinence with cough  Skin: Negative for rash.  Neurological: Negative for headaches.  Hematological: Bruises/bleeds easily.  Psychiatric/Behavioral: Positive for sleep disturbance. Negative for self-injury and suicidal ideas. The patient is nervous/anxious.        Objective:   Physical Exam  Vitals:   01/23/19 1011  BP: 135/86  Pulse: 90  Resp: 18  Temp: 98.6 F (37 C)  TempSrc: Oral  SpO2: 95%  Weight: 174 lb (78.9 kg)    Height: 5\' 10"  (1.778 m)    Gen: Pleasant, well-nourished, in no distress, less Anxious affect  ENT: No lesions,  mouth clear,  oropharynx clear, no postnasal drip  Neck: No JVD, no TMG, no carotid bruits  Lungs: No use of accessory muscles, no dullness to percussion, distant BS, improved BS  Cardiovascular: RRR, heart sounds normal, no murmur or gallops, no peripheral edema  Abdomen: soft and NT, no HSM,  BS normal  Musculoskeletal: No deformities, no cyanosis or clubbing  Neuro: alert, non focal  Skin: Warm, no lesions or rashes  No results found.        BMP Latest Ref Rng & Units 12/24/2018 12/22/2018 12/07/2018  Glucose 70 - 99 mg/dL 86 113(H) 102(H)  BUN 6 - 20 mg/dL 11 10 15   Creatinine 0.44 - 1.00 mg/dL 0.86 0.99 1.01(H)  Sodium 135 - 145 mmol/L 139 141 142  Potassium 3.5 - 5.1 mmol/L 3.4(L) 4.1 4.2  Chloride 98 - 111 mmol/L 104 107 105  CO2 22 - 32 mmol/L 26 27 25   Calcium 8.9 - 10.3 mg/dL 9.1 9.4 9.7   Lab Results  Component Value Date   WBC 8.0 12/24/2018   HGB 14.8 12/24/2018   HCT 45.1 12/24/2018   MCV 95.6 12/24/2018   PLT 176 12/24/2018   EKG: LVH pattern  1/16 Study Conclusions  - Left ventricle: The cavity size was normal. Systolic function was   normal. The estimated ejection fraction was in the range of 60%   to 65%. Wall motion was normal; there were no regional wall   motion abnormalities. Left ventricular diastolic function   parameters were normal. - Aortic valve: Transvalvular velocity was within the normal range.   There was no stenosis. There was no regurgitation. - Mitral valve: Transvalvular velocity was within the normal range.   There was no evidence for stenosis. There was trivial   regurgitation. - Left atrium: The atrium was mildly dilated. - Right ventricle: The cavity size was normal. Wall thickness was   normal.  Systolic function was normal. - Atrial septum: No defect or patent foramen ovale was identified. - Tricuspid  valve: There was mild regurgitation. - Pulmonary arteries: Systolic pressure was within the normal   range. PA peak pressure: 25 mm Hg (S).  PFTS  01/10/19 COPD Stage 4 Gold   FeV1 27% predicted  RV 168% pred   TLC 88% predicted       Assessment & Plan:  I personally reviewed all images and lab data in the [facility] system as well as any outside material available during this office visit and agree with the  radiology impressions.   COPD with emphysema ([place]) COPD with chronic bronchitic and emphysematous components now improved with current bronchodilator program  Recommendations Continue Dulera 2 puffs twice daily Continue DuoNeb 3 times daily Obtain for the patient a flutter valve Continue oxygen therapy 2 L continuous Once the patient obtains Medicaid will pursue pulmonary rehab   Chronic respiratory failure with hypoxia ([facility]) Chronic obstructive lung disease with asthmatic bronchitic and emphysematous components with chronic respiratory failure due to hypoxemia  Improved with oxygen therapy  Continue oxygen 2 L continuous   Madison Phillips was seen today for follow-up.  Diagnoses and all orders for this visit:  Centrilobular emphysema ([place]) -     Alpha-1-antitrypsin; Future -     Alpha-1-antitrypsin  COPD exacerbation ([place])  Chronic respiratory failure with hypoxia (HCC)    We will also check an alpha-1 antitrypsin level given the patient's level of centrilobular emphysema

## 2019-01-23 ENCOUNTER — Encounter: Payer: Self-pay | Admitting: Critical Care Medicine

## 2019-01-23 ENCOUNTER — Ambulatory Visit: Payer: Medicaid Other | Attending: Critical Care Medicine | Admitting: Critical Care Medicine

## 2019-01-23 ENCOUNTER — Ambulatory Visit: Payer: Self-pay | Attending: Family Medicine | Admitting: Licensed Clinical Social Worker

## 2019-01-23 ENCOUNTER — Telehealth: Payer: Self-pay

## 2019-01-23 VITALS — BP 135/86 | HR 90 | Temp 98.6°F | Resp 18 | Ht 70.0 in | Wt 174.0 lb

## 2019-01-23 DIAGNOSIS — Z598 Other problems related to housing and economic circumstances: Secondary | ICD-10-CM

## 2019-01-23 DIAGNOSIS — J432 Centrilobular emphysema: Secondary | ICD-10-CM

## 2019-01-23 DIAGNOSIS — J9611 Chronic respiratory failure with hypoxia: Secondary | ICD-10-CM | POA: Diagnosis not present

## 2019-01-23 DIAGNOSIS — Z599 Problem related to housing and economic circumstances, unspecified: Secondary | ICD-10-CM

## 2019-01-23 DIAGNOSIS — J441 Chronic obstructive pulmonary disease with (acute) exacerbation: Secondary | ICD-10-CM

## 2019-01-23 DIAGNOSIS — F419 Anxiety disorder, unspecified: Secondary | ICD-10-CM

## 2019-01-23 NOTE — Assessment & Plan Note (Signed)
Chronic obstructive lung disease with asthmatic bronchitic and emphysematous components with chronic respiratory failure due to hypoxemia  Improved with oxygen therapy  Continue oxygen 2 L continuous

## 2019-01-23 NOTE — Patient Instructions (Signed)
Continue all your inhaled medications and oxygen  We will obtain for you a flutter valve to use twice daily  Once your Medicaid goes through we will make referral to pulmonary rehab  Stop by the lab today to have an alpha-1 antitrypsin blood level drawn  Keep your primary care appointment that is upcoming  Return to see Dr. Joya Gaskins in 2 months

## 2019-01-23 NOTE — Assessment & Plan Note (Signed)
COPD with chronic bronchitic and emphysematous components now improved with current bronchodilator program  Recommendations Continue Dulera 2 puffs twice daily Continue DuoNeb 3 times daily Obtain for the patient a flutter valve Continue oxygen therapy 2 L continuous Once the patient obtains Medicaid will pursue pulmonary rehab

## 2019-01-23 NOTE — Telephone Encounter (Signed)
Met with the patient when she was in the clinic for her appointment today. She explained that she received a call from [facility] and was informed that she will not owe $75 /month for the O2 after the first month.  She is covered at 100%.  She also said that she continues to work with Ms Zigmund Daniel at [place] regarding her medicaid application. She said that she has informed Ms Zigmund Daniel that she is now on O2 and is no longer able to work. This CM encouraged her to continue to work with Ms Zigmund Daniel to insure that the application is complete. This CM to try to obtain information about medicaid application from the financial counselor. The patient also stated that she is in the process of applying for disability.  This CM also provided the patient with the listing of local food pantries. The patient met with Christa See LCSW while in the clinic,   Call placed to [facility] # [phone], spoke to [place] who confirmed that the patient owes nothing for her O2 and has no deductible and is covered at 100% .  Call placed to [place], Hoy Finlay. She stated that the [facility] application is pending. She is not able to share any information about the application with this CM, she is only able to speak to the patient. She did confirm that the patient has been working with [place] # [phone] to complete the medicaid application.

## 2019-01-24 LAB — ALPHA-1-ANTITRYPSIN: A-1 Antitrypsin: 133 mg/dL (ref 101–187)

## 2019-01-24 NOTE — Telephone Encounter (Signed)
-----   Message from Elsie Stain, MD sent at 01/24/2019  1:19 PM EST ----- [place]  Can you call ms Madison Phillips and tell her the alpha 1 antitrypsin level is normal  No deficiency to cause the emphysema

## 2019-01-24 NOTE — BH Specialist Note (Signed)
Integrated Behavioral Health Initial Visit  MRN: [id number] Name: Madison Phillips  Number of [place] Clinician visits:: 1/6 Session Start time: 10:40 AM  Session End time: 10:55 AM Total time: 15 minutes  Type of Service: [place] Interpretor:No. Interpretor Name and Language: N/A   Warm Hand Off Completed.       SUBJECTIVE: Madison Phillips is a 53 y.o. female accompanied by self Patient was referred by Dr. Joya Gaskins for financial resources. Patient reports the following symptoms/concerns: Pt shared that she has experienced an increase in anxiety due to financial strain. She receives limited support from family and was recently put out of work by Provider due to severity of medical condition Duration of problem: 1 month; Severity of problem: mild  OBJECTIVE: Mood: Pleasant and Affect: Appropriate Risk of harm to self or others: No plan to harm self or others  LIFE CONTEXT: Family and Social: Pt receives limited support School/Work: Pt has applied for [employer] and disability. She was provided food resources by clinic's RN CM Self-Care: Pt denies substance use. Pt shared she encourages self to cope with stressors and is proactive about following through with benefit applications Life Changes: Pt shared that she has experienced an increase in anxiety due to financial strain. She receives limited support from family and was recently put out of work by Provider due to severity of medical condition  GOALS ADDRESSED: Patient will: 1. Reduce symptoms of: anxiety and stress 2. Increase knowledge and/or ability of: coping skills and healthy habits  3. Demonstrate ability to: Increase healthy adjustment to current life circumstances and Increase adequate support systems for patient/family  INTERVENTIONS: Interventions utilized: Solution-Focused Strategies, Supportive Counseling and Psychoeducation and/or Health Education   Standardized Assessments completed: GAD-7 and PHQ 2&9  ASSESSMENT: Patient currently experiencing anxiety triggered by financial strain. She receives limited support from family and was recently put out of work by Provider due to severity of medical condition.    Patient may benefit from linkage to community resources. LCSWA educated pt on the correlation between one's physical and mental health. Support and encouragement was provided. Pt has good insight and motivation to follow up with community agencies regarding benefits. Clinic's RN CM provided pt with food insecurity resources. No additional concerns noted.   PLAN: 1. Follow up with behavioral health clinician on : Pt was encouraged to contact LCSWA if symptoms worsen or fail to improve to schedule [facility] appointments at [facility]. 2. Behavioral recommendations: LCSWA recommends that pt apply healthy coping skills discussed. Pt is encouraged to schedule follow up appointment with LCSWA 3. Referral(s): [place] (In Clinic) 4. "From scale of 1-10, how likely are you to follow plan?":   Rebekah Chesterfield, LCSW 01/24/2019 11:58 AM

## 2019-01-24 NOTE — Telephone Encounter (Signed)
Patient verified DOB Patient is aware of lab being normal. No further questions.

## 2019-01-30 ENCOUNTER — Telehealth: Payer: Self-pay

## 2019-01-30 ENCOUNTER — Encounter: Payer: Self-pay | Admitting: Family Medicine

## 2019-01-30 ENCOUNTER — Other Ambulatory Visit: Payer: Self-pay | Admitting: Family Medicine

## 2019-01-30 ENCOUNTER — Ambulatory Visit: Payer: Medicaid Other | Attending: Family Medicine | Admitting: Family Medicine

## 2019-01-30 VITALS — BP 136/91 | HR 82 | Temp 98.3°F | Ht 70.0 in | Wt 178.0 lb

## 2019-01-30 DIAGNOSIS — Z780 Asymptomatic menopausal state: Secondary | ICD-10-CM | POA: Insufficient documentation

## 2019-01-30 DIAGNOSIS — R252 Cramp and spasm: Secondary | ICD-10-CM

## 2019-01-30 DIAGNOSIS — K219 Gastro-esophageal reflux disease without esophagitis: Secondary | ICD-10-CM | POA: Insufficient documentation

## 2019-01-30 DIAGNOSIS — Z79899 Other long term (current) drug therapy: Secondary | ICD-10-CM | POA: Insufficient documentation

## 2019-01-30 DIAGNOSIS — J449 Chronic obstructive pulmonary disease, unspecified: Secondary | ICD-10-CM | POA: Diagnosis not present

## 2019-01-30 DIAGNOSIS — Z124 Encounter for screening for malignant neoplasm of cervix: Secondary | ICD-10-CM

## 2019-01-30 DIAGNOSIS — Z809 Family history of malignant neoplasm, unspecified: Secondary | ICD-10-CM | POA: Diagnosis not present

## 2019-01-30 DIAGNOSIS — Z01419 Encounter for gynecological examination (general) (routine) without abnormal findings: Secondary | ICD-10-CM | POA: Diagnosis not present

## 2019-01-30 DIAGNOSIS — Z87891 Personal history of nicotine dependence: Secondary | ICD-10-CM | POA: Diagnosis not present

## 2019-01-30 DIAGNOSIS — Z1239 Encounter for other screening for malignant neoplasm of breast: Secondary | ICD-10-CM

## 2019-01-30 NOTE — Progress Notes (Signed)
Patient ID: Madison Phillips, female   DOB: 06/21/1966, 53 y.o.   MRN: [id number]   After patient's well exam, patient requested prescription for multivitamin her potassium supplement from nurse that she has had increased muscle cramping in her legs and feels that her potassium may be low.  Patient will have BMP to check potassium levels and patient encouraged to increase potassium rich foods.  Patient may obtain an over-the-counter multivitamin.  Patient will be notified of the lab results and if further treatment is needed based on the results

## 2019-01-30 NOTE — Progress Notes (Signed)
Subjective:    Patient ID: Madison Phillips, female    DOB: [birth year], 53 y.o.   MRN: [id number]  HPI      53 yo female seen for annual well exam.  Patient reports that she is postmenopausal.  Patient believes that her menses stopped at about the age of 32.  Patient has had no postmenopausal bleeding.  Patient does not recall any history of abnormal Pap smears.  Patient has not found any abnormalities on self breast exam.  Patient states that she is due for mammogram.  Patient denies any history of abnormal mammograms.  Patient denies any new or acute symptoms at today's visit other than a mild increase in shortness of breath.  Past Medical History:  Diagnosis Date  . COPD (chronic obstructive pulmonary disease) ([place])   . GERD (gastroesophageal reflux disease) 12/25/2018  . 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 Left ear hearing loss 12/05/2018   Past Surgical History:  Procedure Laterality Date  . cyber knife    . ectopic pregnanacy     Family History  Problem Relation Age of Onset  . Hypothyroidism Mother   . Diabetes Mellitus II Maternal Aunt   . Diabetes Maternal Aunt   . Cancer Father    Social History   Tobacco Use  . Smoking status: Former Research scientist (life sciences)  . Smokeless tobacco: Never Used  Substance Use Topics  . Alcohol use: Yes    Alcohol/week: 2.0 - 3.0 standard drinks    Types: 2 - 3 Cans of beer per week    Comment: a night  . Drug use: Not Currently   No Known Allergies    Current Outpatient Medications:  .  albuterol (PROVENTIL HFA;VENTOLIN HFA) 108 (90 Base) MCG/ACT inhaler, Inhale 1-2 puffs into the lungs every 6 (six) hours as needed for wheezing or shortness of breath., Disp: 1 Inhaler, Rfl: 0 .  amLODipine (NORVASC) 10 MG tablet, Take 1 tablet (10 mg total) by mouth daily., Disp: 30 tablet, Rfl: 11 .  ipratropium-albuterol (DUONEB) 0.5-2.5 (3) MG/3ML SOLN, Use 4 times daily on schedule, may take two additional treatments as needed, Disp: 360 mL, Rfl: 6 .   mometasone-formoterol (DULERA) 200-5 MCG/ACT AERO, Inhale 2 puffs into the lungs 2 (two) times daily., Disp: 1 Inhaler, Rfl: 4 .  pantoprazole (PROTONIX) 40 MG tablet, Take 1 tablet (40 mg total) by mouth daily before breakfast., Disp: 30 tablet, Rfl: 6   Review of Systems  Constitutional: Positive for fatigue (mild). Negative for chills and fever.  HENT: Positive for hearing loss (left ear). Negative for sore throat and trouble swallowing.   Eyes: Negative for photophobia and visual disturbance.  Respiratory: Positive for shortness of breath. Negative for cough and wheezing.   Gastrointestinal: Negative for abdominal pain, blood in stool, constipation, diarrhea and nausea.  Endocrine: Negative for polydipsia, polyphagia and polyuria.  Genitourinary: Negative for difficulty urinating, dysuria and frequency.  Musculoskeletal: Negative for arthralgias, back pain and gait problem.  Neurological: Negative for dizziness and headaches.  Hematological: Negative for adenopathy. Does not bruise/bleed easily.       Objective:   Physical Exam BP (!) 136/91 (BP Location: Right Arm, Patient Position: Sitting, Cuff Size: Normal)   Pulse 82   Temp 98.3 F (36.8 C) (Oral)   Ht 5\' 10"  (1.778 m)   Wt 178 lb (80.7 kg)   SpO2 93% Comment: 2L  BMI 25.54 kg/m Nurse's notes and vital signs reviewed General-well-nourished, well-developed female in no acute distress.  Patient  is wearing oxygen by nasal cannula ENT-right TM normal, left TM obscured by impacted cerumen.  Patient with edema/erythema of the nasal canal/nasal mucosa Patient with mild posterior pharynx erythema Neck-supple, no lymphadenopathy, patient with large neck size with possible thyromegaly Cardiovascular-regular rate and rhythm Lungs-clear to auscultation bilaterally Breast exam- no axillary adenopathy, no skin changes, no nipple discharge and no palpable abnormalities bilaterally Abdomen-soft, nontender Extremities-no edema GU- normal  appearance to the external genitalia, patient with mild erythema of the vaginal canal and surface of the cervix.  Patient with some bleeding with collection of cells for Pap smear.  No adnexal tenderness or masses, no cervical motion tenderness         Assessment & Plan:  1. Well female exam with routine gynecological exam Patient with well female exam at today's visit.  Patient reports no history of abnormal Pap smears and no abnormal mammograms.  Patient had Pap done at today's visit and will be notified of the results.  Patient given handout on health maintenance for postmenopausal women.  Patient will be given scholarship information to obtain a mammogram.  Patient believes that her tetanus immunization is up-to-date and patient states that she has had colonoscopy within the past 3 years 2. Screening for breast cancer Patient given information for scholarship for screening mammogram  3. Screening for cervical cancer Pap smear done at today's visit.  Patient likely has some atrophic vaginitis as she is now postmenopausal.  Patient denies any issues with vaginal itching and bimanual exam was normal.  Patient will be notified of results and if any further follow-up is needed based on the results  An After Visit Summary was printed and given to the patient.  Return in about 2 months (around 03/31/2019) for chronic issues-2 months or keep scheduled.

## 2019-01-30 NOTE — Telephone Encounter (Signed)
Met with patient when she was in the clinic today. She was quite anxious. She explained that she was informed by medicaid that she needed to apply for disability first.  She is in the process of trying to reach her DSS caseworker - Ms Verlin Fester # [phone] and the financial counselor at [facility] to inquire if DSS has all of her updated medical information including the fact that she is now on O2. She has already applied for disability.  She completed a referral to [employer] [facility] [place] to see if they will be able to provide any assistance with expediting her medicaid. She is anxious to begin pulmonary rehab.  She was given a flutter device while in the clinic and was instructed in the use/care of the device.  She correctly demonstrated how to use it once and then said she wanted to put it away and would use at home.

## 2019-01-30 NOTE — Patient Instructions (Signed)
Health Maintenance for Postmenopausal Women Menopause is a normal process in which your reproductive ability comes to an end. This process happens gradually over a span of months to years, usually between the ages of 62 and 89. Menopause is complete when you have missed 12 consecutive menstrual periods. It is important to talk with your health care provider about some of the most common conditions that affect postmenopausal women, such as heart disease, cancer, and bone loss (osteoporosis). Adopting a healthy lifestyle and getting preventive care can help to promote your health and wellness. Those actions can also lower your chances of developing some of these common conditions. What should I know about menopause? During menopause, you may experience a number of symptoms, such as:  Moderate-to-severe hot flashes.  Night sweats.  Decrease in sex drive.  Mood swings.  Headaches.  Tiredness.  Irritability.  Memory problems.  Insomnia. Choosing to treat or not to treat menopausal changes is an individual decision that you make with your health care provider. What should I know about hormone replacement therapy and supplements? Hormone therapy products are effective for treating symptoms that are associated with menopause, such as hot flashes and night sweats. Hormone replacement carries certain risks, especially as you become older. If you are thinking about using estrogen or estrogen with progestin treatments, discuss the benefits and risks with your health care provider. What should I know about heart disease and stroke? Heart disease, heart attack, and stroke become more likely as you age. This may be due, in part, to the hormonal changes that your body experiences during menopause. These can affect how your body processes dietary fats, triglycerides, and cholesterol. Heart attack and stroke are both medical emergencies. There are many things that you can do to help prevent heart disease  and stroke:  Have your blood pressure checked at least every 1-2 years. High blood pressure causes heart disease and increases the risk of stroke.  If you are 79-72 years old, ask your health care provider if you should take aspirin to prevent a heart attack or a stroke.  Do not use any tobacco products, including cigarettes, chewing tobacco, or electronic cigarettes. If you need help quitting, ask your health care provider.  It is important to eat a healthy diet and maintain a healthy weight. ? Be sure to include plenty of vegetables, fruits, low-fat dairy products, and lean protein. ? Avoid eating foods that are high in solid fats, added sugars, or salt (sodium).  Get regular exercise. This is one of the most important things that you can do for your health. ? Try to exercise for at least 150 minutes each week. The type of exercise that you do should increase your heart rate and make you sweat. This is known as moderate-intensity exercise. ? Try to do strengthening exercises at least twice each week. Do these in addition to the moderate-intensity exercise.  Know your numbers.Ask your health care provider to check your cholesterol and your blood glucose. Continue to have your blood tested as directed by your health care provider.  What should I know about cancer screening? There are several types of cancer. Take the following steps to reduce your risk and to catch any cancer development as early as possible. Breast Cancer  Practice breast self-awareness. ? This means understanding how your breasts normally appear and feel. ? It also means doing regular breast self-exams. Let your health care provider know about any changes, no matter how small.  If you are 40 or  older, have a clinician do a breast exam (clinical breast exam or CBE) every year. Depending on your age, family history, and medical history, it may be recommended that you also have a yearly breast X-ray (mammogram).  If you  have a family history of breast cancer, talk with your health care provider about genetic screening.  If you are at high risk for breast cancer, talk with your health care provider about having an MRI and a mammogram every year.  Breast cancer (BRCA) gene test is recommended for women who have family members with BRCA-related cancers. Results of the assessment will determine the need for genetic counseling and BRCA1 and for BRCA2 testing. BRCA-related cancers include these types: ? Breast. This occurs in males or females. ? Ovarian. ? Tubal. This may also be called fallopian tube cancer. ? Cancer of the abdominal or pelvic lining (peritoneal cancer). ? Prostate. ? Pancreatic. Cervical, Uterine, and Ovarian Cancer Your health care provider may recommend that you be screened regularly for cancer of the pelvic organs. These include your ovaries, uterus, and vagina. This screening involves a pelvic exam, which includes checking for microscopic changes to the surface of your cervix (Pap test).  For women ages 21-65, health care providers may recommend a pelvic exam and a Pap test every three years. For women ages 39-65, they may recommend the Pap test and pelvic exam, combined with testing for human papilloma virus (HPV), every five years. Some types of HPV increase your risk of cervical cancer. Testing for HPV may also be done on women of any age who have unclear Pap test results.  Other health care providers may not recommend any screening for nonpregnant women who are considered low risk for pelvic cancer and have no symptoms. Ask your health care provider if a screening pelvic exam is right for you.  If you have had past treatment for cervical cancer or a condition that could lead to cancer, you need Pap tests and screening for cancer for at least 20 years after your treatment. If Pap tests have been discontinued for you, your risk factors (such as having a new sexual partner) need to be reassessed  to determine if you should start having screenings again. Some women have medical problems that increase the chance of getting cervical cancer. In these cases, your health care provider may recommend that you have screening and Pap tests more often.  If you have a family history of uterine cancer or ovarian cancer, talk with your health care provider about genetic screening.  If you have vaginal bleeding after reaching menopause, tell your health care provider.  There are currently no reliable tests available to screen for ovarian cancer. Lung Cancer Lung cancer screening is recommended for adults 57-50 years old who are at high risk for lung cancer because of a history of smoking. A yearly low-dose CT scan of the lungs is recommended if you:  Currently smoke.  Have a history of at least 30 pack-years of smoking and you currently smoke or have quit within the past 15 years. A pack-year is smoking an average of one pack of cigarettes per day for one year. Yearly screening should:  Continue until it has been 15 years since you quit.  Stop if you develop a health problem that would prevent you from having lung cancer treatment. Colorectal Cancer  This type of cancer can be detected and can often be prevented.  Routine colorectal cancer screening usually begins at age 12 and continues through  age 75.  If you have risk factors for colon cancer, your health care provider may recommend that you be screened at an earlier age.  If you have a family history of colorectal cancer, talk with your health care provider about genetic screening.  Your health care provider may also recommend using home test kits to check for hidden blood in your stool.  A small camera at the end of a tube can be used to examine your colon directly (sigmoidoscopy or colonoscopy). This is done to check for the earliest forms of colorectal cancer.  Direct examination of the colon should be repeated every 5-10 years until  age 75. However, if early forms of precancerous polyps or small growths are found or if you have a family history or genetic risk for colorectal cancer, you may need to be screened more often. Skin Cancer  Check your skin from head to toe regularly.  Monitor any moles. Be sure to tell your health care provider: ? About any new moles or changes in moles, especially if there is a change in a mole's shape or color. ? If you have a mole that is larger than the size of a pencil eraser.  If any of your family members has a history of skin cancer, especially at a young age, talk with your health care provider about genetic screening.  Always use sunscreen. Apply sunscreen liberally and repeatedly throughout the day.  Whenever you are outside, protect yourself by wearing long sleeves, pants, a wide-brimmed hat, and sunglasses. What should I know about osteoporosis? Osteoporosis is a condition in which bone destruction happens more quickly than new bone creation. After menopause, you may be at an increased risk for osteoporosis. To help prevent osteoporosis or the bone fractures that can happen because of osteoporosis, the following is recommended:  If you are 19-50 years old, get at least 1,000 mg of calcium and at least 600 mg of vitamin D per day.  If you are older than age 50 but younger than age 70, get at least 1,200 mg of calcium and at least 600 mg of vitamin D per day.  If you are older than age 70, get at least 1,200 mg of calcium and at least 800 mg of vitamin D per day. Smoking and excessive alcohol intake increase the risk of osteoporosis. Eat foods that are rich in calcium and vitamin D, and do weight-bearing exercises several times each week as directed by your health care provider. What should I know about how menopause affects my mental health? Depression may occur at any age, but it is more common as you become older. Common symptoms of depression include:  Low or sad  mood.  Changes in sleep patterns.  Changes in appetite or eating patterns.  Feeling an overall lack of motivation or enjoyment of activities that you previously enjoyed.  Frequent crying spells. Talk with your health care provider if you think that you are experiencing depression. What should I know about immunizations? It is important that you get and maintain your immunizations. These include:  Tetanus, diphtheria, and pertussis (Tdap) booster vaccine.  Influenza every year before the flu season begins.  Pneumonia vaccine.  Shingles vaccine. Your health care provider may also recommend other immunizations. This information is not intended to replace advice given to you by your health care provider. Make sure you discuss any questions you have with your health care provider. Document Released: 01/28/2006 Document Revised: 06/25/2016 Document Reviewed: 09/09/2015 Elsevier Interactive Patient Education    2019 [place].

## 2019-01-30 NOTE — Telephone Encounter (Signed)
Call received from the patient stating that she would give Ms Lanell Matar, financial counseling at [facility], authorization to speak to this CM . She explained that Ms Venetia Maxon is a DSS employee # [id number] contracted to work at the hospital. She said that she spoke to Ms Verlin Fester at [facility] and she only handles the food stamps.   Call placed to Ms Venetia Maxon. She stated that the claim has been submitted to [facility] and she is not able to obtain any information about the claim until a decision is made.  She said that the patient's medical records are sent to the Disability Determination Service in [place] # [phone].  She has no way of knowing if any information was submitted regarding the patient's need for O2.  She said that the patient would need to call and check.    Call placed to patient and informed her of the conversation with Ms Venetia Maxon and provided her with the phone # for Disability Determination. She said that she will call and she has her case number.

## 2019-02-01 LAB — CYTOLOGY - PAP
Diagnosis: NEGATIVE
HPV: NOT DETECTED

## 2019-02-02 ENCOUNTER — Telehealth: Payer: Self-pay | Admitting: *Deleted

## 2019-02-02 NOTE — Telephone Encounter (Signed)
Patient verified DOB Patient is aware of PAP being normal.

## 2019-02-02 NOTE — Addendum Note (Signed)
Addended by: Octaviano Glow on: 02/02/2019 09:32 AM   Modules accepted: Orders

## 2019-02-02 NOTE — Telephone Encounter (Signed)
-----   Message from [name], MD sent at 02/01/2019 10:28 PM EST ----- Notify patient of normal pap

## 2019-02-03 ENCOUNTER — Other Ambulatory Visit: Payer: Self-pay

## 2019-02-03 ENCOUNTER — Telehealth: Payer: Self-pay | Admitting: Family Medicine

## 2019-02-03 ENCOUNTER — Emergency Department (HOSPITAL_COMMUNITY)
Admission: EM | Admit: 2019-02-03 | Discharge: 2019-02-03 | Disposition: A | Discharge status: Home / Self Care | Payer: Medicaid Other | Attending: Emergency Medicine | Admitting: Emergency Medicine

## 2019-02-03 ENCOUNTER — Encounter (HOSPITAL_COMMUNITY): Payer: Self-pay | Admitting: Emergency Medicine

## 2019-02-03 DIAGNOSIS — Z79899 Other long term (current) drug therapy: Secondary | ICD-10-CM | POA: Diagnosis not present

## 2019-02-03 DIAGNOSIS — Z87891 Personal history of nicotine dependence: Secondary | ICD-10-CM | POA: Diagnosis not present

## 2019-02-03 DIAGNOSIS — R799 Abnormal finding of blood chemistry, unspecified: Secondary | ICD-10-CM | POA: Insufficient documentation

## 2019-02-03 DIAGNOSIS — J449 Chronic obstructive pulmonary disease, unspecified: Secondary | ICD-10-CM | POA: Insufficient documentation

## 2019-02-03 DIAGNOSIS — I10 Essential (primary) hypertension: Secondary | ICD-10-CM | POA: Insufficient documentation

## 2019-02-03 LAB — CBG MONITORING, ED: Glucose-Capillary: 92 mg/dL (ref 70–99)

## 2019-02-03 LAB — POCT I-STAT EG7
ACID-BASE EXCESS: 1 mmol/L (ref 0.0–2.0)
Bicarbonate: 27.1 mmol/L (ref 20.0–28.0)
Calcium, Ion: 1.27 mmol/L (ref 1.15–1.40)
HEMATOCRIT: 39 % (ref 36.0–46.0)
HEMOGLOBIN: 13.3 g/dL (ref 12.0–15.0)
O2 Saturation: 85 %
Potassium: 3.8 mmol/L (ref 3.5–5.1)
Sodium: 140 mmol/L (ref 135–145)
TCO2: 29 mmol/L (ref 22–32)
pCO2, Ven: 48.4 mmHg (ref 44.0–60.0)
pH, Ven: 7.356 (ref 7.250–7.430)
pO2, Ven: 54 mmHg — ABNORMAL HIGH (ref 32.0–45.0)

## 2019-02-03 LAB — BASIC METABOLIC PANEL WITH GFR
BUN/Creatinine Ratio: 13 (ref 9–23)
BUN: 12 mg/dL (ref 6–24)
CO2: 21 mmol/L (ref 20–29)
Calcium: 9.2 mg/dL (ref 8.7–10.2)
Chloride: 100 mmol/L (ref 96–106)
Creatinine, Ser: 0.95 mg/dL (ref 0.57–1.00)
GFR calc Af Amer: 79 mL/min/1.73
GFR calc non Af Amer: 68 mL/min/1.73
Glucose: 66 mg/dL (ref 65–99)
Potassium: 6.9 mmol/L (ref 3.5–5.2)
Sodium: 141 mmol/L (ref 134–144)

## 2019-02-03 LAB — I-STAT CREATININE, ED: Creatinine, Ser: 1 mg/dL (ref 0.44–1.00)

## 2019-02-03 NOTE — Discharge Instructions (Addendum)
Your potassium was repeated here and is normal at 3.8 Please follow up with your doctor next week.

## 2019-02-03 NOTE — ED Provider Notes (Signed)
[facility] EMERGENCY DEPARTMENT Provider Note   CSN: [id number] Arrival date & time: 02/03/19  1937     History   Chief Complaint Chief Complaint  Patient presents with  . Abnormal Lab    Madison Phillips is a 53 y.o. female.  Madison  53 year old female history of oxygen dependent COPD who presents today stating that she was told that her potassium level was elevated.  She was called by her primary care doctor, Dr. Chapman Fitch and told that her potassium was elevated she should go to the ED for evaluation.  Patient reports no history of kidney problems.  She states she is on multiple medications and has had some recent changes.  She has not had any decrease in urinary output, vomiting, or diarrhea.  She has had some right low back pain for several days.  She reports no previous history of same.  She was seen for exam on February 11.  Past Medical History:  Diagnosis Date  . COPD (chronic obstructive pulmonary disease) ([place])   . GERD (gastroesophageal reflux disease) 12/25/2018  . 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 Left ear hearing loss 12/05/2018    Patient Active Problem List   Diagnosis Date Noted  . Chronic respiratory failure with hypoxia ([place]) 01/23/2019  . Hypertension 12/25/2018  . GERD (gastroesophageal reflux disease) 12/25/2018  . Left ear hearing loss 12/05/2018  . 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 Varicose veins of left lower extremity 12/05/2018  . COPD with emphysema ([place]) 12/05/2018    Past Surgical History:  Procedure Laterality Date  . cyber knife    . ectopic pregnanacy       OB History   No obstetric history on file.      Home Medications    Prior to Admission medications   Medication Sig Start Date End Date Taking? Authorizing Provider  albuterol (PROVENTIL HFA;VENTOLIN HFA) 108 (90 Base) MCG/ACT inhaler Inhale 1-2 puffs into the lungs every 6 (six) hours as needed for wheezing or shortness of breath. 12/07/18    Geradine Girt, DO  amLODipine (NORVASC) 10 MG tablet Take 1 tablet (10 mg total) by mouth daily. 12/25/18 12/25/19  Elsie Stain, MD  ipratropium-albuterol (DUONEB) 0.5-2.5 (3) MG/3ML SOLN Use 4 times daily on schedule, may take two additional treatments as needed 12/25/18   Elsie Stain, MD  mometasone-formoterol [facility]) 200-5 MCG/ACT AERO Inhale 2 puffs into the lungs 2 (two) times daily. 01/02/19   Elsie Stain, MD  pantoprazole (PROTONIX) 40 MG tablet Take 1 tablet (40 mg total) by mouth daily before breakfast. 12/25/18   Elsie Stain, MD    Family History Family History  Problem Relation Age of Onset  . Hypothyroidism Mother   . Diabetes Mellitus II Maternal Aunt   . Diabetes Maternal Aunt   . Cancer Father     Social History Social History   Tobacco Use  . Smoking status: Former Research scientist (life sciences)  . Smokeless tobacco: Never Used  Substance Use Topics  . Alcohol use: Yes    Alcohol/week: 2.0 - 3.0 standard drinks    Types: 2 - 3 Cans of beer per week    Comment: a night  . Drug use: Not Currently     Allergies   Patient has no known allergies.   Review of Systems Review of Systems  All other systems reviewed and are negative.    Physical Exam Updated Vital Signs BP (!) 160/102   Pulse 88  Resp 20   Ht 1.778 m (5\' 10" )   Wt 79.4 kg   SpO2 93%   BMI 25.11 kg/m   Physical Exam Vitals signs and nursing note reviewed.  Constitutional:      Appearance: Normal appearance.  HENT:     Head: Normocephalic.     Right Ear: External ear normal.     Left Ear: External ear normal.     Nose: Nose normal.     Mouth/Throat:     Mouth: Mucous membranes are moist.  Eyes:     Pupils: Pupils are equal, round, and reactive to light.  Neck:     Musculoskeletal: Normal range of motion.  Cardiovascular:     Rate and Rhythm: Normal rate and regular rhythm.     Pulses: Normal pulses.  Pulmonary:     Effort: Pulmonary effort is normal.     Breath sounds: Normal  breath sounds.  Abdominal:     General: Abdomen is flat. Bowel sounds are normal.     Palpations: Abdomen is soft.  Musculoskeletal: Normal range of motion.  Skin:    General: Skin is warm.     Capillary Refill: Capillary refill takes less than 2 seconds.  Neurological:     General: No focal deficit present.     Mental Status: She is alert and oriented to person, place, and time. Mental status is at baseline.  Psychiatric:        Mood and Affect: Mood normal.        Behavior: Behavior normal.      ED Treatments / Results  Labs (all labs ordered are listed, but only abnormal results are displayed) Labs Reviewed  POCT I-STAT EG7 - Abnormal; Notable for the following components:      Result Value   pO2, Ven 54.0 (*)    All other components within normal limits  I-STAT CREATININE, ED  CBG MONITORING, ED    EKG EKG Interpretation  Date/Time:  Saturday February 03 2019 19:45:58 EST Ventricular Rate:  86 PR Interval:    QRS Duration: 86 QT Interval:  374 QTC Calculation: 448 R Axis:   78 Text Interpretation:  Sinus rhythm Consider left atrial enlargement Baseline wander in lead(s) V3 Confirmed by Pattricia Boss [phone]) on 02/03/2019 8:28:18 PM   Radiology No results found.  Procedures Procedures (including critical care time)  Medications Ordered in ED Medications - No data to display   Initial Impression / Assessment and Plan / ED Course  I have reviewed the triage vital signs and the nursing notes.  Pertinent labs & imaging results that were available during my care of the patient were reviewed by me and considered in my medical decision making (see chart for details).    Patient presents with reports that she was hyperkalemic.  Labs obtained yesterday showed a potassium of 6.9.  EKG here was stable.  Labs were redrawn and patient's repeat potassium here is 3.8.  This is consistent with her clinical picture and as she has no risk factors for hyperkalemia with  normal renal function, she is discharged home instructed follow-up with primary care physician.   Final Clinical Impressions(s) / ED Diagnoses   Final diagnoses:  Abnormal labor    ED Discharge Orders    None       Pattricia Boss, MD 02/03/19 2030

## 2019-02-03 NOTE — Telephone Encounter (Signed)
Patient was contacted due to abnormal lab result of potassium level of 6.9 on recent BMP. Patient was asked to go to the ED or [facility] urgent care for repeat of potassium level and treatment if needed. Patient agreed to seek follow-up tonight

## 2019-02-03 NOTE — ED Triage Notes (Signed)
Pt sent by PCP for potassium >6. Denies dialysis/kidney disease. C/o pain to right lower back. Denies urinary changes.

## 2019-02-08 ENCOUNTER — Telehealth: Payer: Self-pay | Admitting: Family Medicine

## 2019-02-12 ENCOUNTER — Telehealth (HOSPITAL_COMMUNITY): Payer: Self-pay

## 2019-02-12 NOTE — Telephone Encounter (Signed)
Returned patients phone call that she had made to [place]. Left name and number for her to call back.

## 2019-02-16 DIAGNOSIS — J449 Chronic obstructive pulmonary disease, unspecified: Secondary | ICD-10-CM | POA: Diagnosis not present

## 2019-02-18 DIAGNOSIS — J449 Chronic obstructive pulmonary disease, unspecified: Secondary | ICD-10-CM | POA: Diagnosis not present

## 2019-02-27 ENCOUNTER — Other Ambulatory Visit (HOSPITAL_COMMUNITY): Payer: Self-pay | Admitting: *Deleted

## 2019-02-27 DIAGNOSIS — Z1231 Encounter for screening mammogram for malignant neoplasm of breast: Secondary | ICD-10-CM

## 2019-03-02 MED FILL — IPRAT-ALBUT 0.5-3(2.5) MG/3: 0.5-2.5 (3) | 15 days supply | Qty: 180 | Fill #2

## 2019-03-09 DIAGNOSIS — N950 Postmenopausal bleeding: Secondary | ICD-10-CM | POA: Insufficient documentation

## 2019-03-21 DIAGNOSIS — J449 Chronic obstructive pulmonary disease, unspecified: Secondary | ICD-10-CM | POA: Diagnosis not present

## 2019-03-25 NOTE — Progress Notes (Signed)
Virtual Visit via Telephone Note  I connected with [name] on 03/25/19 at  9:00 AM EDT by telephone and verified that I am speaking with the correct person using two identifiers.   I discussed the limitations, risks, security and privacy concerns of performing an evaluation and management service by telephone and the availability of in person appointments. I also discussed with the patient that there may be a patient responsible charge related to this service. The patient expressed understanding and agreed to proceed.   History of Present Illness: I confirmed that I was on the phone with [name] and no other participants to the call 53 y.o.F with COPD O2 dependent.   Since the last visit in February 2020 the patient notes a 2-week episode of increased lightheadedness upon arising in the morning.  She also notes increased thick secretions that are difficult to raise.  She had facial twitching with Dulera and therefore stopped the [facility].  The patient has been using DuoNeb 4 times a day and sometimes adding 2 additional treatments.  She is using the flutter valve as well.  She is maintaining oxygen continuously.  The patient is still awaiting her Medicaid status.  Positive In BOLD Constitutional:   No  weight loss, night sweats,  Fevers, chills, fatigue, lassitude. HEENT:   No headaches,  Difficulty swallowing,  Tooth/dental problems,  Sore throat,                No sneezing, itching, ear ache, nasal congestion, post nasal drip,   CV:  No chest pain,  Orthopnea, PND, swelling in lower extremities, anasarca, dizziness, palpitations  GI  No heartburn, indigestion, abdominal pain, nausea, vomiting, diarrhea, change in bowel habits, loss of appetite,    Resp:  shortness of breath with exertion or at rest.  No excess mucus, no productive cough,   non-productive cough,  No coughing up of blood.  No change in color of mucus.  No wheezing.  No chest wall deformity  Skin: no rash or  lesions.  GU: no dysuria, change in color of urine, no urgency or frequency.  No flank pain.  MS:  No joint pain or swelling.  No decreased range of motion.  No back pain.  Psych:  No change in mood or affect. No depression or anxiety.  No memory loss.  Observations/Objective: This was a telephone visit therefore no observations were made  Assessment and Plan: #1 centrilobular emphysema with chronic hypoxic respiratory failure now with side effects using HFA combination long-acting beta agonist and steroid device.  Also overuse with ipratropium may be leading to mucous plugging   Plan will be to discontinue Dulera and begin to use DuoNeb 4 times daily by nebulizer and augment this with 2 additional treatments during the day using albuterol and the nebulizers.  Refills on both DuoNeb and albuterol will be given to the patient  #2 hypertension this patient had elevated blood pressures earlier in the year but more recent values have been within normal range and the patient is no longer using amlodipine.  Based on the patient's last primary care visit it was not felt blood pressure need to be treated.  Therefore I have acknowledged to the patient she will stop the amlodipine.  #3 reflux disease this patient states she is not having active reflux symptoms has not been on Protonix for 3 months  I acknowledge the patient is off Protonix and will take this off the patient's medication profile.  We will endeavor  to have this patient follow-up in 2 months in the office   Follow Up Instructions: The patient understands the change in the nebulizer treatment plan and the fact that we hope to obtain pulmonary rehab once her Medicaid and disability comes through.  In the meantime the patient also will continue her oxygen therapy and flutter valve. The patient will hold off on amlodipine at this time   I discussed the assessment and treatment plan with the patient. The patient was provided an opportunity  to ask questions and all were answered. The patient agreed with the plan and demonstrated an understanding of the instructions.   The patient was advised to call back or seek an in-person evaluation if the symptoms worsen or if the condition fails to improve as anticipated.  I provided 25 minutes of non-face-to-face time during this encounter.   Asencion Noble, MD

## 2019-03-26 ENCOUNTER — Other Ambulatory Visit: Payer: Self-pay

## 2019-03-26 ENCOUNTER — Encounter: Payer: Self-pay | Admitting: Critical Care Medicine

## 2019-03-26 ENCOUNTER — Ambulatory Visit: Payer: Medicaid Other | Attending: Critical Care Medicine | Admitting: Critical Care Medicine

## 2019-03-26 DIAGNOSIS — J9611 Chronic respiratory failure with hypoxia: Secondary | ICD-10-CM | POA: Diagnosis not present

## 2019-03-26 DIAGNOSIS — I10 Essential (primary) hypertension: Secondary | ICD-10-CM | POA: Diagnosis not present

## 2019-03-26 DIAGNOSIS — K219 Gastro-esophageal reflux disease without esophagitis: Secondary | ICD-10-CM | POA: Diagnosis not present

## 2019-03-26 DIAGNOSIS — J432 Centrilobular emphysema: Secondary | ICD-10-CM | POA: Diagnosis not present

## 2019-03-26 MED ORDER — ALBUTEROL SULFATE (2.5 MG/3ML) 0.083% IN NEBU: 2.5000 mg | INHALATION_SOLUTION | Freq: Four times a day (QID) | RESPIRATORY_TRACT | 12 refills | Status: DC | PRN

## 2019-03-26 MED ORDER — IPRATROPIUM-ALBUTEROL 0.5-2.5 (3) MG/3ML IN SOLN: RESPIRATORY_TRACT | 6 refills | Status: DC

## 2019-03-26 MED FILL — ALBUTEROL SUL 2.5 MG/3 ML S: (2.5 MG/3ML | 7 days supply | Qty: 75 | Fill #0

## 2019-03-26 MED FILL — IPRAT-ALBUT 0.5-3(2.5) MG/3: 0.5-2.5 (3) | 30 days supply | Qty: 360 | Fill #0

## 2019-03-29 ENCOUNTER — Ambulatory Visit: Payer: Self-pay | Admitting: Family Medicine

## 2019-04-02 ENCOUNTER — Ambulatory Visit: Payer: Self-pay | Admitting: Family Medicine

## 2019-04-12 ENCOUNTER — Telehealth: Payer: Self-pay | Admitting: Family Medicine

## 2019-04-12 DIAGNOSIS — J432 Centrilobular emphysema: Secondary | ICD-10-CM

## 2019-04-12 MED ORDER — FLUTICASONE-UMECLIDIN-VILANT 100-62.5-25 MCG/INH IN AEPB: 1.0000 | INHALATION_SPRAY | Freq: Every day | RESPIRATORY_TRACT | 1 refills | Status: DC

## 2019-04-12 NOTE — Telephone Encounter (Signed)
Spoke with patient and she stated that Disability is requiring her to do another Pulmonary Function test. Informed patient that she recently had one 3 months ago. Per pt she said her disability people know this but they are requesting another one. Patient stated she also would like to speak with provider if this is possible about this situation as well. Staff informed patient to please drop off her disability information to office for provider to also complete this as well. Patient agreed. Staff informed patient message will be sent to provider.

## 2019-04-12 NOTE — Telephone Encounter (Signed)
Patient came in the office and would like to get a referral for another function test. Please follow up.

## 2019-04-12 NOTE — Telephone Encounter (Signed)
I called the patient and she is requesting a dry powdered inhaler and also needs repeat lung function test per disability a prescription for the Trelegy inhaler was written 1 puff a day and she will also receive another lung function test I told her this would likely be delayed until 1 June due to the [employer] pandemic

## 2019-04-13 ENCOUNTER — Telehealth: Payer: Self-pay | Admitting: Family Medicine

## 2019-04-13 NOTE — Telephone Encounter (Signed)
Patient called requesting a prior authorization for her Fluticasone-Umeclidin-Vilant (TRELEGY ELLIPTA) 100-62.5-25 MCG/INH AEPB [[id number]] because she just got her medicaid, please follow up.

## 2019-04-19 ENCOUNTER — Telehealth: Payer: Self-pay

## 2019-04-19 ENCOUNTER — Encounter: Payer: Self-pay | Admitting: Family Medicine

## 2019-04-19 ENCOUNTER — Other Ambulatory Visit: Payer: Self-pay

## 2019-04-19 ENCOUNTER — Ambulatory Visit: Payer: Medicaid Other | Attending: Family Medicine | Admitting: Family Medicine

## 2019-04-19 DIAGNOSIS — R140 Abdominal distension (gaseous): Secondary | ICD-10-CM

## 2019-04-19 DIAGNOSIS — G5132 Clonic hemifacial spasm, left: Secondary | ICD-10-CM | POA: Diagnosis not present

## 2019-04-19 DIAGNOSIS — Z86018 Personal history of other benign neoplasm: Secondary | ICD-10-CM | POA: Diagnosis not present

## 2019-04-19 DIAGNOSIS — J432 Centrilobular emphysema: Secondary | ICD-10-CM | POA: Diagnosis not present

## 2019-04-19 DIAGNOSIS — G5139 Clonic hemifacial spasm, unspecified: Secondary | ICD-10-CM

## 2019-04-19 DIAGNOSIS — J9611 Chronic respiratory failure with hypoxia: Secondary | ICD-10-CM | POA: Diagnosis not present

## 2019-04-19 NOTE — Telephone Encounter (Signed)
I contacted the pt in regards to her 04/23/19. I was able to complete pre charting. Allergies, meds and PMH have been updated. Pt's e-mail address is [email or web address]. E-mail link has been sent.  Pt understands that although there may be some limitations with this type of visit, we will take all precautions to reduce any security or privacy concerns.  Pt understands that this will be treated like an in office visit and we will file with pt's insurance, and there may be a patient responsible charge related to this service.

## 2019-04-19 NOTE — Progress Notes (Signed)
Virtual Visit via Telephone Note  I connected with Madison Phillips on 04/19/19 at  9:50 AM EDT by telephone and verified that I am speaking with the correct person using two identifiers.   I discussed the limitations, risks, security and privacy concerns of performing an evaluation and management service by telephone and the availability of in person appointments. I also discussed with the patient that there may be a patient responsible charge related to this service. The patient expressed understanding and agreed to proceed.  Patient Location: Home Provider Location: Office Others participating in call: Emilio Aspen, CMA   History of Present Illness:      53 yo female with the complaint of return of facial spasm on the left side of her face and she was having similar symptoms after she was diagnosed with history of neuroma in the past.  Patient states when she lived back in [place] she was diagnosed with acoustic neuroma for which she had CyberKnife surgery.  Patient is concerned that she may be having new onset of problems related to the prior acoustic neuroma due to her onset of facial spasms.        Patient also with complaint of continued shortness of breath and low oxygen levels.  Patient reports that she is having oxygen saturations of 88% without oxygen such as when she is getting out of the shower.  She is also not getting above 91% even with wearing oxygen.  She reports that she was recently seen here at the office by the pulmonologist, Dr. Joya Gaskins and he gave her a prescription for a new medication, Trelegy, patient however has never been able to fill this medication as she was recently approved for Medicaid and she needs preauthorization before she can get the Trelegy as she believes that this is part of her ongoing shortness of breath and low oxygen levels.  She is not having any new issues with cough/productive cough and no fever or chills.  Patient also with complaint of abdominal  bloating.  Patient does not feel as if she is having any nausea, vomiting or reflux symptoms and is not having any flatulence.  She denies any diarrhea or constipation but just feels like her abdomen is very full and distended.   Past Medical History:  Diagnosis Date  . COPD (chronic obstructive pulmonary disease) ([place])   . GERD (gastroesophageal reflux disease) 12/25/2018  . 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 Left ear hearing loss 12/05/2018    Past Surgical History:  Procedure Laterality Date  . cyber knife    . ectopic pregnanacy      Family History  Problem Relation Age of Onset  . Hypothyroidism Mother   . Diabetes Mellitus II Maternal Aunt   . Diabetes Maternal Aunt   . Cancer Father     Social History   Tobacco Use  . Smoking status: Former Research scientist (life sciences)  . Smokeless tobacco: Never Used  Substance Use Topics  . Alcohol use: Yes    Alcohol/week: 2.0 - 3.0 standard drinks    Types: 2 - 3 Cans of beer per week    Comment: a night  . Drug use: Not Currently     No Known Allergies   Review of Systems  Constitutional: Positive for malaise/fatigue. Negative for chills and fever.  HENT: Positive for congestion, ear pain and hearing loss (left ear due to prior acoustic neuroma). Negative for sore throat.   Eyes: Negative for blurred vision and double vision.  Respiratory: Positive  for cough (occasional) and shortness of breath. Negative for sputum production.   Cardiovascular: Negative for chest pain and palpitations.  Gastrointestinal: Negative for abdominal pain, constipation, diarrhea, heartburn, nausea and vomiting.       C/o abdominal bloating  Genitourinary: Negative for dysuria and frequency.  Skin: Negative for itching and rash.     Observations/Objective: No vital signs or physical exam conducted as visit was done via telephone  Assessment and Plan: 1. Centrilobular emphysema ([place]) Patient with complaint of continued SOB and inability to obtain the newest  medication, Trelegy that she was prescribed for her COPD. I spoke with the RN and patient's pre-authorization is being worked on by [employer]. Patient is being referred to pulmonology for further evaluation and treatment. She is encouraged to go to the ED this weekend if she has an acute increase in her shortness of breath  - Ambulatory referral to Pulmonology  2. Chronic respiratory failure with hypoxia [facility]) Patient is encouraged to wear her oxygen continuously and use her current respiratory medications and urgent referral is being placed for pulmonology. Pre-auth is being obtained for her Trelegy but unfortunately not sure when/if this medication will be approved - Ambulatory referral to Pulmonology  3. History of acoustic neuroma Patient reports a history of left sided acoustic neuroma for which she had cyber-knife surgery when she lived in [place]. She states that she is now having issues with facial spasms and she wonders if she is having a recurrence of the neuroma. Patient will be scheduled for an MRI but she also has issues with claustrophobia so she will need an open MRI.  - Ambulatory referral to Neurology  4. Hemifacial spasm Patient will be referred to Neurology for further evaluation and treatment - Ambulatory referral to Neurology  5. Abdominal bloating Discussed with patient that she may be having air trapping. She denies current reflux type symptoms or other GI symptoms other than flatulence.  She should follow-up in office if continued bloating/discomfort- urgent or ED if worse this weekend   Follow Up Instructions: patient encouraged to go to the ED if she has worsening symptoms over the weekend and will try to arrange for patient to have a Webex visit with Dr. Joya Gaskins next week    I discussed the assessment and treatment plan with the patient. The patient was provided an opportunity to ask questions and all were answered. The patient agreed with the plan and  demonstrated an understanding of the instructions.   The patient was advised to call back or seek an in-person evaluation if the symptoms worsen or if the condition fails to improve as anticipated.  I provided  22 minutes of non-face-to-face time during this encounter.   [name], MD

## 2019-04-19 NOTE — Telephone Encounter (Signed)
Pa submitted and is pending approval

## 2019-04-19 NOTE — Progress Notes (Signed)
Per pt lately she have been extremely bloated everyday  Per patient she have been having facial spasms again and it's causing her to have headaches and sharp pain going through her left ear. Per patient she needs a CAT scan.  Per pt she is still doing her oxygen and nebulizer as instructed.  Per pt the pharmacy called her about her Trelegy and informed her she needs a Prior Auth with medicaid to get this medication.   Per pt her o2 level lately has been 89-91 Per pt yesterday was 89 Per pt her O2 right now at home is 89  Per pt yesterday she did have a lot of dizziness

## 2019-04-20 ENCOUNTER — Telehealth: Payer: Self-pay | Admitting: *Deleted

## 2019-04-20 DIAGNOSIS — J449 Chronic obstructive pulmonary disease, unspecified: Secondary | ICD-10-CM | POA: Diagnosis not present

## 2019-04-20 NOTE — Telephone Encounter (Signed)
Prior Auth for patient MRI of the Brain w/o Contrast was completed via [employer]. Additional information was needed and staff faxed patient recent visit information to [place]. Fax number [phone]. Staff did get confirmation sheet that fax went through.

## 2019-04-23 ENCOUNTER — Ambulatory Visit (INDEPENDENT_AMBULATORY_CARE_PROVIDER_SITE_OTHER): Payer: Medicaid Other | Admitting: Neurology

## 2019-04-23 ENCOUNTER — Telehealth: Payer: Self-pay

## 2019-04-23 ENCOUNTER — Encounter: Payer: Self-pay | Admitting: Neurology

## 2019-04-23 ENCOUNTER — Telehealth: Payer: Self-pay | Admitting: Neurology

## 2019-04-23 ENCOUNTER — Other Ambulatory Visit: Payer: Self-pay

## 2019-04-23 DIAGNOSIS — G4489 Other headache syndrome: Secondary | ICD-10-CM | POA: Diagnosis not present

## 2019-04-23 DIAGNOSIS — Z86018 Personal history of other benign neoplasm: Secondary | ICD-10-CM

## 2019-04-23 MED ORDER — ALPRAZOLAM 0.5 MG PO TABS: ORAL_TABLET | ORAL | 0 refills | Status: DC

## 2019-04-23 MED ORDER — CARBAMAZEPINE 200 MG PO TABS: ORAL_TABLET | ORAL | 3 refills | Status: DC

## 2019-04-23 NOTE — Addendum Note (Signed)
Addended by: Kathrynn Ducking on: 04/23/2019 04:45 PM   Modules accepted: Orders

## 2019-04-23 NOTE — Telephone Encounter (Signed)
Pt called stating she is needing something called in for her when she gets her MRI. Please advise.

## 2019-04-23 NOTE — Telephone Encounter (Signed)
Medicaid patient is scheduled at GI for 05/11/19. GI will obtain the authorization.

## 2019-04-23 NOTE — Progress Notes (Signed)
Subjective:    Patient ID: Madison Phillips, female    DOB: 21-Jan-1966, 53 y.o.   MRN: [id number] Virtual Visit via Telephone Note  I connected with [name] on 04/24/19 at  8:30 AM EDT by telephone and verified that I am speaking with the correct person using two identifiers.   Consent:  I discussed the limitations, risks, security and privacy concerns of performing an evaluation and management service by telephone and the availability of in person appointments. I also discussed with the patient that there may be a patient responsible charge related to this service. The patient expressed understanding and agreed to proceed.  Location of patient: Home  Location of provider: In my office  Persons participating in the televisit with the patient.  No one else participating in the visit   History of Present Illness:  53 y.o.F with COPD Gold D oxygen dependent This is a telephone visit and I confirm with the patient that she was on the phone by herself and was at home.  The patient notes a productive cough of thin white mucus.  Her dyspnea appears to be worsening.  When she was using the [facility] it was causing spasms and twitching in the face therefore she has stopped this medication.  She is wishing to try the Trelegy and were in the process of accessing this medication.  She is using the flutter valve but only once or twice a day.  She does use the DuoNeb nebulizer 4 times a day.  She is not wearing oxygen consistently.  She is not wearing the oxygen at night.  She is requesting repeat pulmonary function study along with pulmonary rehabilitation order.  She is also awaiting a portable oxygen concentrator.  The patient does confirm that she has now received Medicaid.  Note the patient is not on antihypertensives at this time.  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  Patient also complains of significant abdominal bloating  and gas.  She is requesting something for this.  She denies any heartburn.  Note she had previously been on Protonix but was stopped of this medication.  Please review shortness of breath assessment below  Shortness of Breath  This is a chronic problem. The current episode started more than 1 month ago. The problem occurs constantly. The problem has been gradually worsening. Associated symptoms include orthopnea, PND, sputum production and wheezing. Pertinent negatives include no chest pain, ear pain, fever, headaches, hemoptysis, leg swelling, rash, rhinorrhea, sore throat, swollen glands, syncope or vomiting. The symptoms are aggravated by any activity, animal exposure, lying flat, emotional upset, exercise, fumes, odors, pollens, occupational exposure, URIs, smoke and weather changes (mold in home, just cleaned out , works for [employer].  local office ). Associated symptoms comments: Severe dyspnea with talking  Chest is tight Mucus is clear abd is bloating . Risk factors include smoking. She has tried beta agonist inhalers and oral steroids for the symptoms. The treatment provided moderate relief. Her past medical history is significant for COPD and pneumonia. There is no history of allergies, aspirin allergies, asthma, CAD, a heart failure or PE.    Past Medical History:  Diagnosis Date  . COPD (chronic obstructive pulmonary disease) ([place])   . GERD (gastroesophageal reflux disease) 12/25/2018  . 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 Left ear hearing loss 12/05/2018     Family History  Problem Relation Age of Onset  .  Hypothyroidism Mother   . Diabetes Mellitus II Maternal Aunt   . Diabetes Maternal Aunt   . Cancer Father      Social History   Socioeconomic History  . Marital status: Single    Spouse name: Not on file  . Number of children: Not on file  . Years of education: Not on file  . Highest education level: Not on file  Occupational History  . Not on file  Social Needs  .  Financial resource strain: Not on file  . Food insecurity:    Worry: Not on file    Inability: Not on file  . Transportation needs:    Medical: Not on file    Non-medical: Not on file  Tobacco Use  . Smoking status: Former Research scientist (life sciences)  . Smokeless tobacco: Never Used  Substance and Sexual Activity  . Alcohol use: Yes    Alcohol/week: 2.0 - 3.0 standard drinks    Types: 2 - 3 Cans of beer per week    Comment: a night  . Drug use: Not Currently  . Sexual activity: Yes  Lifestyle  . Physical activity:    Days per week: Not on file    Minutes per session: Not on file  . Stress: Not on file  Relationships  . Social connections:    Talks on phone: Not on file    Gets together: Not on file    Attends religious service: Not on file    Active member of club or organization: Not on file    Attends meetings of clubs or organizations: Not on file    Relationship status: Not on file  . Intimate partner violence:    Fear of current or ex partner: Not on file    Emotionally abused: Not on file    Physically abused: Not on file    Forced sexual activity: Not on file  Other Topics Concern  . Not on file  Social History Narrative  . Not on file     No Known Allergies   Outpatient Medications Prior to Visit  Medication Sig Dispense Refill  . albuterol (PROVENTIL HFA;VENTOLIN HFA) 108 (90 Base) MCG/ACT inhaler Inhale 1-2 puffs into the lungs every 6 (six) hours as needed for wheezing or shortness of breath. 1 Inhaler 0  . albuterol (PROVENTIL) (2.5 MG/3ML) 0.083% nebulizer solution Take 3 mLs (2.5 mg total) by nebulization every 6 (six) hours as needed for wheezing or shortness of breath. 75 mL 12  . ALPRAZolam (XANAX) 0.5 MG tablet Take 2 tablets approximately 45 minutes prior to the MRI study, take a third tablet if needed. 3 tablet 0  . carbamazepine (TEGRETOL) 200 MG tablet 1/2 tablet twice daily for 2 weeks and then take 1 full tablet twice daily 60 tablet 3  .  Fluticasone-Umeclidin-Vilant (TRELEGY ELLIPTA) 100-62.5-25 MCG/INH AEPB Inhale 1 puff into the lungs daily. (Patient not taking: Reported on 04/19/2019) 60 each 1  . ipratropium-albuterol (DUONEB) 0.5-2.5 (3) MG/3ML SOLN Use 4 times daily on schedule 360 mL 6   No facility-administered medications prior to visit.      Review of Systems  Constitutional: Positive for fatigue. Negative for fever.  HENT: Positive for sneezing. Negative for ear pain, mouth sores, nosebleeds, postnasal drip, rhinorrhea, sinus pressure, sinus pain, sore throat and trouble swallowing.   Eyes: Negative for pain, discharge, redness and itching.  Respiratory: Positive for cough, sputum production, chest tightness, shortness of breath and wheezing. Negative for hemoptysis.   Cardiovascular: Positive for palpitations,  orthopnea and PND. Negative for chest pain, leg swelling and syncope.  Gastrointestinal: Positive for diarrhea. Negative for blood in stool and vomiting.       Burp and gassy  Genitourinary:       Incontinence with cough  Skin: Negative for rash.  Neurological: Negative for headaches.  Hematological: Does not bruise/bleed easily.  Psychiatric/Behavioral: Positive for sleep disturbance. Negative for self-injury and suicidal ideas. The patient is nervous/anxious.             Observations/Objective:  No physical exam was performed as this was a telephone visit  BMP Latest Ref Rng & Units 02/03/2019 02/02/2019 12/24/2018  Glucose 65 - 99 mg/dL - 66 86  BUN 6 - 24 mg/dL - 12 11  Creatinine 0.44 - 1.00 mg/dL 1.00 0.95 0.86  BUN/Creat Ratio 9 - 23 - 13 -  Sodium 135 - 145 mmol/L 140 141 139  Potassium 3.5 - 5.1 mmol/L 3.8 6.9(HH) 3.4(L)  Chloride 96 - 106 mmol/L - 100 104  CO2 20 - 29 mmol/L - 21 26  Calcium 8.7 - 10.2 mg/dL - 9.2 9.1   Lab Results  Component Value Date   WBC 8.0 12/24/2018   HGB 13.3 02/03/2019   HCT 39.0 02/03/2019   MCV 95.6 12/24/2018   PLT 176 12/24/2018   EKG: LVH pattern   1/16 Study Conclusions  - Left ventricle: The cavity size was normal. Systolic function was   normal. The estimated ejection fraction was in the range of 60%   to 65%. Wall motion was normal; there were no regional wall   motion abnormalities. Left ventricular diastolic function   parameters were normal. - Aortic valve: Transvalvular velocity was within the normal range.   There was no stenosis. There was no regurgitation. - Mitral valve: Transvalvular velocity was within the normal range.   There was no evidence for stenosis. There was trivial   regurgitation. - Left atrium: The atrium was mildly dilated. - Right ventricle: The cavity size was normal. Wall thickness was   normal. Systolic function was normal. - Atrial septum: No defect or patent foramen ovale was identified. - Tricuspid valve: There was mild regurgitation. - Pulmonary arteries: Systolic pressure was within the normal   range. PA peak pressure: 25 mm Hg (S).  Alpha-1 antitrypsin assay was normal  PFTS 01/10/19 COPD Stage 4 Gold   FeV1 27% predicted  RV 168% pred   TLC 88% predicted  Assessment and Plan: #1 Gold stage IV COPD based on previous pulmonary function testing in January now the patient needs repeat testing per Medicaid for her disability also the patient's not been compliant with Dulera secondary to adverse side effect of facial twitching which is being evaluated by neurology.  The patient has a prescription for Tegretol which she has not yet started from neurology.  She has an MRI that is pending.  Note the patient's alpha-1 antitrypsin study was normal  Plan will be for the patient to obtain trelegy 1 puff daily and maintain DuoNeb 4 times daily for now.  The patient will use her flutter valve more regularly after each nebulizer treatment.  We will also reorder the pulmonary function studies and order pulmonary rehabilitation out the patient has Medicaid  #2 hypoxic respiratory failure on a chronic basis:  Note the patient has not been compliant with her oxygen therapy.  Plan will be for the patient to use her oxygen on a more regular basis, we will also follow-up with case management to  obtain the patient a portable oxygen concentrator  #3 acoustic neuroma: The patient will obtain an MRI and follow-up per neurology  #4 abdominal bloating with gas: I suspect the patient is swallowing air and will prescribe Mylicon 277 mg 4 times daily as needed for gaseous abdominal bloating  Follow Up Instructions: The patient knows will be a follow-up visit middle of June in the office with me and also that the pulmonary function study and pulmonary rehabilitation order has been placed   I discussed the assessment and treatment plan with the patient. The patient was provided an opportunity to ask questions and all were answered. The patient agreed with the plan and demonstrated an understanding of the instructions.   The patient was advised to call back or seek an in-person evaluation if the symptoms worsen or if the condition fails to improve as anticipated.  I provided 25 minutes of non-face-to-face time during this encounter  including  median intraservice time , review of notes, labs, imaging, medications  and explaining diagnosis and management to the patient .    Asencion Noble, MD

## 2019-04-23 NOTE — Telephone Encounter (Signed)
CMN for O2 concentrator and portable O2 system faxed to [facility]

## 2019-04-23 NOTE — Progress Notes (Signed)
Virtual Visit via Video Note  I connected with [name] on 04/23/19 at  9:00 AM EDT by a video enabled telemedicine application and verified that I am speaking with the correct person using two identifiers.  Location: Patient: The patient is at home. Provider: Physician in office.   I discussed the limitations of evaluation and management by telemedicine and the availability of in person appointments. The patient expressed understanding and agreed to proceed.  History of Present Illness: Madison Phillips is a 53 year old right-handed black female with a history of an acoustic neuroma that was discovered in 2011.  At that time, the patient lived in [place], she was noting some hearing deficits on the left and went to have this evaluated.  At that time, the acoustic neuroma was discovered, she underwent a gamma knife procedure.  Within 6 months after the gamma knife procedure, she began having episodes of hemifacial spasm on the left and was subsequently treated with gabapentin which seemed to help.  She went about 9 years without any problems with hemifacial spasm but this returned in mid December 2019.  The patient underwent a CT scan of the brain without contrast at that time and this was unremarkable.  The patient has also had episodes of severe shooting pain in the left ear and she has had left-sided headaches that may occur 2 or 3 times a week.  The left-sided headache may last several hours.  She is having hemifacial spasm initially occurring 3-4 times a day but now is occurring only once or twice a day.  The entire left face is involved, this is unrelated to the sharp ear pain or to the left-sided headache.  She will take Motrin for the headache.  She reports no numbness or weakness of the arms or legs or face.  She does have episodic dizzy spells.  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  She denies issues controlling the bowels of the bladder with exception that there is some stress  incontinence of the bladder at times.  She reports no significant balance issues.  There is no issue with neck pain.  She does have a history of COPD.  She did take her sister's gabapentin, she believes that this worsened her headache and did not help the hemifacial spasm.   Observations/Objective: The video evaluation reveals that the patient has good facial symmetry.  She has good full extraocular movements.  She has good symmetric protrusion of the tongue with good lateral movement of the tongue.  Speech is well enunciated, not aphasic or dysarthric.  She has good finger-nose-finger and heel shin bilaterally.  Gait is unremarkable, tandem gait is normal.  Romberg is negative.  Range of movement the neck is full.  Assessment and Plan: 1.  History of left acoustic neuroma, status post gamma knife procedure  2.  Left hemifacial spasm  3.  Left-sided headache  4.  Lancinating pain in the left ear  The patient has been set up for MRI of the brain, but this is only without contrast, I will reorder the study with contrast.  The patient will be placed on carbamazepine working up to 200 mg twice daily.  She will contact me if the medication is not tolerated or not effective.  She will follow-up in 3 months.  Follow Up Instructions: 38-month follow-up with me.   I discussed the assessment and treatment plan with the patient. The patient was provided an opportunity to ask questions and all  were answered. The patient agreed with the plan and demonstrated an understanding of the instructions.   The patient was advised to call back or seek an in-person evaluation if the symptoms worsen or if the condition fails to improve as anticipated.  I provided 30 minutes of non-face-to-face time during this encounter.   Kathrynn Ducking, MD

## 2019-04-23 NOTE — Telephone Encounter (Signed)
The prescription was sent into [facility].

## 2019-04-23 NOTE — Telephone Encounter (Signed)
I called the patient.  I will call in a small prescription for alprazolam for the MRI.

## 2019-04-23 NOTE — Telephone Encounter (Signed)
Pharmacy called in and stated they are unable to fill pts controlled med she will need to select a new pharmacy

## 2019-04-24 ENCOUNTER — Encounter: Payer: Self-pay | Admitting: Critical Care Medicine

## 2019-04-24 ENCOUNTER — Ambulatory Visit: Payer: Medicaid Other | Attending: Critical Care Medicine | Admitting: Critical Care Medicine

## 2019-04-24 ENCOUNTER — Telehealth: Payer: Self-pay

## 2019-04-24 ENCOUNTER — Telehealth (HOSPITAL_COMMUNITY): Payer: Self-pay | Admitting: *Deleted

## 2019-04-24 DIAGNOSIS — K219 Gastro-esophageal reflux disease without esophagitis: Secondary | ICD-10-CM

## 2019-04-24 DIAGNOSIS — J432 Centrilobular emphysema: Secondary | ICD-10-CM

## 2019-04-24 DIAGNOSIS — J9611 Chronic respiratory failure with hypoxia: Secondary | ICD-10-CM

## 2019-04-24 MED ORDER — SIMETHICONE 125 MG PO CHEW: 125.0000 mg | CHEWABLE_TABLET | Freq: Four times a day (QID) | ORAL | 2 refills | Status: DC | PRN

## 2019-04-24 MED FILL — TEGretol 200 MG TABS: 200 | 28 days supply | Qty: 42 | Fill #0

## 2019-04-24 NOTE — Telephone Encounter (Signed)
Patient had televisit this morning. Is inquiring on status of Trelegy PA.

## 2019-04-24 NOTE — Progress Notes (Signed)
Called patient to initiate their telephone visit with provider Dr. Asencion Noble. Verified date of birth. Patient states that she is still [facility] & has a productive cough. Has been w/o her Trelegy x 2 weeks. Wants to follow up on status of PA. Laurance Flatten, CMA.

## 2019-04-24 NOTE — Telephone Encounter (Signed)
Called and spoke to pt regarding pulmonary rehab referral we received from her pulmonologist Dr. Joya Gaskins.  Advised pt that unfortunately Medicaid does not cover Pulmonary rehab.  However pt is eligible for Pulmonary Maintenance which is self pay.  Pt felt financially she could cover the cost and would supply her own oxygen to use.  Pt is very interested and excited to "get moving".  Advised pt that Pulmonary Rehab continues closure due to adherence of national recommendation for Covid -19 in group setting.  Pt verbalized understanding. Will send pt education materials along with exercises she can do with elastic band during the interim. Pt appreciative of the call. Madison Phillips, BSN Cardiac and Training and development officer

## 2019-04-24 NOTE — Telephone Encounter (Signed)
I received a fax from [facility] stating the pt's Carbamazepine 200 mg tabs are not covered and would need a PA.  I contacted [facility] and spoke with Ita ( ref call # [id number]). She states the brand name tegretol is the preferred drug and no PA would be needed. I [facility]) and advised via vm to run the rx as the brand name tegretol. Pharm advised to return my call if they had any further questions.

## 2019-04-27 ENCOUNTER — Telehealth: Payer: Self-pay | Admitting: Family Medicine

## 2019-04-27 NOTE — Telephone Encounter (Signed)
I am checking with Opal Sidles.  I am not sure medicaid will pay for a POC

## 2019-04-27 NOTE — Telephone Encounter (Signed)
Patient called asking for a travel concentrator

## 2019-04-30 ENCOUNTER — Telehealth: Payer: Self-pay

## 2019-04-30 NOTE — Telephone Encounter (Signed)
Call placed to [facility], spoke to [place] who confirmed receipt of the CMN for POC

## 2019-04-30 NOTE — Telephone Encounter (Signed)
Noted thank you

## 2019-05-02 ENCOUNTER — Telehealth: Payer: Self-pay

## 2019-05-02 NOTE — Telephone Encounter (Signed)
Call placed to [facility].  Spoke to [place] who stated that the patient is on the wait list for the POC.  They are not sure when they will be delivered and they have been in touch with the patient.

## 2019-05-08 ENCOUNTER — Telehealth: Payer: Self-pay | Admitting: Family Medicine

## 2019-05-08 NOTE — Telephone Encounter (Signed)
Patient called stating she would like to get an update on her lung evaluation appointment. Please follow up.

## 2019-05-08 NOTE — Telephone Encounter (Signed)
Ordered  PFT on 5/5.   My understanding is this is on hold d/t COVID.     Madison Phillips, can you determine when ms owen can get her pft  I have told her several times covid is the hold up and she does not remember

## 2019-05-08 NOTE — Telephone Encounter (Signed)
Per pt she was told that she had to go to the hospital for a lung evaluate and would like to know what's going on with it.

## 2019-05-08 NOTE — Telephone Encounter (Signed)
Called patient and informed her with what provider stated and patient verbalized understanding. Patient states she will call back in 2 wks to f/u on the status of the PFT.

## 2019-05-09 ENCOUNTER — Telehealth: Payer: Self-pay

## 2019-05-09 NOTE — Telephone Encounter (Signed)
As per Maria Ramirez Perez, [facility] [place] [facility], the patient's referral is now closed.  

## 2019-05-11 ENCOUNTER — Other Ambulatory Visit: Payer: Medicaid Other

## 2019-05-11 ENCOUNTER — Ambulatory Visit
Admission: RE | Admit: 2019-05-11 | Discharge: 2019-05-11 | Disposition: A | Discharge status: Home / Self Care | Payer: Medicaid Other | Source: Ambulatory Visit | Attending: Neurology | Admitting: Neurology

## 2019-05-11 ENCOUNTER — Other Ambulatory Visit: Payer: Self-pay

## 2019-05-11 DIAGNOSIS — Z86018 Personal history of other benign neoplasm: Secondary | ICD-10-CM

## 2019-05-11 DIAGNOSIS — G4489 Other headache syndrome: Secondary | ICD-10-CM | POA: Diagnosis not present

## 2019-05-11 MED ORDER — GADOBENATE DIMEGLUMINE 529 MG/ML IV SOLN
17.0000 mL | Freq: Once | INTRAVENOUS | Status: AC | PRN
  Administered 2019-05-11: 17 mL via INTRAVENOUS

## 2019-05-15 ENCOUNTER — Telehealth: Payer: Self-pay | Admitting: Neurology

## 2019-05-15 MED ORDER — GABAPENTIN 100 MG PO CAPS: 100.0000 mg | ORAL_CAPSULE | Freq: Three times a day (TID) | ORAL | 3 refills | Status: DC

## 2019-05-15 MED FILL — GABAPENTIN 100 MG CAP: 100 | 30 days supply | Qty: 90 | Fill #0

## 2019-05-15 NOTE — Telephone Encounter (Signed)
I called the patient.  The patient never took the carbamazepine, she wants to retry the gabapentin and low-dose, 100 mg 3 times daily.  She believes that the hemifacial spasm is improving.  She believes that she has a CD of her old MRI, hopefully she can find this and bring to the office so we can compare to this study.   MRI brain 05/13/19:  IMPRESSION: This MRI of the brain with and without contrast shows the following: 1.  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 2.   There is a normal enhancement pattern elsewhere in the brain. 3.   Mild chronic microvascular ischemic changes.  There were no acute findings.

## 2019-05-17 ENCOUNTER — Telehealth (HOSPITAL_COMMUNITY): Payer: Self-pay | Admitting: *Deleted

## 2019-05-18 ENCOUNTER — Telehealth (HOSPITAL_COMMUNITY): Payer: Self-pay

## 2019-05-18 NOTE — Telephone Encounter (Signed)
Closing referral pt is not interested in virtual program. Pt feels she can join a gym for what pul rehab would cost her. Closed referral. Tedra Senegal. Support Rep II

## 2019-05-21 ENCOUNTER — Other Ambulatory Visit: Payer: Self-pay | Admitting: Neurology

## 2019-05-21 ENCOUNTER — Telehealth: Payer: Self-pay | Admitting: Neurology

## 2019-05-21 DIAGNOSIS — J449 Chronic obstructive pulmonary disease, unspecified: Secondary | ICD-10-CM | POA: Diagnosis not present

## 2019-05-21 MED ORDER — CARBAMAZEPINE 200 MG PO TABS: ORAL_TABLET | ORAL | 3 refills | Status: DC

## 2019-05-21 MED ORDER — IBUPROFEN 800 MG PO TABS: 800.0000 mg | ORAL_TABLET | Freq: Three times a day (TID) | ORAL | 1 refills | Status: DC | PRN

## 2019-05-21 MED FILL — IBUPROFEN 800 MG TABLET: 800 | 10 days supply | Qty: 30 | Fill #0

## 2019-05-21 NOTE — Telephone Encounter (Signed)
Pt called in stating she has completely stopped taking Gabapentin due to not sleeping and having the cotton mouth , she states she is having a really bad pain in the left side of brain all the way to her eye, she states she cant sleep at night. Pt is very emotional as she is speaking with me, she states she needs something for the pain , she stated the 500mg  Tylenol isnt working at all.

## 2019-05-21 NOTE — Addendum Note (Signed)
Addended by: Kathrynn Ducking on: 05/21/2019 01:45 PM   Modules accepted: Orders

## 2019-05-21 NOTE — Telephone Encounter (Signed)
The patient no longer is having sharp lancinating pains in the left ear but she is having sharp jabbing pains behind the left eye and front of the head.  The patient could not tolerate gabapentin even at low-dose secondary to drowsiness and dryness of the mouth.  The patient never went on carbamazepine that was prescribed for her originally, she will start taking 1/2 tablet twice daily for this pain.  The patient wishes to have a prescription for Motrin for her pain.  I will send this in as well.

## 2019-05-22 MED FILL — TEGretol 200 MG TABS: 200 | 28 days supply | Qty: 42 | Fill #1

## 2019-05-22 NOTE — Telephone Encounter (Signed)
Medicaid Josem Kaufmann: [id number] (exp. 05/04/19 to 10/31/19)

## 2019-05-23 ENCOUNTER — Inpatient Hospital Stay (HOSPITAL_COMMUNITY)
Admission: EM | Admit: 2019-05-23 | Discharge: 2019-05-28 | DRG: 177 | Disposition: A | Discharge status: Home / Self Care | Payer: Medicaid Other | Attending: Internal Medicine | Admitting: Internal Medicine

## 2019-05-23 ENCOUNTER — Other Ambulatory Visit: Payer: Self-pay

## 2019-05-23 ENCOUNTER — Encounter (HOSPITAL_COMMUNITY): Payer: Self-pay | Admitting: Emergency Medicine

## 2019-05-23 ENCOUNTER — Telehealth: Payer: Self-pay | Admitting: Family Medicine

## 2019-05-23 ENCOUNTER — Emergency Department (HOSPITAL_COMMUNITY): Payer: Medicaid Other

## 2019-05-23 DIAGNOSIS — J439 Emphysema, unspecified: Secondary | ICD-10-CM | POA: Diagnosis present

## 2019-05-23 DIAGNOSIS — Z79899 Other long term (current) drug therapy: Secondary | ICD-10-CM

## 2019-05-23 DIAGNOSIS — J1289 Other viral pneumonia: Secondary | ICD-10-CM | POA: Diagnosis present

## 2019-05-23 DIAGNOSIS — J189 Pneumonia, unspecified organism: Secondary | ICD-10-CM

## 2019-05-23 DIAGNOSIS — Z86018 Personal history of other benign neoplasm: Secondary | ICD-10-CM

## 2019-05-23 DIAGNOSIS — Z791 Long term (current) use of non-steroidal anti-inflammatories (NSAID): Secondary | ICD-10-CM

## 2019-05-23 DIAGNOSIS — R0789 Other chest pain: Secondary | ICD-10-CM | POA: Diagnosis not present

## 2019-05-23 DIAGNOSIS — R918 Other nonspecific abnormal finding of lung field: Secondary | ICD-10-CM | POA: Diagnosis not present

## 2019-05-23 DIAGNOSIS — H9192 Unspecified hearing loss, left ear: Secondary | ICD-10-CM | POA: Diagnosis present

## 2019-05-23 DIAGNOSIS — Z87891 Personal history of nicotine dependence: Secondary | ICD-10-CM

## 2019-05-23 DIAGNOSIS — J4489 Other specified chronic obstructive pulmonary disease: Secondary | ICD-10-CM | POA: Diagnosis present

## 2019-05-23 DIAGNOSIS — Z833 Family history of diabetes mellitus: Secondary | ICD-10-CM

## 2019-05-23 DIAGNOSIS — U071 COVID-19: Principal | ICD-10-CM | POA: Diagnosis present

## 2019-05-23 DIAGNOSIS — J9601 Acute respiratory failure with hypoxia: Secondary | ICD-10-CM | POA: Diagnosis present

## 2019-05-23 DIAGNOSIS — E860 Dehydration: Secondary | ICD-10-CM | POA: Diagnosis present

## 2019-05-23 DIAGNOSIS — R197 Diarrhea, unspecified: Secondary | ICD-10-CM | POA: Diagnosis present

## 2019-05-23 DIAGNOSIS — J1282 Pneumonia due to coronavirus disease 2019: Secondary | ICD-10-CM | POA: Diagnosis present

## 2019-05-23 DIAGNOSIS — X58XXXA Exposure to other specified factors, initial encounter: Secondary | ICD-10-CM | POA: Diagnosis not present

## 2019-05-23 DIAGNOSIS — R531 Weakness: Secondary | ICD-10-CM | POA: Diagnosis not present

## 2019-05-23 DIAGNOSIS — J9621 Acute and chronic respiratory failure with hypoxia: Secondary | ICD-10-CM | POA: Diagnosis present

## 2019-05-23 DIAGNOSIS — T380X5A Adverse effect of glucocorticoids and synthetic analogues, initial encounter: Secondary | ICD-10-CM | POA: Diagnosis not present

## 2019-05-23 DIAGNOSIS — K219 Gastro-esophageal reflux disease without esophagitis: Secondary | ICD-10-CM | POA: Diagnosis present

## 2019-05-23 DIAGNOSIS — J449 Chronic obstructive pulmonary disease, unspecified: Secondary | ICD-10-CM | POA: Diagnosis present

## 2019-05-23 LAB — CBC WITH DIFFERENTIAL/PLATELET
Abs Immature Granulocytes: 0.01 10*3/uL (ref 0.00–0.07)
Basophils Absolute: 0 10*3/uL (ref 0.0–0.1)
Basophils Relative: 0 %
Eosinophils Absolute: 0 10*3/uL (ref 0.0–0.5)
Eosinophils Relative: 0 %
HCT: 45.5 % (ref 36.0–46.0)
Hemoglobin: 14.8 g/dL (ref 12.0–15.0)
Immature Granulocytes: 0 %
Lymphocytes Relative: 21 %
Lymphs Abs: 1.3 10*3/uL (ref 0.7–4.0)
MCH: 31 pg (ref 26.0–34.0)
MCHC: 32.5 g/dL (ref 30.0–36.0)
MCV: 95.2 fL (ref 80.0–100.0)
Monocytes Absolute: 0.4 10*3/uL (ref 0.1–1.0)
Monocytes Relative: 7 %
Neutro Abs: 4.3 10*3/uL (ref 1.7–7.7)
Neutrophils Relative %: 72 %
Platelets: 141 10*3/uL — ABNORMAL LOW (ref 150–400)
RBC: 4.78 MIL/uL (ref 3.87–5.11)
RDW: 12.6 % (ref 11.5–15.5)
WBC: 6 10*3/uL (ref 4.0–10.5)
nRBC: 0 % (ref 0.0–0.2)

## 2019-05-23 LAB — COMPREHENSIVE METABOLIC PANEL
ALT: 14 U/L (ref 0–44)
AST: 23 U/L (ref 15–41)
Albumin: 3.8 g/dL (ref 3.5–5.0)
Alkaline Phosphatase: 63 U/L (ref 38–126)
Anion gap: 12 (ref 5–15)
BUN: 11 mg/dL (ref 6–20)
CO2: 27 mmol/L (ref 22–32)
Calcium: 9.2 mg/dL (ref 8.9–10.3)
Chloride: 102 mmol/L (ref 98–111)
Creatinine, Ser: 0.91 mg/dL (ref 0.44–1.00)
GFR calc Af Amer: >60 mL/min (ref >60)
GFR calc non Af Amer: >60 mL/min (ref >60)
Glucose, Bld: 103 mg/dL — ABNORMAL HIGH (ref 70–99)
Potassium: 4.4 mmol/L (ref 3.5–5.1)
Sodium: 141 mmol/L (ref 135–145)
Total Bilirubin: 0.4 mg/dL (ref 0.3–1.2)
Total Protein: 6.8 g/dL (ref 6.5–8.1)

## 2019-05-23 LAB — SARS CORONAVIRUS 2 BY RT PCR (HOSPITAL ORDER, PERFORMED IN ~~LOC~~ HOSPITAL LAB): SARS Coronavirus 2: POSITIVE — AB

## 2019-05-23 LAB — LACTIC ACID, PLASMA: Lactic Acid, Venous: 0.7 mmol/L (ref 0.5–1.9)

## 2019-05-23 LAB — CBG MONITORING, ED: Glucose-Capillary: 104 mg/dL — ABNORMAL HIGH (ref 70–99)

## 2019-05-23 LAB — BRAIN NATRIURETIC PEPTIDE: B Natriuretic Peptide: 12.8 pg/mL (ref 0.0–100.0)

## 2019-05-23 LAB — TROPONIN I: Troponin I: <0.03 ng/mL (ref <0.03)

## 2019-05-23 MED ORDER — SODIUM CHLORIDE 0.9 % IV SOLN
500.0000 mg | INTRAVENOUS | Status: DC
  Administered 2019-05-23: 500 mg via INTRAVENOUS
  Filled 2019-05-23: qty 500

## 2019-05-23 MED ORDER — MAGNESIUM SULFATE 2 GM/50ML IV SOLN
2.0000 g | Freq: Once | INTRAVENOUS | Status: AC
  Administered 2019-05-23: 2 g via INTRAVENOUS
  Filled 2019-05-23: qty 50

## 2019-05-23 MED ORDER — IPRATROPIUM BROMIDE HFA 17 MCG/ACT IN AERS
2.0000 | INHALATION_SPRAY | Freq: Once | RESPIRATORY_TRACT | Status: AC
  Administered 2019-05-23: 2 via RESPIRATORY_TRACT
  Filled 2019-05-23: qty 12.9

## 2019-05-23 MED ORDER — LACTATED RINGERS IV BOLUS
1000.0000 mL | Freq: Once | INTRAVENOUS | Status: AC
  Administered 2019-05-23: 23:00:00 1000 mL via INTRAVENOUS

## 2019-05-23 MED ORDER — ALBUTEROL SULFATE HFA 108 (90 BASE) MCG/ACT IN AERS
8.0000 | INHALATION_SPRAY | Freq: Once | RESPIRATORY_TRACT | Status: AC
  Administered 2019-05-23: 8 via RESPIRATORY_TRACT
  Filled 2019-05-23: qty 6.7

## 2019-05-23 MED ORDER — AEROCHAMBER PLUS FLO-VU MISC
1.0000 | Freq: Once | Status: DC
  Filled 2019-05-23: qty 1

## 2019-05-23 MED ORDER — SODIUM CHLORIDE 0.9 % IV SOLN
2.0000 g | INTRAVENOUS | Status: DC
  Administered 2019-05-23: 2 g via INTRAVENOUS
  Filled 2019-05-23: qty 20

## 2019-05-23 MED ORDER — METHYLPREDNISOLONE SODIUM SUCC 125 MG IJ SOLR
125.0000 mg | Freq: Once | INTRAMUSCULAR | Status: AC
  Administered 2019-05-23: 125 mg via INTRAVENOUS
  Filled 2019-05-23: qty 2

## 2019-05-23 MED ORDER — ACETAMINOPHEN 325 MG PO TABS
650.0000 mg | ORAL_TABLET | Freq: Once | ORAL | Status: AC
  Administered 2019-05-23: 650 mg via ORAL
  Filled 2019-05-23: qty 2

## 2019-05-23 NOTE — ED Provider Notes (Signed)
[facility] EMERGENCY DEPARTMENT Provider Note   CSN: [id number] Arrival date & time: 05/23/19  2056    History   Chief Complaint Chief Complaint  Patient presents with  . Shortness of Breath    HPI Madison Phillips is a 53 y.o. female.     HPI  53 year old female presents with shortness of breath, weakness, lightheadedness.  Started about 3 days ago.  Legs feel like they are going to give out on her.  She is also been clearing her throat with some mucus and increasing shortness of breath.  She wears 2 L of oxygen at home.  Oxygen has been fluctuating.  She did not bring it today and so when she checked and her O2 sats were in the 7s.  She has also had chest pressure.  Some mild headache.  Past Medical History:  Diagnosis Date  . COPD (chronic obstructive pulmonary disease) ([place])   . GERD (gastroesophageal reflux disease) 12/25/2018  . 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 Left ear hearing loss 12/05/2018    Patient Active Problem List   Diagnosis Date Noted  . Chronic respiratory failure with hypoxia ([place]) 01/23/2019  . Hypertension 12/25/2018  . GERD (gastroesophageal reflux disease) 12/25/2018  . Left ear hearing loss 12/05/2018  . 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 Varicose veins of left lower extremity 12/05/2018  . 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 cyber knife    . ectopic pregnanacy       OB History   No obstetric history on file.      Home Medications    Prior to Admission medications   Medication Sig Start Date End Date Taking? Authorizing Provider  ibuprofen (ADVIL) 800 MG tablet Take 1 tablet (800 mg total) by mouth every 8 (eight) hours as needed. 05/21/19  Yes Kathrynn Ducking, MD  ipratropium-albuterol (DUONEB) 0.5-2.5 (3) MG/3ML SOLN Use 4 times daily on schedule Patient taking differently: Take 3 mLs by nebulization 4 (four) times daily.  03/26/19  Yes Elsie Stain, MD  albuterol (PROVENTIL) (2.5 MG/3ML) 0.083% nebulizer solution Take 3 mLs (2.5 mg total) by nebulization every 6 (six) hours as needed for wheezing or shortness of breath. Patient not taking: Reported on 05/23/2019 03/26/19 03/25/20  Elsie Stain, MD  carbamazepine (TEGRETOL) 200 MG tablet 1/2 tablet twice daily for 2 weeks then take 1 full tablet twice daily Patient not taking: Reported on 05/23/2019 05/21/19   Kathrynn Ducking, MD  Fluticasone-Umeclidin-Vilant (TRELEGY ELLIPTA) 100-62.5-25 MCG/INH AEPB Inhale 1 puff into the lungs daily. Patient not taking: Reported on 04/19/2019 04/12/19   Elsie Stain, MD    Family History Family History  Problem Relation Age of Onset  . Hypothyroidism Mother   . Diabetes Mellitus II Maternal Aunt   . Diabetes Maternal Aunt   . Cancer Father     Social History Social History   Tobacco Use  . Smoking status: Former Research scientist (life sciences)  . Smokeless tobacco: Never Used  Substance Use Topics  . Alcohol use: Yes    Alcohol/week: 2.0 - 3.0 standard drinks    Types: 2 - 3 Cans of beer per week    Comment: a night  . Drug use: Not Currently     Allergies   Patient has no known allergies.   Review of Systems Review of Systems  Respiratory: Positive for cough and shortness of breath.   Cardiovascular: Positive  for chest pain.  Gastrointestinal: Positive for vomiting (2x over past 2 days). Negative for abdominal pain.  Neurological: Positive for weakness and light-headedness.  All other systems reviewed and are negative.    Physical Exam Updated Vital Signs BP (!) 139/98   Pulse (!) 106   Temp (!) 100.5 F (38.1 C) (Oral)   Resp (!) 25   Ht 5\' 10"  (1.778 m)   Wt 77.1 kg   SpO2 (!) 89%   BMI 24.39 kg/m   Physical Exam Vitals signs and nursing note reviewed.  Constitutional:      Appearance: She is well-developed.  HENT:     Head: Normocephalic and atraumatic.     Right Ear: External ear normal.     Left Ear: External ear  normal.     Nose: Nose normal.  Eyes:     General:        Right eye: No discharge.        Left eye: No discharge.  Cardiovascular:     Rate and Rhythm: Regular rhythm. Tachycardia present.     Heart sounds: Normal heart sounds.  Pulmonary:     Effort: Pulmonary effort is normal. Tachypnea present.     Breath sounds: Decreased breath sounds (diffusely) present.  Abdominal:     Palpations: Abdomen is soft.     Tenderness: There is no abdominal tenderness.  Skin:    General: Skin is warm and dry.  Neurological:     Mental Status: She is alert.  Psychiatric:        Mood and Affect: Mood is not anxious.      ED Treatments / Results  Labs (all labs ordered are listed, but only abnormal results are displayed) Labs Reviewed  COMPREHENSIVE METABOLIC PANEL - Abnormal; Notable for the following components:      Result Value   Glucose, Bld 103 (*)    All other components within normal limits  CBC WITH DIFFERENTIAL/PLATELET - Abnormal; Notable for the following components:   Platelets 141 (*)    All other components within normal limits  CBG MONITORING, ED - Abnormal; Notable for the following components:   Glucose-Capillary 104 (*)    All other components within normal limits  CULTURE, BLOOD (ROUTINE X 2)  CULTURE, BLOOD (ROUTINE X 2)  SARS CORONAVIRUS 2 (HOSPITAL ORDER, [place] [facility])  LACTIC ACID, PLASMA  BRAIN NATRIURETIC PEPTIDE  TROPONIN I  LACTIC ACID, PLASMA  URINALYSIS, ROUTINE W REFLEX MICROSCOPIC    EKG EKG Interpretation  Date/Time:  Wednesday May 23 2019 21:40:06 EDT Ventricular Rate:  114 PR Interval:    QRS Duration: 76 QT Interval:  329 QTC Calculation: 453 R Axis:   79 Text Interpretation:  Sinus tachycardia no acute ST/T changes rate is faster compared to Feb 2020 Confirmed by Sherwood Gambler [phone]) on 05/23/2019 9:45:02 PM   Radiology Dg Chest Port 1 View  Result Date: 05/23/2019 CLINICAL DATA:  Dyspnea EXAM: PORTABLE CHEST  1 VIEW COMPARISON:  12/24/2018 FINDINGS: There is a new right lower lobe airspace opacity. Emphysematous changes are noted bilaterally. There is scarring versus atelectasis at the right lung base. The heart size is stable from prior study. IMPRESSION: New right lower lung zone airspace opacity concerning for developing pneumonia. Electronically Signed   By: Constance Holster M.D.   On: 05/23/2019 22:03    Procedures Procedures (including critical care time)  Medications Ordered in ED Medications  aerochamber plus with mask device 1 each (has no administration  in time range)  magnesium sulfate IVPB 2 g 50 mL (2 g Intravenous New Bag/Given 05/23/19 2237)  cefTRIAXone (ROCEPHIN) 2 g in sodium chloride 0.9 % 100 mL IVPB (has no administration in time range)  azithromycin (ZITHROMAX) 500 mg in sodium chloride 0.9 % 250 mL IVPB (500 mg Intravenous New Bag/Given 05/23/19 2239)  albuterol (VENTOLIN HFA) 108 (90 Base) MCG/ACT inhaler 8 puff (8 puffs Inhalation Given 05/23/19 2226)  ipratropium (ATROVENT HFA) inhaler 2 puff (2 puffs Inhalation Given 05/23/19 2230)  lactated ringers bolus 1,000 mL (1,000 mLs Intravenous New Bag/Given 05/23/19 2232)  acetaminophen (TYLENOL) tablet 650 mg (650 mg Oral Given 05/23/19 2226)  methylPREDNISolone sodium succinate (SOLU-MEDROL) 125 mg/2 mL injection 125 mg (125 mg Intravenous Given 05/23/19 2227)     Initial Impression / Assessment and Plan / ED Course  I have reviewed the triage vital signs and the nursing notes.  Pertinent labs & imaging results that were available during my care of the patient were reviewed by me and considered in my medical decision making (see chart for details).        Patient has poor air movement diffusely, likely a component of her COPD.  However x-ray is also concerning for pneumonia.  Given IV antibiotics for pneumonia.  Given steroids, magnesium and albuterol for the COPD.  Coronavirus test is positive.She will need admission given the  oxygen support and tachypnea. However she does not need intubation or more advanced airway support.  Discussed with Dr. Hal Hope, who will admit.  OLUBUNMI ROTHENBERGER was evaluated in Emergency Department on 05/23/2019 for the symptoms described in the history of present illness. She was evaluated in the context of the global COVID-19 pandemic, which necessitated consideration that the patient might be at risk for infection with the SARS-CoV-2 virus that causes COVID-19. Institutional protocols and algorithms that pertain to the evaluation of patients at risk for COVID-19 are in a state of rapid change based on information released by regulatory bodies including the CDC and federal and state organizations. These policies and algorithms were followed during the patient's care in the ED.   Final Clinical Impressions(s) / ED Diagnoses   Final diagnoses:  Community acquired pneumonia of right lower lobe of lung ([place])  COPD exacerbation [facility])    ED Discharge Orders    None       Sherwood Gambler, MD 05/23/19 2311

## 2019-05-23 NOTE — ED Triage Notes (Signed)
Pt c/o increasing shortness of breath and chest heaviness x 2 days, worsening today. Pt speaking in short phrases. SpO2 73% on room air, pt states she is supposed to wear 2L [place], but left it at home. Placed on [place] in triage.

## 2019-05-23 NOTE — Telephone Encounter (Signed)
New Message  Pt states she needing an update on her lung evaluation. Please f/u

## 2019-05-23 NOTE — Telephone Encounter (Signed)
Staff called Pulmonary Scheduling and LMOM due to no one picked up. LMOM with patient MRN number and asking their department to please call patient to schedule her to get her PFT. Called patient and spoke with patient informing her that staff called Pulmonary Scheduling and [facility] for them to call her back to schedule that appt. Staff did provide patient with scheduling number just in case she miss their call or if they don't call her back and she can call them to schedule this appt.

## 2019-05-23 NOTE — Telephone Encounter (Signed)
Per previous note. Patient verbalized understanding.

## 2019-05-24 ENCOUNTER — Other Ambulatory Visit: Payer: Self-pay

## 2019-05-24 ENCOUNTER — Encounter (HOSPITAL_COMMUNITY): Payer: Self-pay | Admitting: Internal Medicine

## 2019-05-24 DIAGNOSIS — T380X5A Adverse effect of glucocorticoids and synthetic analogues, initial encounter: Secondary | ICD-10-CM | POA: Diagnosis not present

## 2019-05-24 DIAGNOSIS — J9621 Acute and chronic respiratory failure with hypoxia: Secondary | ICD-10-CM | POA: Diagnosis not present

## 2019-05-24 DIAGNOSIS — J432 Centrilobular emphysema: Secondary | ICD-10-CM | POA: Diagnosis not present

## 2019-05-24 DIAGNOSIS — E860 Dehydration: Secondary | ICD-10-CM | POA: Diagnosis not present

## 2019-05-24 DIAGNOSIS — H9192 Unspecified hearing loss, left ear: Secondary | ICD-10-CM | POA: Diagnosis not present

## 2019-05-24 DIAGNOSIS — J1282 Pneumonia due to coronavirus disease 2019: Secondary | ICD-10-CM

## 2019-05-24 DIAGNOSIS — Z833 Family history of diabetes mellitus: Secondary | ICD-10-CM | POA: Diagnosis not present

## 2019-05-24 DIAGNOSIS — R0789 Other chest pain: Secondary | ICD-10-CM | POA: Diagnosis not present

## 2019-05-24 DIAGNOSIS — X58XXXA Exposure to other specified factors, initial encounter: Secondary | ICD-10-CM | POA: Diagnosis not present

## 2019-05-24 DIAGNOSIS — Z86018 Personal history of other benign neoplasm: Secondary | ICD-10-CM | POA: Diagnosis not present

## 2019-05-24 DIAGNOSIS — J9601 Acute respiratory failure with hypoxia: Secondary | ICD-10-CM

## 2019-05-24 DIAGNOSIS — U071 COVID-19: Secondary | ICD-10-CM

## 2019-05-24 DIAGNOSIS — R918 Other nonspecific abnormal finding of lung field: Secondary | ICD-10-CM | POA: Diagnosis not present

## 2019-05-24 DIAGNOSIS — Z79899 Other long term (current) drug therapy: Secondary | ICD-10-CM | POA: Diagnosis not present

## 2019-05-24 DIAGNOSIS — Z791 Long term (current) use of non-steroidal anti-inflammatories (NSAID): Secondary | ICD-10-CM | POA: Diagnosis not present

## 2019-05-24 DIAGNOSIS — J1289 Other viral pneumonia: Secondary | ICD-10-CM

## 2019-05-24 DIAGNOSIS — J189 Pneumonia, unspecified organism: Secondary | ICD-10-CM | POA: Diagnosis not present

## 2019-05-24 DIAGNOSIS — R531 Weakness: Secondary | ICD-10-CM | POA: Diagnosis not present

## 2019-05-24 DIAGNOSIS — J439 Emphysema, unspecified: Secondary | ICD-10-CM | POA: Diagnosis not present

## 2019-05-24 DIAGNOSIS — J181 Lobar pneumonia, unspecified organism: Secondary | ICD-10-CM | POA: Diagnosis not present

## 2019-05-24 DIAGNOSIS — R197 Diarrhea, unspecified: Secondary | ICD-10-CM | POA: Diagnosis not present

## 2019-05-24 DIAGNOSIS — Z87891 Personal history of nicotine dependence: Secondary | ICD-10-CM | POA: Diagnosis not present

## 2019-05-24 DIAGNOSIS — K219 Gastro-esophageal reflux disease without esophagitis: Secondary | ICD-10-CM | POA: Diagnosis not present

## 2019-05-24 HISTORY — DX: COVID-19: U07.1

## 2019-05-24 HISTORY — DX: Pneumonia due to coronavirus disease 2019: J12.82

## 2019-05-24 HISTORY — DX: Acute respiratory failure with hypoxia: J96.01

## 2019-05-24 LAB — COMPREHENSIVE METABOLIC PANEL
ALT: 13 U/L (ref 0–44)
AST: 21 U/L (ref 15–41)
Albumin: 3.5 g/dL (ref 3.5–5.0)
Alkaline Phosphatase: 57 U/L (ref 38–126)
Anion gap: 9 (ref 5–15)
BUN: 12 mg/dL (ref 6–20)
CO2: 27 mmol/L (ref 22–32)
Calcium: 8.4 mg/dL — ABNORMAL LOW (ref 8.9–10.3)
Chloride: 106 mmol/L (ref 98–111)
Creatinine, Ser: 0.73 mg/dL (ref 0.44–1.00)
GFR calc Af Amer: >60 mL/min (ref >60)
GFR calc non Af Amer: >60 mL/min (ref >60)
Glucose, Bld: 156 mg/dL — ABNORMAL HIGH (ref 70–99)
Potassium: 4.4 mmol/L (ref 3.5–5.1)
Sodium: 142 mmol/L (ref 135–145)
Total Bilirubin: 0.2 mg/dL — ABNORMAL LOW (ref 0.3–1.2)
Total Protein: 7 g/dL (ref 6.5–8.1)

## 2019-05-24 LAB — PROCALCITONIN: Procalcitonin: <0.1 ng/mL

## 2019-05-24 LAB — CBC
HCT: 42.6 % (ref 36.0–46.0)
Hemoglobin: 13.6 g/dL (ref 12.0–15.0)
MCH: 30.5 pg (ref 26.0–34.0)
MCHC: 31.9 g/dL (ref 30.0–36.0)
MCV: 95.5 fL (ref 80.0–100.0)
Platelets: 133 10*3/uL — ABNORMAL LOW (ref 150–400)
RBC: 4.46 MIL/uL (ref 3.87–5.11)
RDW: 12.8 % (ref 11.5–15.5)
WBC: 6.1 10*3/uL (ref 4.0–10.5)
nRBC: 0 % (ref 0.0–0.2)

## 2019-05-24 LAB — CREATININE, SERUM
Creatinine, Ser: 0.81 mg/dL (ref 0.44–1.00)
GFR calc Af Amer: >60 mL/min (ref >60)
GFR calc non Af Amer: >60 mL/min (ref >60)

## 2019-05-24 LAB — FERRITIN: Ferritin: 154 ng/mL (ref 11–307)

## 2019-05-24 LAB — D-DIMER, QUANTITATIVE: D-Dimer, Quant: 0.44 ug/mL-FEU (ref 0.00–0.50)

## 2019-05-24 LAB — C-REACTIVE PROTEIN: CRP: 8.5 mg/dL — ABNORMAL HIGH (ref <1.0)

## 2019-05-24 MED ORDER — ONDANSETRON HCL 4 MG PO TABS: 4.0000 mg | ORAL_TABLET | Freq: Four times a day (QID) | ORAL | Status: DC | PRN

## 2019-05-24 MED ORDER — VITAMIN C 500 MG PO TABS
500.0000 mg | ORAL_TABLET | Freq: Every day | ORAL | Status: DC
  Administered 2019-05-24 – 2019-05-28 (×5): 500 mg via ORAL
  Filled 2019-05-24 (×5): qty 1

## 2019-05-24 MED ORDER — ACETAMINOPHEN 325 MG PO TABS
650.0000 mg | ORAL_TABLET | Freq: Four times a day (QID) | ORAL | Status: DC | PRN
  Administered 2019-05-25: 650 mg via ORAL
  Filled 2019-05-24: qty 2

## 2019-05-24 MED ORDER — METHYLPREDNISOLONE SODIUM SUCC 40 MG IJ SOLR
40.0000 mg | Freq: Three times a day (TID) | INTRAMUSCULAR | Status: DC
  Administered 2019-05-24 – 2019-05-26 (×6): 40 mg via INTRAVENOUS
  Filled 2019-05-24 (×6): qty 1

## 2019-05-24 MED ORDER — VITAMIN C 500 MG/5ML PO SYRP: 500.0000 mg | ORAL_SOLUTION | Freq: Every day | ORAL | Status: DC

## 2019-05-24 MED ORDER — ACETAMINOPHEN 650 MG RE SUPP: 650.0000 mg | Freq: Four times a day (QID) | RECTAL | Status: DC | PRN

## 2019-05-24 MED ORDER — ENOXAPARIN SODIUM 40 MG/0.4ML ~~LOC~~ SOLN: 40.0000 mg | Freq: Every day | SUBCUTANEOUS | Status: DC

## 2019-05-24 MED ORDER — FAMOTIDINE 20 MG PO TABS
20.0000 mg | ORAL_TABLET | Freq: Two times a day (BID) | ORAL | Status: DC
  Administered 2019-05-24 – 2019-05-28 (×9): 20 mg via ORAL
  Filled 2019-05-24 (×9): qty 1

## 2019-05-24 MED ORDER — SODIUM CHLORIDE 0.9 % IV SOLN
200.0000 mg | Freq: Once | INTRAVENOUS | Status: AC
  Administered 2019-05-24: 200 mg via INTRAVENOUS
  Filled 2019-05-24: qty 40

## 2019-05-24 MED ORDER — ZINC SULFATE 220 (50 ZN) MG PO CAPS
220.0000 mg | ORAL_CAPSULE | Freq: Every day | ORAL | Status: DC
  Administered 2019-05-24 – 2019-05-28 (×5): 220 mg via ORAL
  Filled 2019-05-24 (×6): qty 1

## 2019-05-24 MED ORDER — ENOXAPARIN SODIUM 40 MG/0.4ML ~~LOC~~ SOLN
40.0000 mg | SUBCUTANEOUS | Status: DC
  Administered 2019-05-24 – 2019-05-28 (×5): 40 mg via SUBCUTANEOUS
  Filled 2019-05-24 (×5): qty 0.4

## 2019-05-24 MED ORDER — METHYLPREDNISOLONE SODIUM SUCC 40 MG IJ SOLR
40.0000 mg | Freq: Two times a day (BID) | INTRAMUSCULAR | Status: DC
  Administered 2019-05-24: 40 mg via INTRAVENOUS
  Filled 2019-05-24: qty 1

## 2019-05-24 MED ORDER — METHYLPREDNISOLONE SODIUM SUCC 40 MG IJ SOLR: 40.0000 mg | Freq: Every day | INTRAMUSCULAR | Status: DC

## 2019-05-24 MED ORDER — ONDANSETRON HCL 4 MG/2ML IJ SOLN: 4.0000 mg | Freq: Four times a day (QID) | INTRAMUSCULAR | Status: DC | PRN

## 2019-05-24 MED ORDER — SODIUM CHLORIDE 0.9 % IV SOLN
100.0000 mg | INTRAVENOUS | Status: AC
  Administered 2019-05-25 – 2019-05-28 (×4): 100 mg via INTRAVENOUS
  Filled 2019-05-24 (×4): qty 20

## 2019-05-24 NOTE — ED Notes (Signed)
Attempted to call report to [facility] x3. Disconnects first two times and no answer 3rd time.

## 2019-05-24 NOTE — Progress Notes (Signed)
PROGRESS NOTE    Patient: Madison Phillips                            PCP: [name], MD                    DOB: October 19, 1966            DOA: 05/23/2019 [id number]             DOS: 05/24/2019, 4:53 AM   LOS: 0 days   Date of Service: The patient was seen and examined on 05/24/2019  Subjective:   Patient was seen and briefly examined.  She tired and sleepy on arrives on the floor in stable condition. Currently 91% on 2 L of oxygen by nasal cannula   Brief Narrative:   Madison Phillips is a 53 y.o. female with history of COPD caustic neuroma presents to the ER because of increasing shortness of breath with some chest tightness x2 days.  Also feeling increasingly dizzy.  Dizziness is present even at rest increased on moving.  Denies any focal deficit headache nausea vomiting abdominal pain.  Upper arrival temp 100.5/tachycardia at 123, x-ray positive for infiltrate, hypoxia on 5 L of oxygen SARS-COVID positive Blood cultures obtained Started on steroids IV antibiotics    Assessment & Plan:     Principal Problem:   Acute respiratory failure with hypoxia (HCC)    Pneumonia due to COVID-19 virus Active Problems:   History of acoustic neuroma   COPD with emphysema (HCC)  Acute respiratory failure with hypoxia secondary to COVID pneumonia  -Blood cultures been obtained, -Continue initiated empiric antibiotics -Initiated IV antibiotics, was consulted forRemdesvir.  -Following with labs, including procalcitonin level, LDH, D-dimer, ferritin, ARB,  COPD exacerbation super imposed infection with SARS-COVID  -Continue albuterol inhaler, IV steroids - Dizziness could be from hypoxia.   Patient does have a history of a carcinoma being followed by Dr. Jannifer Franklin neurologist has had recent MRI 10 days ago.  Appears nonfocal.  Diarrhea likely from [place]. -Severe dehydration,  Chest pain/tightness  -Chest pain-free -likely from respiratory failure with secondary to COPD and hypoxia.    -Trending check troponin. -Continue supportive therapy   DVT prophylaxis: Lovenox. Code Status: Full code. Family Communication: Discussed with patient. Disposition Plan: Home. Consults called: None. Admission status: Inpatient.   Procedures:   No admission procedures for hospital encounter.    Antimicrobials:  Anti-infectives (From admission, onward)   Start     Dose/Rate Route Frequency Ordered Stop   05/25/19 0800  remdesivir 100 mg in sodium chloride 0.9 % 230 mL IVPB     100 mg 500 mL/hr over 30 Minutes Intravenous Every 24 hours 05/24/19 0355 05/29/19 0759   05/24/19 0430  remdesivir 200 mg in sodium chloride 0.9 % 210 mL IVPB     200 mg 500 mL/hr over 30 Minutes Intravenous Once 05/24/19 0355     05/23/19 2230  cefTRIAXone (ROCEPHIN) 2 g in sodium chloride 0.9 % 100 mL IVPB     2 g 200 mL/hr over 30 Minutes Intravenous Every 24 hours 05/23/19 2219     05/23/19 2230  azithromycin (ZITHROMAX) 500 mg in sodium chloride 0.9 % 250 mL IVPB     500 mg 250 mL/hr over 60 Minutes Intravenous Every 24 hours 05/23/19 2219         Medication:  . aerochamber plus with mask  1 each Other Once  .  methylPREDNISolone (SOLU-MEDROL) injection  40 mg Intravenous Q12H  . vitamin C  500 mg Oral Daily  . zinc sulfate  220 mg Oral Daily    acetaminophen **OR** acetaminophen, ondansetron **OR** ondansetron (ZOFRAN) IV     Objective:   Vitals:   05/24/19 0117 05/24/19 0119 05/24/19 0143 05/24/19 0240  BP:    125/84  Pulse:   91 80  Resp:  (!) 21  20  Temp: 98.3 F (36.8 C)   97.8 F (36.6 C)  TempSrc: Oral   Oral  SpO2:   90% 91%  Weight:      Height:    5\' 10"  (1.778 m)    Intake/Output Summary (Last 24 hours) at 05/24/2019 0453 Last data filed at 05/24/2019 0300 Gross per 24 hour  Intake 240 ml  Output -  Net 240 ml   Filed Weights   05/23/19 2140  Weight: 77.1 kg     Examination:   Physical Exam  Constitution:  Alert, cooperative, no distress,  Appears  calm and comfortable  HEENT: Normocephalic, PERRL, otherwise with in Normal limits  Chest:Chest symmetric Cardio vascular:  S1/S2, RRR, No murmure, No Rubs or Gallops  pulmonary: Clear to auscultation bilaterally, respirations unlabored, negative wheezes / crackles Abdomen: Soft, non-tender, non-distended, bowel sounds,no masses, no organomegaly Muscular skeletal: Limited exam - in bed, able to move all 4 extremities, Normal strength,  Neuro: CNII-XII intact. , normal motor and sensation, reflexes intact  Extremities: No pitting edema lower extremities, +2 pulses  Skin: Dry, warm to touch, negative for any Rashes, No open wounds Wounds: per nursing documentation  LABs:  CBC Latest Ref Rng & Units 05/24/2019 05/23/2019 02/03/2019  WBC 4.0 - 10.5 K/uL 6.1 6.0 -  Hemoglobin 12.0 - 15.0 g/dL 13.6 14.8 13.3  Hematocrit 36.0 - 46.0 % 42.6 45.5 39.0  Platelets 150 - 400 K/uL 133(L) 141(L) -   CMP Latest Ref Rng & Units 05/24/2019 05/23/2019 02/03/2019  Glucose 70 - 99 mg/dL - 103(H) -  BUN 6 - 20 mg/dL - 11 -  Creatinine 0.44 - 1.00 mg/dL 0.81 0.91 1.00  Sodium 135 - 145 mmol/L - 141 140  Potassium 3.5 - 5.1 mmol/L - 4.4 3.8  Chloride 98 - 111 mmol/L - 102 -  CO2 22 - 32 mmol/L - 27 -  Calcium 8.9 - 10.3 mg/dL - 9.2 -  Total Protein 6.5 - 8.1 g/dL - 6.8 -  Total Bilirubin 0.3 - 1.2 mg/dL - 0.4 -  Alkaline Phos 38 - 126 U/L - 63 -  AST 15 - 41 U/L - 23 -  ALT 0 - 44 U/L - 14 -        SIGNED: Deatra James, MD, FACP, FHM. [facility],  Pager [phone]  If 7PM-7AM, please contact night-coverage [email or web address], Password [facility] 05/24/2019, 4:53 AM

## 2019-05-24 NOTE — Progress Notes (Signed)
PROGRESS NOTE                                                                                                                                                                                                             Patient Demographics:    Madison Phillips, is a 53 y.o. female, DOB - [birth year], WHQ:[id number]  Admit date - 05/23/2019   Admitting Physician Rise Patience, MD  Outpatient Primary MD for the patient is [name], MD  LOS - 0   Chief Complaint  Patient presents with  . Shortness of Breath       Brief Narrative    This is a no charge note as patient admitted earlier today.  53 y.o. female with history of COPD ,Acaustic neuroma presents to the ER because of increasing shortness of breath with some chest tightness x2 days.  On some dizziness,(dizziness is related to her history of left acoustic schwannoma), he was noted to be hypoxic, requiring 2 L nasal cannula, she tested positive for COVID-19, transferred to [facility] with admission for further management.   Subjective:    Madison Phillips today reports no further dizziness, dyspnea has improved, reports cough, nonproductive    Assessment  & Plan :    Principal Problem:   Acute respiratory failure with hypoxia ([place]) Active Problems:   History of acoustic neuroma   COPD with emphysema ([place])   Pneumonia due to COVID-19 virus  Acute respiratory failure with hypoxia secondary to COVID pneumonia  -patient has been placed on empiric antibiotics for community-acquired pneumonia, procalcitonin within normal limit, this is viral pneumonia, -Started on IV Solu-Medrol 05/24/2019 -Started on Remdesivir 05/24/2019 -We will hold on Actemra -Continue to trend inflammatory markers  COVID-19 Labs  Recent Labs    05/24/19 0800  DDIMER 0.44  FERRITIN 154  CRP 8.5*    Lab Results  Component Value Date   SARSCOV2NAA POSITIVE (A) 05/23/2019     COPD on exam patient not actively  wheezing will continue albuterol inhaler and if patient is on steroids.  Dizziness could be from hypoxia.  Patient does have a history of a carcinoma being followed by Dr. Jannifer [facility] neurologist has had recent MRI 10 days ago.  Appears nonfocal.  Diarrhea likely from [place].  Chest tightness likely from respiratory failure with secondary to COPD and hypoxia.  Will  check troponin.     Code Status : Full  Family Communication  : D/W patient  Disposition Plan  : Home when stable  Consults  :  None  Procedures  : None  DVT Prophylaxis  :  Lovenox  Lab Results  Component Value Date   PLT 133 (L) 05/24/2019    Antibiotics  :   Anti-infectives (From admission, onward)   Start     Dose/Rate Route Frequency Ordered Stop   05/25/19 0800  remdesivir 100 mg in sodium chloride 0.9 % 230 mL IVPB     100 mg 500 mL/hr over 30 Minutes Intravenous Every 24 hours 05/24/19 0355 05/29/19 0759   05/24/19 0430  remdesivir 200 mg in sodium chloride 0.9 % 210 mL IVPB     200 mg 500 mL/hr over 30 Minutes Intravenous Once 05/24/19 0355 05/24/19 0526   05/23/19 2230  cefTRIAXone (ROCEPHIN) 2 g in sodium chloride 0.9 % 100 mL IVPB     2 g 200 mL/hr over 30 Minutes Intravenous Every 24 hours 05/23/19 2219     05/23/19 2230  azithromycin (ZITHROMAX) 500 mg in sodium chloride 0.9 % 250 mL IVPB     500 mg 250 mL/hr over 60 Minutes Intravenous Every 24 hours 05/23/19 2219          Objective:   Vitals:   05/24/19 0400 05/24/19 0600 05/24/19 0845 05/24/19 0900  BP: 109/76 113/80  125/77  Pulse: 81 73  81  Resp: (!) 26 (!) 26  (!) 22  Temp: 98 F (36.7 C)  98.1 F (36.7 C)   TempSrc: Oral  Oral   SpO2: 92% 92%  92%  Weight:      Height:        Wt Readings from Last 3 Encounters:  05/23/19 77.1 kg  02/03/19 79.4 kg  01/30/19 80.7 kg     Intake/Output Summary (Last 24 hours) at 05/24/2019 1140 Last data filed at 05/24/2019 0954 Gross per 24 hour  Intake 480 ml  Output -  Net 480 ml      Physical Exam  Awake Alert, Oriented X 3, No new F.N deficits, Normal affect Symmetrical Chest wall movement, Good air movement bilaterally, CTAB RRR,No Gallops,Rubs or new Murmurs, No Parasternal Heave +ve B.Sounds, Abd Soft, No tenderness, No rebound - guarding or rigidity. No Cyanosis, Clubbing or edema, No new Rash or bruise      Data Review:    CBC Recent Labs  Lab 05/23/19 2128 05/24/19 0125  WBC 6.0 6.1  HGB 14.8 13.6  HCT 45.5 42.6  PLT 141* 133*  MCV 95.2 95.5  MCH 31.0 30.5  MCHC 32.5 31.9  RDW 12.6 12.8  LYMPHSABS 1.3  --   MONOABS 0.4  --   EOSABS 0.0  --   BASOSABS 0.0  --     Chemistries  Recent Labs  Lab 05/23/19 2128 05/24/19 0125 05/24/19 0800  NA 141  --  142  K 4.4  --  4.4  CL 102  --  106  CO2 27  --  27  GLUCOSE 103*  --  156*  BUN 11  --  12  CREATININE 0.91 0.81 0.73  CALCIUM 9.2  --  8.4*  AST 23  --  21  ALT 14  --  13  ALKPHOS 63  --  57  BILITOT 0.4  --  0.2*   ------------------------------------------------------------------------------------------------------------------ No results for input(s): CHOL, HDL, LDLCALC, TRIG, CHOLHDL, LDLDIRECT in the last 72 hours.  Lab Results  Component Value Date   HGBA1C 5.4 12/24/2018   ------------------------------------------------------------------------------------------------------------------ No results for input(s): TSH, T4TOTAL, T3FREE, THYROIDAB in the last 72 hours.  Invalid input(s): FREET3 ------------------------------------------------------------------------------------------------------------------ Recent Labs    05/24/19 0800  FERRITIN 154    Coagulation profile No results for input(s): INR, PROTIME in the last 168 hours.  Recent Labs    05/24/19 0800  DDIMER 0.44    Cardiac Enzymes Recent Labs  Lab 05/23/19 2128  TROPONINI <0.03   ------------------------------------------------------------------------------------------------------------------     Component Value Date/Time   BNP 12.8 05/23/2019 2128    Inpatient Medications  Scheduled Meds: . aerochamber plus with mask  1 each Other Once  . enoxaparin (LOVENOX) injection  40 mg Subcutaneous Q24H  . methylPREDNISolone (SOLU-MEDROL) injection  40 mg Intravenous Q12H  . vitamin C  500 mg Oral Daily  . zinc sulfate  220 mg Oral Daily   Continuous Infusions: . azithromycin Stopped (05/23/19 2359)  . cefTRIAXone (ROCEPHIN)  IV Stopped (05/24/19 0044)  . [START ON 05/25/2019] remdesivir 100 mg in NS 250 mL     PRN Meds:.acetaminophen **OR** acetaminophen, ondansetron **OR** ondansetron (ZOFRAN) IV  Micro Results Recent Results (from the past 240 hour(s))  Blood Culture (routine x 2)     Status: None (Preliminary result)   Collection Time: 05/23/19  9:23 PM  Result Value Ref Range Status   Specimen Description BLOOD LEFT FOREARM  Final   Special Requests   Final    BOTTLES DRAWN AEROBIC AND ANAEROBIC Blood Culture adequate volume   Culture   Final    NO GROWTH < 12 HOURS Performed at [facility], [address], [place], [place] [address]    Report Status PENDING  Incomplete  Blood Culture (routine x 2)     Status: None (Preliminary result)   Collection Time: 05/23/19  9:28 PM  Result Value Ref Range Status   Specimen Description BLOOD LEFT ARM  Final   Special Requests   Final    BOTTLES DRAWN AEROBIC AND ANAEROBIC Blood Culture adequate volume   Culture   Final    NO GROWTH < 12 HOURS Performed at [facility], [place] [address], [place], [place] [address]    Report Status PENDING  Incomplete  SARS Coronavirus 2 (CEPHEID- Performed in [place] hospital lab), Hosp Order     Status: Abnormal   Collection Time: 05/23/19  9:35 PM  Result Value Ref Range Status   SARS Coronavirus 2 POSITIVE (A) NEGATIVE Final    Comment: RESULT CALLED TO, READ BACK BY AND VERIFIED WITH: B OSORIO RN 05/23/19 2304 JDW (NOTE) If result is NEGATIVE SARS-CoV-2 target nucleic acids  are NOT DETECTED. The SARS-CoV-2 RNA is generally detectable in upper and lower  respiratory specimens during the acute phase of infection. The lowest  concentration of SARS-CoV-2 viral copies this assay can detect is 250  copies / mL. A negative result does not preclude SARS-CoV-2 infection  and should not be used as the sole basis for treatment or other  patient management decisions.  A negative result may occur with  improper specimen collection / handling, submission of specimen other  than nasopharyngeal swab, presence of viral mutation(s) within the  areas targeted by this assay, and inadequate number of viral copies  (<250 copies / mL). A negative result must be combined with clinical  observations, patient history, and epidemiological information. If result is POSITIVE SARS-CoV-2 target nucleic acids are DETECTED. The SARS- CoV-2 RNA  is generally detectable in upper and lower  respiratory specimens during the acute phase of infection.  Positive  results are indicative of active infection with SARS-CoV-2.  Clinical  correlation with patient history and other diagnostic information is  necessary to determine patient infection status.  Positive results do  not rule out bacterial infection or co-infection with other viruses. If result is PRESUMPTIVE POSTIVE SARS-CoV-2 nucleic acids MAY BE PRESENT.   A presumptive positive result was obtained on the submitted specimen  and confirmed on repeat testing.  While 2019 novel coronavirus  (SARS-CoV-2) nucleic acids may be present in the submitted sample  additional confirmatory testing may be necessary for epidemiological  and / or clinical management purposes  to differentiate between  SARS-CoV-2 and other Sarbecovirus currently known to infect humans.  If clinically indicated additional testing with an alternate test  methodology [phone]) is advised . The SARS-CoV-2 RNA is generally  detectable in upper and lower respiratory specimens  during the acute  phase of infection. The expected result is Negative. Fact Sheet for Patients:  StrictlyIdeas.no Fact Sheet for Healthcare Providers: BankingDealers.co.za This test is not yet approved or cleared by the [place] FDA and has been authorized for detection and/or diagnosis of SARS-CoV-2 by FDA under an Emergency Use Authorization (EUA).  This EUA will remain in effect (meaning this test can be used) for the duration of the COVID-19 declaration under Section 564(b)(1) of the Act, 21 U.S.C. section 360bbb-3(b)(1), unless the authorization is terminated or revoked sooner. Performed at [facility], [place] [address], [place], [place] [address]     Radiology Reports Mr Jeri Cos Wo Contrast  Result Date: 05/13/2019  [facility] [address], [place], [place] [address] [phone] NEUROIMAGING REPORT STUDY DATE: 05/11/2019 PATIENT NAME: DALAYA SUPPA DOB: 28-Mar-1966 MRN: [id number] EXAM: MRI Brain with and without contrast ORDERING CLINICIAN: Kathrynn Ducking, MD CLINICAL HISTORY: 53 year old woman with history of left acoustic neuroma, hearing loss and left facial pain COMPARISON FILMS: None available TECHNIQUE:MRI of the brain with and without contrast was obtained utilizing 5 mm axial slices with T1, T2, T2 flair, SWI and diffusion weighted views.  T1 sagittal, T2 coronal and postcontrast views in the axial and coronal plane were obtained.  Additional thin section pre-and postcontrast T1-weighted images through the internal auditory canals and axial CISS images were also obtained CONTRAST: 17 ml [employer] IMAGING SITE: [facility], [place]. FINDINGS: On sagittal images, the spinal cord is imaged caudally to C3-C4 and is normal in caliber.   The contents of the posterior fossa are of normal size and position.   The pituitary gland and optic chiasm appear normal.    Brain volume appears  normal.   The ventricles are normal in size and without distortion.  There are no abnormal extra-axial collections of fluid.  The cerebellum and brainstem appears normal.   The deep gray matter appears normal.  In the hemispheres, there are some scattered T2/flair hyperintense foci in the subcortical and and deep white matter.  None of these appear to be acute.   Diffusion weighted images are normal.  Susceptibility weighted images show a single chronic microhemorrhage in the left striatum. There is a 18 x 10 x 10 mm (transverse, height, AP) homogenously enhancing lesion in the left internal auditory canals consistent with an acoustic schwannoma.  The orbits appear normal.   The mastoid air cells appear normal.  There is a mucous retention cyst in the  left maxillary sinus.  The other paranasal sinuses appear normal.  Flow voids are identified within the major intracerebral arteries.    This MRI of the brain with and without contrast shows the following: 1.  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 2.   There is a normal enhancement pattern elsewhere in the brain. 3.   Mild chronic microvascular ischemic changes.  There were no acute findings. INTERPRETING PHYSICIAN: Richard A. Felecia Shelling, MD, PhD, FAAN Certified in  Neuroimaging by [employer]   Dg Chest [place] 1 View  Result Date: 05/23/2019 CLINICAL DATA:  Dyspnea EXAM: PORTABLE CHEST 1 VIEW COMPARISON:  12/24/2018 FINDINGS: There is a new right lower lobe airspace opacity. Emphysematous changes are noted bilaterally. There is scarring versus atelectasis at the right lung base. The heart size is stable from prior study. IMPRESSION: New right lower lung zone airspace opacity concerning for developing pneumonia. Electronically Signed   By: Constance Holster M.D.   On: 05/23/2019 22:03    Time Spent in minutes : no charge note   Phillips Climes M.D on 05/24/2019 at  11:40 AM  Between 7am to 7pm - Pager - [phone]  After 7pm go to [email or web address] - password [facility]  [facility] -  Office  [phone]

## 2019-05-24 NOTE — ED Notes (Signed)
Pt. Found in bathroom. Pt disconnected self from monitor. Pt with no oxygen, had almost pulled out IV. Iv secured and flushed. Pt. Was not wearing mask. Pt. Aware COVID positive. Pt reminded to wear mask for the safety of others. Pt informed to stay in room, to use call bell for assistance. Call bell at bedside. Bedside commode in room.

## 2019-05-24 NOTE — ED Notes (Signed)
ED TO INPATIENT HANDOFF REPORT  ED Nurse Name and Phone #:  Mariette Cowley   S Name/Age/Gender Madison Phillips 53 y.o. female Room/Bed: 018C/018C  Code Status   Code Status: Prior  Home/SNF/Other Home Patient oriented to: self, place, time and situation Is this baseline? Yes   Triage Complete: Triage complete  Chief Complaint copd,sob  Triage Note Pt c/o increasing shortness of breath and chest heaviness x 2 days, worsening today. Pt speaking in short phrases. SpO2 73% on room air, pt states she is supposed to wear 2L [place], but left it at home. Placed on [place] in triage.   Allergies No Known Allergies  Level of Care/Admitting Diagnosis ED Disposition    ED Disposition Condition [facility] Area: [place] [[id number]]  Level of Care: Progressive [102]  Covid Evaluation: Confirmed COVID Positive  Isolation Risk Level: High Risk/Airborne (Aerosolizing procedure, nebulizer, intubated/ventilation, CPAP/BiPAP)  Diagnosis: Acute respiratory failure with hypoxia [facility]) [[id number]]  Admitting Physician: Rise Patience [phone]  Attending Physician: Rise Patience [phone]  Estimated length of stay: past midnight tomorrow  Certification:: I certify this patient will need inpatient services for at least 2 midnights  PT Class (Do Not Modify): Inpatient [101]  PT Acc Code (Do Not Modify): Private [1]       B Medical/Surgery History Past Medical History:  Diagnosis Date  . COPD (chronic obstructive pulmonary disease) ([place])   . GERD (gastroesophageal reflux disease) 12/25/2018  . 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 Left ear hearing loss 12/05/2018   Past Surgical History:  Procedure Laterality Date  . cyber knife    . ectopic pregnanacy       A IV Location/Drains/Wounds Patient Lines/Drains/Airways Status   Active Line/Drains/Airways    Name:   Placement date:   Placement time:   Site:   Days:   Peripheral IV 05/23/19 Right Antecubital    05/23/19    2231    Antecubital   1   Peripheral IV 05/23/19 Left Antecubital   05/23/19    2346    Antecubital   1          Intake/Output Last 24 hours No intake or output data in the 24 hours ending 05/24/19 0107  Labs/Imaging Results for orders placed or performed during the hospital encounter of 05/23/19 (from the past 48 hour(s))  Comprehensive metabolic panel     Status: Abnormal   Collection Time: 05/23/19  9:28 PM  Result Value Ref Range   Sodium 141 135 - 145 mmol/L   Potassium 4.4 3.5 - 5.1 mmol/L   Chloride 102 98 - 111 mmol/L   CO2 27 22 - 32 mmol/L   Glucose, Bld 103 (H) 70 - 99 mg/dL   BUN 11 6 - 20 mg/dL   Creatinine, Ser 0.91 0.44 - 1.00 mg/dL   Calcium 9.2 8.9 - 10.3 mg/dL   Total Protein 6.8 6.5 - 8.1 g/dL   Albumin 3.8 3.5 - 5.0 g/dL   AST 23 15 - 41 U/L   ALT 14 0 - 44 U/L   Alkaline Phosphatase 63 38 - 126 U/L   Total Bilirubin 0.4 0.3 - 1.2 mg/dL   GFR calc non Af Amer >60 >60 mL/min   GFR calc Af Amer >60 >60 mL/min   Anion gap 12 5 - 15    Comment: Performed at [facility], [address], [place], [place] [address]  CBC WITH DIFFERENTIAL  Status: Abnormal   Collection Time: 05/23/19  9:28 PM  Result Value Ref Range   WBC 6.0 4.0 - 10.5 K/uL   RBC 4.78 3.87 - 5.11 MIL/uL   Hemoglobin 14.8 12.0 - 15.0 g/dL   HCT 45.5 36.0 - 46.0 %   MCV 95.2 80.0 - 100.0 fL   MCH 31.0 26.0 - 34.0 pg   MCHC 32.5 30.0 - 36.0 g/dL   RDW 12.6 11.5 - 15.5 %   Platelets 141 (L) 150 - 400 K/uL    Comment: REPEATED TO VERIFY   nRBC 0.0 0.0 - 0.2 %   Neutrophils Relative % 72 %   Neutro Abs 4.3 1.7 - 7.7 K/uL   Lymphocytes Relative 21 %   Lymphs Abs 1.3 0.7 - 4.0 K/uL   Monocytes Relative 7 %   Monocytes Absolute 0.4 0.1 - 1.0 K/uL   Eosinophils Relative 0 %   Eosinophils Absolute 0.0 0.0 - 0.5 K/uL   Basophils Relative 0 %   Basophils Absolute 0.0 0.0 - 0.1 K/uL   Immature Granulocytes 0 %   Abs Immature Granulocytes 0.01 0.00 - 0.07 K/uL    Comment:  Performed at [facility], [address] [place], [place] [address]  Brain natriuretic peptide     Status: None   Collection Time: 05/23/19  9:28 PM  Result Value Ref Range   B Natriuretic Peptide 12.8 0.0 - 100.0 pg/mL    Comment: Performed at [facility] [address] [place], [place] [address]  Troponin I - ONCE - STAT     Status: None   Collection Time: 05/23/19  9:28 PM  Result Value Ref Range   Troponin I <0.03 <0.03 ng/mL    Comment: Performed at [facility], [place] [address] [place], [place] [address]  SARS Coronavirus 2 (CEPHEID- Performed in [place] hospital lab), Hosp Order     Status: Abnormal   Collection Time: 05/23/19  9:35 PM  Result Value Ref Range   SARS Coronavirus 2 POSITIVE (A) NEGATIVE    Comment: RESULT CALLED TO, READ BACK BY AND VERIFIED WITH: B OSORIO RN 05/23/19 2304 JDW (NOTE) If result is NEGATIVE SARS-CoV-2 target nucleic acids are NOT DETECTED. The SARS-CoV-2 RNA is generally detectable in upper and lower  respiratory specimens during the acute phase of infection. The lowest  concentration of SARS-CoV-2 viral copies this assay can detect is 250  copies / mL. A negative result does not preclude SARS-CoV-2 infection  and should not be used as the sole basis for treatment or other  patient management decisions.  A negative result may occur with  improper specimen collection / handling, submission of specimen other  than nasopharyngeal swab, presence of viral mutation(s) within the  areas targeted by this assay, and inadequate number of viral copies  (<250 copies / mL). A negative result must be combined with clinical  observations, patient history, and epidemiological information. If result is POSITIVE SARS-CoV-2 target nucleic acids are DETECTED. The SARS- CoV-2 RNA is generally detectable in upper and lower  respiratory specimens during the acute phase of infection.  Positive  results are indicative of active infection with  SARS-CoV-2.  Clinical  correlation with patient history and other diagnostic information is  necessary to determine patient infection status.  Positive results do  not rule out bacterial infection or co-infection with other viruses. If result is PRESUMPTIVE POSTIVE SARS-CoV-2 nucleic acids MAY BE PRESENT.   A presumptive positive result was  obtained on the submitted specimen  and confirmed on repeat testing.  While 2019 novel coronavirus  (SARS-CoV-2) nucleic acids may be present in the submitted sample  additional confirmatory testing may be necessary for epidemiological  and / or clinical management purposes  to differentiate between  SARS-CoV-2 and other Sarbecovirus currently known to infect humans.  If clinically indicated additional testing with an alternate test  methodology [phone]) is advised . The SARS-CoV-2 RNA is generally  detectable in upper and lower respiratory specimens during the acute  phase of infection. The expected result is Negative. Fact Sheet for Patients:  StrictlyIdeas.no Fact Sheet for Healthcare Providers: BankingDealers.co.za This test is not yet approved or cleared by the [place] FDA and has been authorized for detection and/or diagnosis of SARS-CoV-2 by FDA under an Emergency Use Authorization (EUA).  This EUA will remain in effect (meaning this test can be used) for the duration of the COVID-19 declaration under Section 564(b)(1) of the Act, 21 U.S.C. section 360bbb-3(b)(1), unless the authorization is terminated or revoked sooner. Performed at [facility], [place] [address], [place], [place] [address]   CBG monitoring, ED     Status: Abnormal   Collection Time: 05/23/19  9:46 PM  Result Value Ref Range   Glucose-Capillary 104 (H) 70 - 99 mg/dL   Comment 1 Notify RN    Comment 2 Document in Chart   Lactic acid, plasma     Status: None   Collection Time: 05/23/19 10:03 PM  Result Value Ref  Range   Lactic Acid, Venous 0.7 0.5 - 1.9 mmol/L    Comment: Performed at [facility] [address], [place], [place] [address]   Dg Chest Port 1 View  Result Date: 05/23/2019 CLINICAL DATA:  Dyspnea EXAM: PORTABLE CHEST 1 VIEW COMPARISON:  12/24/2018 FINDINGS: There is a new right lower lobe airspace opacity. Emphysematous changes are noted bilaterally. There is scarring versus atelectasis at the right lung base. The heart size is stable from prior study. IMPRESSION: New right lower lung zone airspace opacity concerning for developing pneumonia. Electronically Signed   By: Constance Holster M.D.   On: 05/23/2019 22:03    Pending Labs Unresulted Labs (From admission, onward)    Start     Ordered   05/23/19 2123  Lactic acid, plasma  Now then every 2 hours,   STAT     05/23/19 2123   05/23/19 2123  Blood Culture (routine x 2)  BLOOD CULTURE X 2,   STAT     05/23/19 2123   05/23/19 2123  Urinalysis, Routine w reflex microscopic  ONCE - STAT,   STAT     05/23/19 2123          Vitals/Pain Today's Vitals   05/23/19 2345 05/23/19 2345 05/24/19 0000 05/24/19 0015  BP: 126/81  (!) 122/97 (!) 128/91  Pulse: (!) 111  (!) 108 (!) 106  Resp: 16  (!) 25 19  Temp:      TempSrc:      SpO2: 94%  (!) 81% 90%  Weight:      Height:      PainSc:  2       Isolation Precautions Droplet and Contact precautions  Medications Medications  aerochamber plus with mask device 1 each (1 each Other Not Given 05/23/19 2310)  cefTRIAXone (ROCEPHIN) 2 g in sodium chloride 0.9 % 100 mL IVPB (0 g Intravenous Stopped 05/24/19 0044)  azithromycin (ZITHROMAX) 500 mg in sodium chloride 0.9 %  250 mL IVPB (0 mg Intravenous Stopped 05/23/19 2359)  albuterol (VENTOLIN HFA) 108 (90 Base) MCG/ACT inhaler 8 puff (8 puffs Inhalation Given 05/23/19 2226)  ipratropium (ATROVENT HFA) inhaler 2 puff (2 puffs Inhalation Given 05/23/19 2230)  lactated ringers bolus 1,000 mL (0 mLs Intravenous Stopped 05/24/19 0044)   acetaminophen (TYLENOL) tablet 650 mg (650 mg Oral Given 05/23/19 2226)  methylPREDNISolone sodium succinate (SOLU-MEDROL) 125 mg/2 mL injection 125 mg (125 mg Intravenous Given 05/23/19 2227)  magnesium sulfate IVPB 2 g 50 mL (0 g Intravenous Stopped 05/23/19 2359)    Mobility walks Low fall risk   Focused Assessments Pulmonary Assessment Handoff:  Lung sounds: Bilateral Breath Sounds: Diminished L Breath Sounds: Diminished R Breath Sounds: Diminished O2 Device: Nasal Cannula O2 Flow Rate (L/min): 4 L/min      R Recommendations: See Admitting Provider Note  Report given to:   Additional Notes:

## 2019-05-24 NOTE — ED Notes (Signed)
Pt. Transported to [facility] with [place]

## 2019-05-24 NOTE — ED Notes (Addendum)
Pt once again found without O2, spO2 monitor, mask, and BP. Pt. Reconnected and informed of importance of keeping items on

## 2019-05-24 NOTE — H&P (Signed)
History and Physical    Madison Phillips BMW:[id number] DOB: 02/04/1966 DOA: 05/23/2019  PCP: [name], MD  Patient coming from: Home.  Chief Complaint: Shortness of breath.  HPI: Madison Phillips is a 53 y.o. female with history of COPD caustic neuroma presents to the ER because of increasing shortness of breath with some chest tightness x2 days.  Also feeling increasingly dizzy.  Dizziness is present even at rest increased on moving.  Denies any focal deficit headache nausea vomiting abdominal pain.  ED Course: In the ER patient was febrile with temperature of 100.5 F heart rate was around 123 bpm EKG showing sinus tachycardia chest x-ray showing infiltrates and patient was hypoxic initially requiring 5 L oxygen to maintain sats.  Blood cultures obtained started on empiric antibiotics for community-acquired pneumonia.  While in the ER patient had episode of diarrhea.  Patient's COVID-19 test came positive and is being admitted for acute respiratory failure with COVID pneumonia.  Review of Systems: As per HPI, rest all negative.   Past Medical History:  Diagnosis Date  . COPD (chronic obstructive pulmonary disease) ([place])   . GERD (gastroesophageal reflux disease) 12/25/2018  . 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 Left ear hearing loss 12/05/2018    Past Surgical History:  Procedure Laterality Date  . cyber knife    . ectopic pregnanacy       reports that she has quit smoking. She has never used smokeless tobacco. She reports current alcohol use of about 2.0 - 3.0 standard drinks of alcohol per week. She reports previous drug use.  No Known Allergies  Family History  Problem Relation Age of Onset  . Hypothyroidism Mother   . Diabetes Mellitus II Maternal Aunt   . Diabetes Maternal Aunt   . Cancer Father     Prior to Admission medications   Medication Sig Start Date End Date Taking? Authorizing Provider  ibuprofen (ADVIL) 800 MG tablet Take 1 tablet (800 mg total) by  mouth every 8 (eight) hours as needed. 05/21/19  Yes Kathrynn Ducking, MD  ipratropium-albuterol (DUONEB) 0.5-2.5 (3) MG/3ML SOLN Use 4 times daily on schedule Patient taking differently: Take 3 mLs by nebulization 4 (four) times daily.  03/26/19  Yes Elsie Stain, MD  albuterol (PROVENTIL) (2.5 MG/3ML) 0.083% nebulizer solution Take 3 mLs (2.5 mg total) by nebulization every 6 (six) hours as needed for wheezing or shortness of breath. Patient not taking: Reported on 05/23/2019 03/26/19 03/25/20  Elsie Stain, MD  carbamazepine (TEGRETOL) 200 MG tablet 1/2 tablet twice daily for 2 weeks then take 1 full tablet twice daily Patient not taking: Reported on 05/23/2019 05/21/19   Kathrynn Ducking, MD  Fluticasone-Umeclidin-Vilant (TRELEGY ELLIPTA) 100-62.5-25 MCG/INH AEPB Inhale 1 puff into the lungs daily. Patient not taking: Reported on 04/19/2019 04/12/19   Elsie Stain, MD    Physical Exam: Vitals:   05/23/19 2335 05/23/19 2345 05/24/19 0000 05/24/19 0015  BP: 130/85 126/81 (!) 122/97 (!) 128/91  Pulse: (!) 122 (!) 111 (!) 108 (!) 106  Resp: 17 16 (!) 25 19  Temp:      TempSrc:      SpO2: (!) 88% 94% (!) 81% 90%  Weight:      Height:          Constitutional: Moderately built and nourished. Vitals:   05/23/19 2335 05/23/19 2345 05/24/19 0000 05/24/19 0015  BP: 130/85 126/81 (!) 122/97 (!) 128/91  Pulse: (!) 122 (!) 111 (!) 108 [place])  106  Resp: 17 16 (!) 25 19  Temp:      TempSrc:      SpO2: (!) 88% 94% (!) 81% 90%  Weight:      Height:       Eyes: Anicteric no pallor. ENMT: No discharge from the ears eyes nose and mouth. Neck: No mass felt.  No neck rigidity. Respiratory: No rhonchi or crepitations. Cardiovascular: S1-S2 heard. Abdomen: Soft nontender bowel sounds present. Musculoskeletal: No edema. Skin: No rash. Neurologic: Alert awake oriented to time place and person.  Moves all extremities. Psychiatric: Appears normal.  Normal affect.   Labs on Admission: I have  personally reviewed following labs and imaging studies  CBC: Recent Labs  Lab 05/23/19 2128  WBC 6.0  NEUTROABS 4.3  HGB 14.8  HCT 45.5  MCV 95.2  PLT 035*   Basic Metabolic Panel: Recent Labs  Lab 05/23/19 2128  NA 141  K 4.4  CL 102  CO2 27  GLUCOSE 103*  BUN 11  CREATININE 0.91  CALCIUM 9.2   GFR: Estimated Creatinine Clearance: 76.4 mL/min (by C-G formula based on SCr of 0.91 mg/dL). Liver Function Tests: Recent Labs  Lab 05/23/19 2128  AST 23  ALT 14  ALKPHOS 63  BILITOT 0.4  PROT 6.8  ALBUMIN 3.8   No results for input(s): LIPASE, AMYLASE in the last 168 hours. No results for input(s): AMMONIA in the last 168 hours. Coagulation Profile: No results for input(s): INR, PROTIME in the last 168 hours. Cardiac Enzymes: Recent Labs  Lab 05/23/19 2128  TROPONINI <0.03   BNP (last 3 results) No results for input(s): PROBNP in the last 8760 hours. HbA1C: No results for input(s): HGBA1C in the last 72 hours. CBG: Recent Labs  Lab 05/23/19 2146  GLUCAP 104*   Lipid Profile: No results for input(s): CHOL, HDL, LDLCALC, TRIG, CHOLHDL, LDLDIRECT in the last 72 hours. Thyroid Function Tests: No results for input(s): TSH, T4TOTAL, FREET4, T3FREE, THYROIDAB in the last 72 hours. Anemia Panel: No results for input(s): VITAMINB12, FOLATE, FERRITIN, TIBC, IRON, RETICCTPCT in the last 72 hours. Urine analysis: No results found for: COLORURINE, APPEARANCEUR, LABSPEC, PHURINE, GLUCOSEU, HGBUR, BILIRUBINUR, KETONESUR, PROTEINUR, UROBILINOGEN, NITRITE, LEUKOCYTESUR Sepsis Labs: @LABRCNTIP (procalcitonin:4,lacticidven:4) ) Recent Results (from the past 240 hour(s))  SARS Coronavirus 2 (CEPHEID- Performed in [place] hospital lab), Hosp Order     Status: Abnormal   Collection Time: 05/23/19  9:35 PM  Result Value Ref Range Status   SARS Coronavirus 2 POSITIVE (A) NEGATIVE Final    Comment: RESULT CALLED TO, READ BACK BY AND VERIFIED WITH: B OSORIO RN 05/23/19  2304 JDW (NOTE) If result is NEGATIVE SARS-CoV-2 target nucleic acids are NOT DETECTED. The SARS-CoV-2 RNA is generally detectable in upper and lower  respiratory specimens during the acute phase of infection. The lowest  concentration of SARS-CoV-2 viral copies this assay can detect is 250  copies / mL. A negative result does not preclude SARS-CoV-2 infection  and should not be used as the sole basis for treatment or other  patient management decisions.  A negative result may occur with  improper specimen collection / handling, submission of specimen other  than nasopharyngeal swab, presence of viral mutation(s) within the  areas targeted by this assay, and inadequate number of viral copies  (<250 copies / mL). A negative result must be combined with clinical  observations, patient history, and epidemiological information. If result is POSITIVE SARS-CoV-2 target nucleic acids are DETECTED. The SARS- CoV-2 RNA is  generally detectable in upper and lower  respiratory specimens during the acute phase of infection.  Positive  results are indicative of active infection with SARS-CoV-2.  Clinical  correlation with patient history and other diagnostic information is  necessary to determine patient infection status.  Positive results do  not rule out bacterial infection or co-infection with other viruses. If result is PRESUMPTIVE POSTIVE SARS-CoV-2 nucleic acids MAY BE PRESENT.   A presumptive positive result was obtained on the submitted specimen  and confirmed on repeat testing.  While 2019 novel coronavirus  (SARS-CoV-2) nucleic acids may be present in the submitted sample  additional confirmatory testing may be necessary for epidemiological  and / or clinical management purposes  to differentiate between  SARS-CoV-2 and other Sarbecovirus currently known to infect humans.  If clinically indicated additional testing with an alternate test  methodology [phone]) is advised . The  SARS-CoV-2 RNA is generally  detectable in upper and lower respiratory specimens during the acute  phase of infection. The expected result is Negative. Fact Sheet for Patients:  StrictlyIdeas.no Fact Sheet for Healthcare Providers: BankingDealers.co.za This test is not yet approved or cleared by the [place] FDA and has been authorized for detection and/or diagnosis of SARS-CoV-2 by FDA under an Emergency Use Authorization (EUA).  This EUA will remain in effect (meaning this test can be used) for the duration of the COVID-19 declaration under Section 564(b)(1) of the Act, 21 U.S.C. section 360bbb-3(b)(1), unless the authorization is terminated or revoked sooner. Performed at [facility], [place] [address] [place], [place] [address]      Radiological Exams on Admission: Dg Chest Port 1 View  Result Date: 05/23/2019 CLINICAL DATA:  Dyspnea EXAM: PORTABLE CHEST 1 VIEW COMPARISON:  12/24/2018 FINDINGS: There is a new right lower lobe airspace opacity. Emphysematous changes are noted bilaterally. There is scarring versus atelectasis at the right lung base. The heart size is stable from prior study. IMPRESSION: New right lower lung zone airspace opacity concerning for developing pneumonia. Electronically Signed   By: Constance Holster M.D.   On: 05/23/2019 22:03    EKG: Independently reviewed.  Sinus tachycardia.  Assessment/Plan Principal Problem:   Acute respiratory failure with hypoxia (HCC) Active Problems:   History of acoustic neuroma   COPD with emphysema ([place])   Pneumonia due to COVID-19 virus    1. Acute respiratory failure with hypoxia secondary to COVID pneumonia -patient has been placed on empiric antibiotics for community-acquired pneumonia which can be discontinued if procalcitonin was negative.  Patient also was given Solu-Medrol for COPD which will be continued.  Discussed with pharmacy about starting Remdesvir. 2.  COPD on exam patient not actively wheezing will continue albuterol inhaler and if patient is on steroids. 3. Dizziness could be from hypoxia.  Patient does have a history of a carcinoma being followed by Dr. Jannifer Franklin neurologist has had recent MRI 10 days ago.  Appears nonfocal. 4. Diarrhea likely from COVID. 5. Chest tightness likely from respiratory failure with secondary to COPD and hypoxia.  Will check troponin.   DVT prophylaxis: Lovenox. Code Status: Full code. Family Communication: Discussed with patient. Disposition Plan: Home. Consults called: None. Admission status: Inpatient.   Rise Patience MD [facility] Pager [phone].  If 7PM-7AM, please contact night-coverage [email or web address] Password [email or web address]  05/24/2019, 1:10 AM

## 2019-05-24 NOTE — ED Notes (Addendum)
Pt. Constantly reminded to keep oxygen on, SpO2 monitor and mask on. Pt has been uncomplying.   Pt. De-saturates to low 80s. O2 constantly replaced.

## 2019-05-25 DIAGNOSIS — J432 Centrilobular emphysema: Secondary | ICD-10-CM

## 2019-05-25 DIAGNOSIS — Z86018 Personal history of other benign neoplasm: Secondary | ICD-10-CM

## 2019-05-25 LAB — COMPREHENSIVE METABOLIC PANEL
ALT: 15 U/L (ref 0–44)
AST: 20 U/L (ref 15–41)
Albumin: 3.7 g/dL (ref 3.5–5.0)
Alkaline Phosphatase: 54 U/L (ref 38–126)
Anion gap: 6 (ref 5–15)
BUN: 16 mg/dL (ref 6–20)
CO2: 32 mmol/L (ref 22–32)
Calcium: 8.7 mg/dL — ABNORMAL LOW (ref 8.9–10.3)
Chloride: 105 mmol/L (ref 98–111)
Creatinine, Ser: 0.69 mg/dL (ref 0.44–1.00)
GFR calc Af Amer: >60 mL/min (ref >60)
GFR calc non Af Amer: >60 mL/min (ref >60)
Glucose, Bld: 119 mg/dL — ABNORMAL HIGH (ref 70–99)
Potassium: 4.6 mmol/L (ref 3.5–5.1)
Sodium: 143 mmol/L (ref 135–145)
Total Bilirubin: 0.4 mg/dL (ref 0.3–1.2)
Total Protein: 6.9 g/dL (ref 6.5–8.1)

## 2019-05-25 LAB — C-REACTIVE PROTEIN: CRP: 4.9 mg/dL — ABNORMAL HIGH (ref <1.0)

## 2019-05-25 LAB — CBC
HCT: 44.5 % (ref 36.0–46.0)
Hemoglobin: 13.9 g/dL (ref 12.0–15.0)
MCH: 30.5 pg (ref 26.0–34.0)
MCHC: 31.2 g/dL (ref 30.0–36.0)
MCV: 97.6 fL (ref 80.0–100.0)
Platelets: 167 10*3/uL (ref 150–400)
RBC: 4.56 MIL/uL (ref 3.87–5.11)
RDW: 12.7 % (ref 11.5–15.5)
WBC: 8.7 10*3/uL (ref 4.0–10.5)
nRBC: 0 % (ref 0.0–0.2)

## 2019-05-25 LAB — D-DIMER, QUANTITATIVE: D-Dimer, Quant: 0.6 ug/mL-FEU — ABNORMAL HIGH (ref 0.00–0.50)

## 2019-05-25 LAB — FERRITIN: Ferritin: 189 ng/mL (ref 11–307)

## 2019-05-25 LAB — PROCALCITONIN: Procalcitonin: <0.1 ng/mL

## 2019-05-25 MED ORDER — IPRATROPIUM-ALBUTEROL 20-100 MCG/ACT IN AERS
1.0000 | INHALATION_SPRAY | Freq: Four times a day (QID) | RESPIRATORY_TRACT | Status: DC
  Administered 2019-05-25 – 2019-05-26 (×4): 1 via RESPIRATORY_TRACT
  Filled 2019-05-25: qty 4

## 2019-05-25 MED ORDER — SALINE SPRAY 0.65 % NA SOLN
1.0000 | NASAL | Status: DC | PRN
  Filled 2019-05-25: qty 44

## 2019-05-25 NOTE — Progress Notes (Signed)
CardioVascular Research Department and AHF Team  ReDS Research Project   Patient #: [id number]  ReDS Measurement  Right: 29 %  Left: 25 %

## 2019-05-25 NOTE — Plan of Care (Signed)
  Problem: Health Behavior/Discharge Planning: Goal: Ability to manage health-related needs will improve Outcome: Progressing   Problem: Clinical Measurements: Goal: Ability to maintain clinical measurements within normal limits will improve Outcome: Progressing Goal: Respiratory complications will improve Outcome: Progressing Goal: Cardiovascular complication will be avoided Outcome: Progressing   Problem: Coping: Goal: Level of anxiety will decrease Outcome: Progressing   Problem: Education: Goal: Knowledge of risk factors and measures for prevention of condition will improve Outcome: Progressing   Problem: Coping: Goal: Psychosocial and spiritual needs will be supported Outcome: Progressing   Problem: Respiratory: Goal: Will maintain a patent airway Outcome: Progressing Goal: Complications related to the disease process, condition or treatment will be avoided or minimized Outcome: Progressing   Problem: Clinical Measurements: Goal: Diagnostic test results will improve Outcome: Not Progressing Note:  Some inflammatory markers remain elevated

## 2019-05-25 NOTE — Progress Notes (Signed)
0820  Sitting up in bed eating breakfast.  O2 at 2l/[place].  O2 sat 92%.  Incentive spirometry given.  Repeat demonstration satisfactory.

## 2019-05-25 NOTE — Progress Notes (Signed)
PROGRESS NOTE                                                                                                                                                                                                             Patient Demographics:    Madison Phillips, is a 53 y.o. female, DOB - 26-Feb-1966, BHA:[id number]  Admit date - 05/23/2019   Admitting Physician Rise Patience, MD  Outpatient Primary MD for the patient is [name], MD  LOS - 1   Chief Complaint  Patient presents with  . Shortness of Breath       Brief Narrative    53 y.o. female with history of COPD ,Acaustic neuroma presents to the ER because of increasing shortness of breath with some chest tightness x2 days.  On some dizziness,(dizziness is related to her history of left acoustic schwannoma), he was noted to be hypoxic, requiring 2 L nasal cannula, she tested positive for COVID-19, transferred to [facility] with admission for further management.   Subjective:    Madison Phillips today reports no further dizziness, dyspnea has improved, reports cough, nonproductive    Assessment  & Plan :    Principal Problem:   Acute respiratory failure with hypoxia ([place]) Active Problems:   History of acoustic neuroma   COPD with emphysema ([place])   Pneumonia due to COVID-19 virus  Acute respiratory failure with hypoxia secondary to COVID pneumonia  -patient has been placed on empiric antibiotics for community-acquired pneumonia, procalcitonin within normal limit, this is viral pneumonia, no indication for antibiotics, discontinued -Started on IV Solu-Medrol 05/24/2019 -Started on Remdesivir 05/24/2019 -She remains on 2 L nasal cannula this morning, will hold on giving Actemra. -Continue to trend inflammatory markers, CRP trending down, but D-dimers mildly elevated today, continue to monitor -Continue with vitamin C and zinc  COVID-19 Labs  Recent Labs    05/24/19 0800 05/25/19 0500  DDIMER  0.44 0.60*  FERRITIN 154 189  CRP 8.5* 4.9*    Lab Results  Component Value Date   SARSCOV2NAA POSITIVE (A) 05/23/2019      COPD -No active wheezing, continue Combivent inhaler.   Dizziness -Resolved, no recurrence, she is following with neurology as an outpatient regarding acoustic schwannoma. - followed by Dr. Jannifer Franklin neurologist has had recent MRI 10 days ago.  Appears nonfocal.  Diarrhea  -  likely from [place]. -Resolved  Chest tightness likely from respiratory failure with secondary to COPD and hypoxia.    Normal troponins, resolved     Code Status : Full  Family Communication  : D/W patient  Disposition Plan  : Home when stable  Consults  :  None  Procedures  : None  DVT Prophylaxis  :  Lovenox  Lab Results  Component Value Date   PLT 167 05/25/2019    Antibiotics  :   Anti-infectives (From admission, onward)   Start     Dose/Rate Route Frequency Ordered Stop   05/25/19 0800  remdesivir 100 mg in sodium chloride 0.9 % 230 mL IVPB     100 mg 500 mL/hr over 30 Minutes Intravenous Every 24 hours 05/24/19 0355 05/29/19 0759   05/24/19 0430  remdesivir 200 mg in sodium chloride 0.9 % 210 mL IVPB     200 mg 500 mL/hr over 30 Minutes Intravenous Once 05/24/19 0355 05/24/19 0526   05/23/19 2230  cefTRIAXone (ROCEPHIN) 2 g in sodium chloride 0.9 % 100 mL IVPB  Status:  Discontinued     2 g 200 mL/hr over 30 Minutes Intravenous Every 24 hours 05/23/19 2219 05/24/19 1153   05/23/19 2230  azithromycin (ZITHROMAX) 500 mg in sodium chloride 0.9 % 250 mL IVPB  Status:  Discontinued     500 mg 250 mL/hr over 60 Minutes Intravenous Every 24 hours 05/23/19 2219 05/24/19 1153        Objective:   Vitals:   05/24/19 2121 05/25/19 0008 05/25/19 0405 05/25/19 0800  BP: 103/72 132/83 (!) 131/92   Pulse: 78 76 92 81  Resp: 20 (!) 23 (!) 26 (!) 23  Temp: 98 F (36.7 C) 98.1 F (36.7 C) 97.7 F (36.5 C)   TempSrc: Oral Oral Oral   SpO2: 92% 91% 90% 93%  Weight:       Height:        Wt Readings from Last 3 Encounters:  05/23/19 77.1 kg  02/03/19 79.4 kg  01/30/19 80.7 kg     Intake/Output Summary (Last 24 hours) at 05/25/2019 1148 Last data filed at 05/25/2019 0405 Gross per 24 hour  Intake 420 ml  Output -  Net 420 ml     Physical Exam  Awake Alert, Oriented X 3, No new F.N deficits, Normal affect Symmetrical Chest wall movement, Good air movement bilaterally, CTAB RRR,No Gallops,Rubs or new Murmurs, No Parasternal Heave +ve B.Sounds, Abd Soft, No tenderness, No rebound - guarding or rigidity. No Cyanosis, Clubbing or edema, No new Rash or bruise       Data Review:    CBC Recent Labs  Lab 05/23/19 2128 05/24/19 0125 05/25/19 0500  WBC 6.0 6.1 8.7  HGB 14.8 13.6 13.9  HCT 45.5 42.6 44.5  PLT 141* 133* 167  MCV 95.2 95.5 97.6  MCH 31.0 30.5 30.5  MCHC 32.5 31.9 31.2  RDW 12.6 12.8 12.7  LYMPHSABS 1.3  --   --   MONOABS 0.4  --   --   EOSABS 0.0  --   --   BASOSABS 0.0  --   --     Chemistries  Recent Labs  Lab 05/23/19 2128 05/24/19 0125 05/24/19 0800 05/25/19 0500  NA 141  --  142 143  K 4.4  --  4.4 4.6  CL 102  --  106 105  CO2 27  --  27 32  GLUCOSE 103*  --  156* 119*  BUN 11  --  12 16  CREATININE 0.91 0.81 0.73 0.69  CALCIUM 9.2  --  8.4* 8.7*  AST 23  --  21 20  ALT 14  --  13 15  ALKPHOS 63  --  57 54  BILITOT 0.4  --  0.2* 0.4   ------------------------------------------------------------------------------------------------------------------ No results for input(s): CHOL, HDL, LDLCALC, TRIG, CHOLHDL, LDLDIRECT in the last 72 hours.  Lab Results  Component Value Date   HGBA1C 5.4 12/24/2018   ------------------------------------------------------------------------------------------------------------------ No results for input(s): TSH, T4TOTAL, T3FREE, THYROIDAB in the last 72 hours.  Invalid input(s): FREET3  ------------------------------------------------------------------------------------------------------------------ Recent Labs    05/24/19 0800 05/25/19 0500  FERRITIN 154 189    Coagulation profile No results for input(s): INR, PROTIME in the last 168 hours.  Recent Labs    05/24/19 0800 05/25/19 0500  DDIMER 0.44 0.60*    Cardiac Enzymes Recent Labs  Lab 05/23/19 2128  TROPONINI <0.03   ------------------------------------------------------------------------------------------------------------------    Component Value Date/Time   BNP 12.8 05/23/2019 2128    Inpatient Medications  Scheduled Meds: . aerochamber plus with mask  1 each Other Once  . enoxaparin (LOVENOX) injection  40 mg Subcutaneous Q24H  . famotidine  20 mg Oral BID  . methylPREDNISolone (SOLU-MEDROL) injection  40 mg Intravenous Q8H  . vitamin C  500 mg Oral Daily  . zinc sulfate  220 mg Oral Daily   Continuous Infusions: . remdesivir 100 mg in NS 250 mL 100 mg (05/25/19 0932)   PRN Meds:.acetaminophen **OR** acetaminophen, ondansetron **OR** ondansetron (ZOFRAN) IV  Micro Results Recent Results (from the past 240 hour(s))  Blood Culture (routine x 2)     Status: None (Preliminary result)   Collection Time: 05/23/19  9:23 PM  Result Value Ref Range Status   Specimen Description BLOOD LEFT FOREARM  Final   Special Requests   Final    BOTTLES DRAWN AEROBIC AND ANAEROBIC Blood Culture adequate volume   Culture   Final    NO GROWTH 2 DAYS Performed at [facility], [address] [place], [place] [address]    Report Status PENDING  Incomplete  Blood Culture (routine x 2)     Status: None (Preliminary result)   Collection Time: 05/23/19  9:28 PM  Result Value Ref Range Status   Specimen Description BLOOD LEFT ARM  Final   Special Requests   Final    BOTTLES DRAWN AEROBIC AND ANAEROBIC Blood Culture adequate volume   Culture   Final    NO GROWTH 2 DAYS Performed at [facility], [place] [address] [place], [place] [address]    Report Status PENDING  Incomplete  SARS Coronavirus 2 (CEPHEID- Performed in [place] hospital lab), Hosp Order     Status: Abnormal   Collection Time: 05/23/19  9:35 PM  Result Value Ref Range Status   SARS Coronavirus 2 POSITIVE (A) NEGATIVE Final    Comment: RESULT CALLED TO, READ BACK BY AND VERIFIED WITH: B OSORIO RN 05/23/19 2304 JDW (NOTE) If result is NEGATIVE SARS-CoV-2 target nucleic acids are NOT DETECTED. The SARS-CoV-2 RNA is generally detectable in upper and lower  respiratory specimens during the acute phase of infection. The lowest  concentration of SARS-CoV-2 viral copies this assay can detect is 250  copies / mL. A negative result does not preclude SARS-CoV-2 infection  and should not be used as the sole basis for treatment or other  patient management decisions.  A negative result may occur with  improper specimen  collection / handling, submission of specimen other  than nasopharyngeal swab, presence of viral mutation(s) within the  areas targeted by this assay, and inadequate number of viral copies  (<250 copies / mL). A negative result must be combined with clinical  observations, patient history, and epidemiological information. If result is POSITIVE SARS-CoV-2 target nucleic acids are DETECTED. The SARS- CoV-2 RNA is generally detectable in upper and lower  respiratory specimens during the acute phase of infection.  Positive  results are indicative of active infection with SARS-CoV-2.  Clinical  correlation with patient history and other diagnostic information is  necessary to determine patient infection status.  Positive results do  not rule out bacterial infection or co-infection with other viruses. If result is PRESUMPTIVE POSTIVE SARS-CoV-2 nucleic acids MAY BE PRESENT.   A presumptive positive result was obtained on the submitted specimen  and confirmed on repeat testing.  While 2019 novel  coronavirus  (SARS-CoV-2) nucleic acids may be present in the submitted sample  additional confirmatory testing may be necessary for epidemiological  and / or clinical management purposes  to differentiate between  SARS-CoV-2 and other Sarbecovirus currently known to infect humans.  If clinically indicated additional testing with an alternate test  methodology [phone]) is advised . The SARS-CoV-2 RNA is generally  detectable in upper and lower respiratory specimens during the acute  phase of infection. The expected result is Negative. Fact Sheet for Patients:  StrictlyIdeas.no Fact Sheet for Healthcare Providers: BankingDealers.co.za This test is not yet approved or cleared by the [place] FDA and has been authorized for detection and/or diagnosis of SARS-CoV-2 by FDA under an Emergency Use Authorization (EUA).  This EUA will remain in effect (meaning this test can be used) for the duration of the COVID-19 declaration under Section 564(b)(1) of the Act, 21 U.S.C. section 360bbb-3(b)(1), unless the authorization is terminated or revoked sooner. Performed at [facility], [place] [address], [place], [place] [address]     Radiology Reports Mr Jeri Cos Wo Contrast  Result Date: 05/13/2019  [facility] [address], [place], [place] [address] [id number] NEUROIMAGING REPORT STUDY DATE: 05/11/2019 PATIENT NAME: LOCHLYN ZULLO DOB: 1966-07-16 MRN: [id number] EXAM: MRI Brain with and without contrast ORDERING CLINICIAN: Kathrynn Ducking, MD CLINICAL HISTORY: 53 year old woman with history of left acoustic neuroma, hearing loss and left facial pain COMPARISON FILMS: None available TECHNIQUE:MRI of the brain with and without contrast was obtained utilizing 5 mm axial slices with T1, T2, T2 flair, SWI and diffusion weighted views.  T1 sagittal, T2 coronal and postcontrast views in the axial and coronal plane were  obtained.  Additional thin section pre-and postcontrast T1-weighted images through the internal auditory canals and axial CISS images were also obtained CONTRAST: 17 ml [facility] IMAGING SITE: [facility], [place]. FINDINGS: On sagittal images, the spinal cord is imaged caudally to C3-C4 and is normal in caliber.   The contents of the posterior fossa are of normal size and position.   The pituitary gland and optic chiasm appear normal.    Brain volume appears normal.   The ventricles are normal in size and without distortion.  There are no abnormal extra-axial collections of fluid.  The cerebellum and brainstem appears normal.   The deep gray matter appears normal.  In the hemispheres, there are some scattered T2/flair hyperintense foci in the subcortical and and deep white matter.  None of these appear to be acute.   Diffusion weighted images  are normal.  Susceptibility weighted images show a single chronic microhemorrhage in the left striatum. There is a 18 x 10 x 10 mm (transverse, height, AP) homogenously enhancing lesion in the left internal auditory canals consistent with an acoustic schwannoma.  The orbits appear normal.   The mastoid air cells appear normal.  There is a mucous retention cyst in the left maxillary sinus.  The other paranasal sinuses appear normal.  Flow voids are identified within the major intracerebral arteries.    This MRI of the brain with and without contrast shows the following: 1.  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 2.   There is a normal enhancement pattern elsewhere in the brain. 3.   Mild chronic microvascular ischemic changes.  There were no acute findings. INTERPRETING PHYSICIAN: Richard A. Felecia Shelling, MD, PhD, FAAN Certified in  Neuroimaging by [employer]   Dg Chest O'Brien 1 View  Result Date: 05/23/2019 CLINICAL DATA:  Dyspnea EXAM: PORTABLE CHEST 1 VIEW  COMPARISON:  12/24/2018 FINDINGS: There is a new right lower lobe airspace opacity. Emphysematous changes are noted bilaterally. There is scarring versus atelectasis at the right lung base. The heart size is stable from prior study. IMPRESSION: New right lower lung zone airspace opacity concerning for developing pneumonia. Electronically Signed   By: Constance Holster M.D.   On: 05/23/2019 22:03    Time Spent in minutes : 25 minutes   Phillips Climes M.D on 05/25/2019 at 11:48 AM  Between 7am to 7pm - Pager - [phone]  After 7pm go to [email or web address] - password [facility]  [facility] -  Office  [phone]

## 2019-05-26 LAB — CBC
HCT: 42.6 % (ref 36.0–46.0)
Hemoglobin: 13.7 g/dL (ref 12.0–15.0)
MCH: 31.3 pg (ref 26.0–34.0)
MCHC: 32.2 g/dL (ref 30.0–36.0)
MCV: 97.3 fL (ref 80.0–100.0)
Platelets: 194 10*3/uL (ref 150–400)
RBC: 4.38 MIL/uL (ref 3.87–5.11)
RDW: 12.8 % (ref 11.5–15.5)
WBC: 12.3 10*3/uL — ABNORMAL HIGH (ref 4.0–10.5)
nRBC: 0 % (ref 0.0–0.2)

## 2019-05-26 LAB — COMPREHENSIVE METABOLIC PANEL
ALT: 15 U/L (ref 0–44)
AST: 22 U/L (ref 15–41)
Albumin: 3.6 g/dL (ref 3.5–5.0)
Alkaline Phosphatase: 56 U/L (ref 38–126)
Anion gap: 6 (ref 5–15)
BUN: 20 mg/dL (ref 6–20)
CO2: 29 mmol/L (ref 22–32)
Calcium: 8.4 mg/dL — ABNORMAL LOW (ref 8.9–10.3)
Chloride: 105 mmol/L (ref 98–111)
Creatinine, Ser: 0.67 mg/dL (ref 0.44–1.00)
GFR calc Af Amer: >60 mL/min (ref >60)
GFR calc non Af Amer: >60 mL/min (ref >60)
Glucose, Bld: 113 mg/dL — ABNORMAL HIGH (ref 70–99)
Potassium: 4.4 mmol/L (ref 3.5–5.1)
Sodium: 140 mmol/L (ref 135–145)
Total Bilirubin: 0.1 mg/dL — ABNORMAL LOW (ref 0.3–1.2)
Total Protein: 6.7 g/dL (ref 6.5–8.1)

## 2019-05-26 LAB — C-REACTIVE PROTEIN: CRP: 2.1 mg/dL — ABNORMAL HIGH (ref <1.0)

## 2019-05-26 LAB — FERRITIN: Ferritin: 171 ng/mL (ref 11–307)

## 2019-05-26 LAB — D-DIMER, QUANTITATIVE: D-Dimer, Quant: 0.48 ug/mL-FEU (ref 0.00–0.50)

## 2019-05-26 MED ORDER — METHYLPREDNISOLONE SODIUM SUCC 40 MG IJ SOLR: 40.0000 mg | Freq: Two times a day (BID) | INTRAMUSCULAR | Status: DC

## 2019-05-26 NOTE — Progress Notes (Signed)
PROGRESS NOTE                                                                                                                                                                                                             Patient Demographics:    Madison Phillips, is a 53 y.o. female, DOB - Apr 13, 1966, ZOX:[id number]  Admit date - 05/23/2019   Admitting Physician Rise Patience, MD  Outpatient Primary MD for the patient is Madison Blackbird, MD  LOS - 2   Chief Complaint  Patient presents with  . Shortness of Breath       Brief Narrative    53 y.o. female with history of COPD ,Acaustic neuroma presents to the ER because of increasing shortness of breath with some chest tightness x2 days.  On some dizziness,(dizziness is related to her history of left acoustic schwannoma), he was noted to be hypoxic, requiring 2 L nasal cannula, she tested positive for COVID-19, transferred to [facility] with admission for further management.   Subjective:    Madison Phillips today denies any dizziness, dyspnea has, no cough today .   Assessment  & Plan :    Principal Problem:   Acute respiratory failure with hypoxia (HCC) Active Problems:   History of acoustic neuroma   COPD with emphysema ([place])   Pneumonia due to COVID-19 virus  Acute respiratory failure with hypoxia secondary to COVID pneumonia  -patient has been placed on empiric antibiotics for community-acquired pneumonia, procalcitonin within normal limit, this is viral pneumonia, no indication for antibiotics, it was discontinued -Started on IV Solu-Medrol 05/24/2019, will start taper -Started on Remdesivir 05/24/2019 -She did require 2 L nasal cannula today, she is on room air today , will hold on Actemra  -Continue to trend inflammatory markers. -Continue with vitamin C and zinc  COVID-19 Labs  Recent Labs    05/24/19 0800 05/25/19 0500 05/26/19 0230  DDIMER 0.44 0.60* 0.48  FERRITIN 154 189 171  CRP 8.5* 4.9*  2.1*    Lab Results  Component Value Date   SARSCOV2NAA POSITIVE (A) 05/23/2019    COPD -No active wheezing, continue Combivent inhaler.   Dizziness -Resolved, no recurrence, she is following with neurology as an outpatient regarding acoustic schwannoma. - followed by Dr. Jannifer Franklin neurologist has had recent MRI 10 days ago.  Appears nonfocal.  Diarrhea  -  likely from [place]. -Resolved  Chest tightness likely from respiratory failure with secondary to COPD and hypoxia.    Normal troponins, resolved     Code Status : Full  Family Communication  : D/W patient  Disposition Plan  : Home when stable  Consults  :  None  Procedures  : None  DVT Prophylaxis  :  Lovenox  Lab Results  Component Value Date   PLT 194 05/26/2019    Antibiotics  :   Anti-infectives (From admission, onward)   Start     Dose/Rate Route Frequency Ordered Stop   05/25/19 0800  remdesivir 100 mg in sodium chloride 0.9 % 230 mL IVPB     100 mg 500 mL/hr over 30 Minutes Intravenous Every 24 hours 05/24/19 0355 05/29/19 0759   05/24/19 0430  remdesivir 200 mg in sodium chloride 0.9 % 210 mL IVPB     200 mg 500 mL/hr over 30 Minutes Intravenous Once 05/24/19 0355 05/24/19 0526   05/23/19 2230  cefTRIAXone (ROCEPHIN) 2 g in sodium chloride 0.9 % 100 mL IVPB  Status:  Discontinued     2 g 200 mL/hr over 30 Minutes Intravenous Every 24 hours 05/23/19 2219 05/24/19 1153   05/23/19 2230  azithromycin (ZITHROMAX) 500 mg in sodium chloride 0.9 % 250 mL IVPB  Status:  Discontinued     500 mg 250 mL/hr over 60 Minutes Intravenous Every 24 hours 05/23/19 2219 05/24/19 1153        Objective:   Vitals:   05/26/19 0815 05/26/19 1100 05/26/19 1139 05/26/19 1200  BP: 118/83  116/74   Pulse: 80 86 77 74  Resp: 16 18 (!) 23 (!) 24  Temp: 97.6 F (36.4 C)  97.6 F (36.4 C)   TempSrc: Oral  Oral   SpO2: 98% 93% 90% 93%  Weight:      Height:        Wt Readings from Last 3 Encounters:  05/23/19 77.1 kg   02/03/19 79.4 kg  01/30/19 80.7 kg     Intake/Output Summary (Last 24 hours) at 05/26/2019 1418 Last data filed at 05/25/2019 1957 Gross per 24 hour  Intake 240 ml  Output -  Net 240 ml     Physical Exam  Awake Alert, Oriented X 3, No new F.N deficits, Normal affect Symmetrical Chest wall movement, Good air movement bilaterally, CTAB RRR,No Gallops,Rubs or new Murmurs, No Parasternal Heave +ve B.Sounds, Abd Soft, No tenderness, No rebound - guarding or rigidity. No Cyanosis, Clubbing or edema, No new Rash or bruise       Data Review:    CBC Recent Labs  Lab 05/23/19 2128 05/24/19 0125 05/25/19 0500 05/26/19 0230  WBC 6.0 6.1 8.7 12.3*  HGB 14.8 13.6 13.9 13.7  HCT 45.5 42.6 44.5 42.6  PLT 141* 133* 167 194  MCV 95.2 95.5 97.6 97.3  MCH 31.0 30.5 30.5 31.3  MCHC 32.5 31.9 31.2 32.2  RDW 12.6 12.8 12.7 12.8  LYMPHSABS 1.3  --   --   --   MONOABS 0.4  --   --   --   EOSABS 0.0  --   --   --   BASOSABS 0.0  --   --   --     Chemistries  Recent Labs  Lab 05/23/19 2128 05/24/19 0125 05/24/19 0800 05/25/19 0500 05/26/19 0230  NA 141  --  142 143 140  K 4.4  --  4.4 4.6 4.4  CL 102  --  106 105 105  CO2 27  --  27 32 29  GLUCOSE 103*  --  156* 119* 113*  BUN 11  --  12 16 20   CREATININE 0.91 0.81 0.73 0.69 0.67  CALCIUM 9.2  --  8.4* 8.7* 8.4*  AST 23  --  21 20 22   ALT 14  --  13 15 15   ALKPHOS 63  --  57 54 56  BILITOT 0.4  --  0.2* 0.4 0.1*   ------------------------------------------------------------------------------------------------------------------ No results for input(s): CHOL, HDL, LDLCALC, TRIG, CHOLHDL, LDLDIRECT in the last 72 hours.  Lab Results  Component Value Date   HGBA1C 5.4 12/24/2018   ------------------------------------------------------------------------------------------------------------------ No results for input(s): TSH, T4TOTAL, T3FREE, THYROIDAB in the last 72 hours.  Invalid input(s): FREET3  ------------------------------------------------------------------------------------------------------------------ Recent Labs    05/25/19 0500 05/26/19 0230  FERRITIN 189 171    Coagulation profile No results for input(s): INR, PROTIME in the last 168 hours.  Recent Labs    05/25/19 0500 05/26/19 0230  DDIMER 0.60* 0.48    Cardiac Enzymes Recent Labs  Lab 05/23/19 2128  TROPONINI <0.03   ------------------------------------------------------------------------------------------------------------------    Component Value Date/Time   BNP 12.8 05/23/2019 2128    Inpatient Medications  Scheduled Meds: . aerochamber plus with mask  1 each Other Once  . enoxaparin (LOVENOX) injection  40 mg Subcutaneous Q24H  . famotidine  20 mg Oral BID  . methylPREDNISolone (SOLU-MEDROL) injection  40 mg Intravenous Q8H  . vitamin C  500 mg Oral Daily  . zinc sulfate  220 mg Oral Daily   Continuous Infusions: . remdesivir 100 mg in NS 250 mL 100 mg (05/26/19 0839)   PRN Meds:.acetaminophen **OR** acetaminophen, ondansetron **OR** ondansetron (ZOFRAN) IV, sodium chloride  Micro Results Recent Results (from the past 240 hour(s))  Blood Culture (routine x 2)     Status: None (Preliminary result)   Collection Time: 05/23/19  9:23 PM  Result Value Ref Range Status   Specimen Description BLOOD LEFT FOREARM  Final   Special Requests   Final    BOTTLES DRAWN AEROBIC AND ANAEROBIC Blood Culture adequate volume   Culture   Final    NO GROWTH 3 DAYS Performed at [facility], [place] [address], [place], [place] [address]    Report Status PENDING  Incomplete  Blood Culture (routine x 2)     Status: None (Preliminary result)   Collection Time: 05/23/19  9:28 PM  Result Value Ref Range Status   Specimen Description BLOOD LEFT ARM  Final   Special Requests   Final    BOTTLES DRAWN AEROBIC AND ANAEROBIC Blood Culture adequate volume   Culture   Final    NO GROWTH 3 DAYS Performed at  [facility], [address], [place], [place] [address]    Report Status PENDING  Incomplete  SARS Coronavirus 2 (CEPHEID- Performed in [place] hospital lab), Hosp Order     Status: Abnormal   Collection Time: 05/23/19  9:35 PM  Result Value Ref Range Status   SARS Coronavirus 2 POSITIVE (A) NEGATIVE Final    Comment: RESULT CALLED TO, READ BACK BY AND VERIFIED WITH: B OSORIO RN 05/23/19 2304 JDW (NOTE) If result is NEGATIVE SARS-CoV-2 target nucleic acids are NOT DETECTED. The SARS-CoV-2 RNA is generally detectable in upper and lower  respiratory specimens during the acute phase of infection. The lowest  concentration of SARS-CoV-2 viral copies this assay can detect is 250  copies / mL.  A negative result does not preclude SARS-CoV-2 infection  and should not be used as the sole basis for treatment or other  patient management decisions.  A negative result may occur with  improper specimen collection / handling, submission of specimen other  than nasopharyngeal swab, presence of viral mutation(s) within the  areas targeted by this assay, and inadequate number of viral copies  (<250 copies / mL). A negative result must be combined with clinical  observations, patient history, and epidemiological information. If result is POSITIVE SARS-CoV-2 target nucleic acids are DETECTED. The SARS- CoV-2 RNA is generally detectable in upper and lower  respiratory specimens during the acute phase of infection.  Positive  results are indicative of active infection with SARS-CoV-2.  Clinical  correlation with patient history and other diagnostic information is  necessary to determine patient infection status.  Positive results do  not rule out bacterial infection or co-infection with other viruses. If result is PRESUMPTIVE POSTIVE SARS-CoV-2 nucleic acids MAY BE PRESENT.   A presumptive positive result was obtained on the submitted specimen  and confirmed on repeat testing.  While 2019  novel coronavirus  (SARS-CoV-2) nucleic acids may be present in the submitted sample  additional confirmatory testing may be necessary for epidemiological  and / or clinical management purposes  to differentiate between  SARS-CoV-2 and other Sarbecovirus currently known to infect humans.  If clinically indicated additional testing with an alternate test  methodology [phone]) is advised . The SARS-CoV-2 RNA is generally  detectable in upper and lower respiratory specimens during the acute  phase of infection. The expected result is Negative. Fact Sheet for Patients:  StrictlyIdeas.no Fact Sheet for Healthcare Providers: BankingDealers.co.za This test is not yet approved or cleared by the [place] FDA and has been authorized for detection and/or diagnosis of SARS-CoV-2 by FDA under an Emergency Use Authorization (EUA).  This EUA will remain in effect (meaning this test can be used) for the duration of the COVID-19 declaration under Section 564(b)(1) of the Act, 21 U.S.C. section 360bbb-3(b)(1), unless the authorization is terminated or revoked sooner. Performed at [facility], [place] [address], [place], [place] [address]     Radiology Reports Mr Jeri Cos Wo Contrast  Result Date: 05/13/2019  [facility] [address] [place], [place] [address] [phone] NEUROIMAGING REPORT STUDY DATE: 05/11/2019 PATIENT NAME: KOLLINS FENTER DOB: 1966/09/27 MRN: [id number] EXAM: MRI Brain with and without contrast ORDERING CLINICIAN: Kathrynn Ducking, MD CLINICAL HISTORY: 53 year old woman with history of left acoustic neuroma, hearing loss and left facial pain COMPARISON FILMS: None available TECHNIQUE:MRI of the brain with and without contrast was obtained utilizing 5 mm axial slices with T1, T2, T2 flair, SWI and diffusion weighted views.  T1 sagittal, T2 coronal and postcontrast views in the axial and coronal plane were  obtained.  Additional thin section pre-and postcontrast T1-weighted images through the internal auditory canals and axial CISS images were also obtained CONTRAST: 17 ml [employer] IMAGING SITE: [facility], [place]. FINDINGS: On sagittal images, the spinal cord is imaged caudally to C3-C4 and is normal in caliber.   The contents of the posterior fossa are of normal size and position.   The pituitary gland and optic chiasm appear normal.    Brain volume appears normal.   The ventricles are normal in size and without distortion.  There are no abnormal extra-axial collections of fluid.  The cerebellum and brainstem appears normal.   The deep  gray matter appears normal.  In the hemispheres, there are some scattered T2/flair hyperintense foci in the subcortical and and deep white matter.  None of these appear to be acute.   Diffusion weighted images are normal.  Susceptibility weighted images show a single chronic microhemorrhage in the left striatum. There is a 18 x 10 x 10 mm (transverse, height, AP) homogenously enhancing lesion in the left internal auditory canals consistent with an acoustic schwannoma.  The orbits appear normal.   The mastoid air cells appear normal.  There is a mucous retention cyst in the left maxillary sinus.  The other paranasal sinuses appear normal.  Flow voids are identified within the major intracerebral arteries.    This MRI of the brain with and without contrast shows the following: 1.  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 2.   There is a normal enhancement pattern elsewhere in the brain. 3.   Mild chronic microvascular ischemic changes.  There were no acute findings. INTERPRETING PHYSICIAN: Richard A. Felecia Shelling, MD, PhD, FAAN Certified in  Neuroimaging by [employer]   Dg Chest [place] 1 View  Result Date: 05/23/2019 CLINICAL DATA:  Dyspnea EXAM: PORTABLE CHEST 1 VIEW  COMPARISON:  12/24/2018 FINDINGS: There is a new right lower lobe airspace opacity. Emphysematous changes are noted bilaterally. There is scarring versus atelectasis at the right lung base. The heart size is stable from prior study. IMPRESSION: New right lower lung zone airspace opacity concerning for developing pneumonia. Electronically Signed   By: Constance Holster M.D.   On: 05/23/2019 22:03    Time Spent in minutes : 25 minutes   Phillips Climes M.D on 05/26/2019 at 2:18 PM  Between 7am to 7pm - Pager - [phone]  After 7pm go to [email or web address] - password [email or web address]  [facility] -  Office  [phone]

## 2019-05-26 NOTE — Plan of Care (Signed)
  Problem: Health Behavior/Discharge Planning: Goal: Ability to manage health-related needs will improve Outcome: Progressing   Problem: Clinical Measurements: Goal: Ability to maintain clinical measurements within normal limits will improve Outcome: Progressing Goal: Diagnostic test results will improve Outcome: Progressing Goal: Respiratory complications will improve Outcome: Progressing Goal: Cardiovascular complication will be avoided Outcome: Progressing   Problem: Coping: Goal: Level of anxiety will decrease Outcome: Progressing   Problem: Education: Goal: Knowledge of risk factors and measures for prevention of condition will improve Outcome: Progressing   Problem: Coping: Goal: Psychosocial and spiritual needs will be supported Outcome: Progressing   Problem: Respiratory: Goal: Will maintain a patent airway Outcome: Progressing Goal: Complications related to the disease process, condition or treatment will be avoided or minimized Outcome: Progressing

## 2019-05-26 NOTE — Progress Notes (Signed)
CardioVascular Research Department and AHF Team  ReDS Research Project   Patient #: [id number]  ReDS Measurement  Right: 38 %  Left: 20 %

## 2019-05-26 NOTE — Progress Notes (Addendum)
0848  Sitting up in chair watching tv.  O2 sat 97% on 2l/[place].  Using incentive spirometry.  Ambulated in room to toilet and back to chair.  Still short of breath with exertion.  1430  O2 sat 94-97% on room air at rest.  With any activity O2 sats drop to 86-88%.  Patient states that is why she is on O2 at home.  1647  Ambulated in hallway with 2l/[place].  O2 sat upon returning to room is 90%

## 2019-05-27 LAB — FERRITIN: Ferritin: 154 ng/mL (ref 11–307)

## 2019-05-27 LAB — COMPREHENSIVE METABOLIC PANEL
ALT: 18 U/L (ref 0–44)
AST: 20 U/L (ref 15–41)
Albumin: 3.6 g/dL (ref 3.5–5.0)
Alkaline Phosphatase: 56 U/L (ref 38–126)
Anion gap: 6 (ref 5–15)
BUN: 22 mg/dL — ABNORMAL HIGH (ref 6–20)
CO2: 32 mmol/L (ref 22–32)
Calcium: 8.7 mg/dL — ABNORMAL LOW (ref 8.9–10.3)
Chloride: 104 mmol/L (ref 98–111)
Creatinine, Ser: 0.72 mg/dL (ref 0.44–1.00)
GFR calc Af Amer: >60 mL/min (ref >60)
GFR calc non Af Amer: >60 mL/min (ref >60)
Glucose, Bld: 69 mg/dL — ABNORMAL LOW (ref 70–99)
Potassium: 3.9 mmol/L (ref 3.5–5.1)
Sodium: 142 mmol/L (ref 135–145)
Total Bilirubin: 0.3 mg/dL (ref 0.3–1.2)
Total Protein: 6.3 g/dL — ABNORMAL LOW (ref 6.5–8.1)

## 2019-05-27 LAB — CBC
HCT: 43.8 % (ref 36.0–46.0)
Hemoglobin: 14.2 g/dL (ref 12.0–15.0)
MCH: 31.5 pg (ref 26.0–34.0)
MCHC: 32.4 g/dL (ref 30.0–36.0)
MCV: 97.1 fL (ref 80.0–100.0)
Platelets: 220 10*3/uL (ref 150–400)
RBC: 4.51 MIL/uL (ref 3.87–5.11)
RDW: 12.9 % (ref 11.5–15.5)
WBC: 8.7 10*3/uL (ref 4.0–10.5)
nRBC: 0 % (ref 0.0–0.2)

## 2019-05-27 LAB — C-REACTIVE PROTEIN: CRP: 1.7 mg/dL — ABNORMAL HIGH (ref <1.0)

## 2019-05-27 LAB — D-DIMER, QUANTITATIVE: D-Dimer, Quant: 0.57 ug/mL-FEU — ABNORMAL HIGH (ref 0.00–0.50)

## 2019-05-27 MED ORDER — METHYLPREDNISOLONE SODIUM SUCC 40 MG IJ SOLR
40.0000 mg | Freq: Every day | INTRAMUSCULAR | Status: DC
  Administered 2019-05-27 – 2019-05-28 (×2): 40 mg via INTRAVENOUS
  Filled 2019-05-27 (×2): qty 1

## 2019-05-27 NOTE — Progress Notes (Addendum)
0723  Sitting up in chair watching tv.  O2 sat 95% on 2l/[place].    1115  Called to room.  Patient states she is having trouble breathing.  She had just returned to chair from toilet.  She wasn't wearing her oxygen.  O2 sat in low 80's.  O2 at 2l/[place] applied.  Sat increased to 97%.  Lung sounds present but diminished, more so on the right.  Instructed purse lip breathing technique.    1200  Patient states she feels better now.  Instructed her to wear O2 to toilet from now on.  Voiced understanding  1847  Sitting up in chair.  O2 at 2l/[place].  O2 sats 92-93%.

## 2019-05-27 NOTE — Progress Notes (Signed)
CardioVascular Research Department and AHF Team  ReDS Research Project   Patient #: [id number]  ReDS Measurement  Right: low quality x 3  Left: 19 %

## 2019-05-27 NOTE — Progress Notes (Signed)
PROGRESS NOTE                                                                                                                                                                                                             Patient Demographics:    Madison Phillips, is a 53 y.o. female, DOB - [birth year], LYY:[id number]  Admit date - 05/23/2019   Admitting Physician Rise Patience, MD  Outpatient Primary MD for the patient is Madison Blackbird, MD  LOS - 3   Chief Complaint  Patient presents with  . Shortness of Breath       Brief Narrative    53 y.o. female with history of COPD ,Acaustic neuroma presents to the ER because of increasing shortness of breath with some chest tightness x2 days.  On some dizziness,(dizziness is related to her history of left acoustic schwannoma), he was noted to be hypoxic, requiring 2 L nasal cannula, she tested positive for COVID-19, transferred to [facility] with admission for further management.   Subjective:    [name] today denies any dizziness, dyspnea has, no cough today .   Assessment  & Plan :    Principal Problem:   Acute respiratory failure with hypoxia (HCC) Active Problems:   History of acoustic neuroma   COPD with emphysema ([place])   Pneumonia due to COVID-19 virus  Acute on chronic respiratory failure with hypoxia secondary to COVID pneumonia  -patient has been placed on empiric antibiotics for community-acquired pneumonia, procalcitonin within normal limit, this is viral pneumonia, no indication for antibiotics, it was discontinued -Started on IV Solu-Medrol 05/24/2019, will start taper, she will be discharged on prednisone -Started on Remdesivir 05/24/2019, tomorrow will be dose #5 - will hold on Actemra, giving inflammatory markers trending down, improving respiratory status -He is on 2 L upon exertion at baseline, initially she was requiring 2 L even at rest, this has improved, currently back to baseline.  -Inflammatory markers trending down which is reassuring -Continue with vitamin C and zinc  COVID-19 Labs  Recent Labs    05/25/19 0500 05/26/19 0230 05/27/19 0400  DDIMER 0.60* 0.48 0.57*  FERRITIN 189 171 154  CRP 4.9* 2.1* 1.7*    Lab Results  Component Value Date   SARSCOV2NAA POSITIVE (A) 05/23/2019    COPD -No active wheezing, continue Combivent inhaler.   Dizziness -  Resolved, no recurrence, she is following with neurology as an outpatient regarding acoustic schwannoma. - followed by Dr. Jannifer Franklin neurologist has had recent MRI 10 days ago.  Appears nonfocal.  Diarrhea  - likely from [place]. -Resolved  Chest tightness likely from respiratory failure with secondary to COPD and hypoxia.    Normal troponins, resolved     Code Status : Full  Family Communication  : D/W patient  Disposition Plan  : Home when stable  Consults  :  None  Procedures  : None  DVT Prophylaxis  :  Lovenox  Lab Results  Component Value Date   PLT 220 05/27/2019    Antibiotics  :   Anti-infectives (From admission, onward)   Start     Dose/Rate Route Frequency Ordered Stop   05/25/19 0800  remdesivir 100 mg in sodium chloride 0.9 % 230 mL IVPB     100 mg 500 mL/hr over 30 Minutes Intravenous Every 24 hours 05/24/19 0355 05/29/19 0759   05/24/19 0430  remdesivir 200 mg in sodium chloride 0.9 % 210 mL IVPB     200 mg 500 mL/hr over 30 Minutes Intravenous Once 05/24/19 0355 05/24/19 0526   05/23/19 2230  cefTRIAXone (ROCEPHIN) 2 g in sodium chloride 0.9 % 100 mL IVPB  Status:  Discontinued     2 g 200 mL/hr over 30 Minutes Intravenous Every 24 hours 05/23/19 2219 05/24/19 1153   05/23/19 2230  azithromycin (ZITHROMAX) 500 mg in sodium chloride 0.9 % 250 mL IVPB  Status:  Discontinued     500 mg 250 mL/hr over 60 Minutes Intravenous Every 24 hours 05/23/19 2219 05/24/19 1153        Objective:   Vitals:   05/27/19 0200 05/27/19 0400 05/27/19 0441 05/27/19 0816  BP:   (!)  148/97 (!) 138/108  Pulse: (!) 57 (!) 59 63   Resp: (!) 23 (!) 24 19   Temp:   98.4 F (36.9 C) 98.4 F (36.9 C)  TempSrc:   Oral Oral  SpO2: 92% 93% 98%   Weight:      Height:        Wt Readings from Last 3 Encounters:  05/23/19 77.1 kg  02/03/19 79.4 kg  01/30/19 80.7 kg     Intake/Output Summary (Last 24 hours) at 05/27/2019 1103 Last data filed at 05/27/2019 0400 Gross per 24 hour  Intake 1080 ml  Output -  Net 1080 ml     Physical Exam  Awake Alert, Oriented X 3, No new F.N deficits, Normal affect Symmetrical Chest wall movement, Good air movement bilaterally, CTAB RRR,No Gallops,Rubs or new Murmurs, No Parasternal Heave +ve B.Sounds, Abd Soft, No tenderness, No rebound - guarding or rigidity. No Cyanosis, Clubbing or edema, No new Rash or bruise        Data Review:    CBC Recent Labs  Lab 05/23/19 2128 05/24/19 0125 05/25/19 0500 05/26/19 0230 05/27/19 0400  WBC 6.0 6.1 8.7 12.3* 8.7  HGB 14.8 13.6 13.9 13.7 14.2  HCT 45.5 42.6 44.5 42.6 43.8  PLT 141* 133* 167 194 220  MCV 95.2 95.5 97.6 97.3 97.1  MCH 31.0 30.5 30.5 31.3 31.5  MCHC 32.5 31.9 31.2 32.2 32.4  RDW 12.6 12.8 12.7 12.8 12.9  LYMPHSABS 1.3  --   --   --   --   MONOABS 0.4  --   --   --   --   EOSABS 0.0  --   --   --   --  BASOSABS 0.0  --   --   --   --     Chemistries  Recent Labs  Lab 05/23/19 2128 05/24/19 0125 05/24/19 0800 05/25/19 0500 05/26/19 0230 05/27/19 0400  NA 141  --  142 143 140 142  K 4.4  --  4.4 4.6 4.4 3.9  CL 102  --  106 105 105 104  CO2 27  --  27 32 29 32  GLUCOSE 103*  --  156* 119* 113* 69*  BUN 11  --  12 16 20  22*  CREATININE 0.91 0.81 0.73 0.69 0.67 0.72  CALCIUM 9.2  --  8.4* 8.7* 8.4* 8.7*  AST 23  --  21 20 22 20   ALT 14  --  13 15 15 18   ALKPHOS 63  --  57 54 56 56  BILITOT 0.4  --  0.2* 0.4 0.1* 0.3   ------------------------------------------------------------------------------------------------------------------ No results for  input(s): CHOL, HDL, LDLCALC, TRIG, CHOLHDL, LDLDIRECT in the last 72 hours.  Lab Results  Component Value Date   HGBA1C 5.4 12/24/2018   ------------------------------------------------------------------------------------------------------------------ No results for input(s): TSH, T4TOTAL, T3FREE, THYROIDAB in the last 72 hours.  Invalid input(s): FREET3 ------------------------------------------------------------------------------------------------------------------ Recent Labs    05/26/19 0230 05/27/19 0400  FERRITIN 171 154    Coagulation profile No results for input(s): INR, PROTIME in the last 168 hours.  Recent Labs    05/26/19 0230 05/27/19 0400  DDIMER 0.48 0.57*    Cardiac Enzymes Recent Labs  Lab 05/23/19 2128  TROPONINI <0.03   ------------------------------------------------------------------------------------------------------------------    Component Value Date/Time   BNP 12.8 05/23/2019 2128    Inpatient Medications  Scheduled Meds: . aerochamber plus with mask  1 each Other Once  . enoxaparin (LOVENOX) injection  40 mg Subcutaneous Q24H  . famotidine  20 mg Oral BID  . methylPREDNISolone (SOLU-MEDROL) injection  40 mg Intravenous Daily  . vitamin C  500 mg Oral Daily  . zinc sulfate  220 mg Oral Daily   Continuous Infusions: . remdesivir 100 mg in NS 250 mL 100 mg (05/27/19 0910)   PRN Meds:.acetaminophen **OR** acetaminophen, ondansetron **OR** ondansetron (ZOFRAN) IV, sodium chloride  Micro Results Recent Results (from the past 240 hour(s))  Blood Culture (routine x 2)     Status: None (Preliminary result)   Collection Time: 05/23/19  9:23 PM  Result Value Ref Range Status   Specimen Description BLOOD LEFT FOREARM  Final   Special Requests   Final    BOTTLES DRAWN AEROBIC AND ANAEROBIC Blood Culture adequate volume   Culture   Final    NO GROWTH 4 DAYS Performed at [facility], [place] [address], [place], [place] [address]     Report Status PENDING  Incomplete  Blood Culture (routine x 2)     Status: None (Preliminary result)   Collection Time: 05/23/19  9:28 PM  Result Value Ref Range Status   Specimen Description BLOOD LEFT ARM  Final   Special Requests   Final    BOTTLES DRAWN AEROBIC AND ANAEROBIC Blood Culture adequate volume   Culture   Final    NO GROWTH 4 DAYS Performed at [facility], [address], [place], [place] [address]    Report Status PENDING  Incomplete  SARS Coronavirus 2 (CEPHEID- Performed in [facility] hospital lab), Hosp Order     Status: Abnormal   Collection Time: 05/23/19  9:35 PM  Result Value Ref Range Status   SARS Coronavirus 2 POSITIVE (  A) NEGATIVE Final    Comment: RESULT CALLED TO, READ BACK BY AND VERIFIED WITH: B OSORIO RN 05/23/19 2304 JDW (NOTE) If result is NEGATIVE SARS-CoV-2 target nucleic acids are NOT DETECTED. The SARS-CoV-2 RNA is generally detectable in upper and lower  respiratory specimens during the acute phase of infection. The lowest  concentration of SARS-CoV-2 viral copies this assay can detect is 250  copies / mL. A negative result does not preclude SARS-CoV-2 infection  and should not be used as the sole basis for treatment or other  patient management decisions.  A negative result may occur with  improper specimen collection / handling, submission of specimen other  than nasopharyngeal swab, presence of viral mutation(s) within the  areas targeted by this assay, and inadequate number of viral copies  (<250 copies / mL). A negative result must be combined with clinical  observations, patient history, and epidemiological information. If result is POSITIVE SARS-CoV-2 target nucleic acids are DETECTED. The SARS- CoV-2 RNA is generally detectable in upper and lower  respiratory specimens during the acute phase of infection.  Positive  results are indicative of active infection with SARS-CoV-2.  Clinical  correlation with patient history and  other diagnostic information is  necessary to determine patient infection status.  Positive results do  not rule out bacterial infection or co-infection with other viruses. If result is PRESUMPTIVE POSTIVE SARS-CoV-2 nucleic acids MAY BE PRESENT.   A presumptive positive result was obtained on the submitted specimen  and confirmed on repeat testing.  While 2019 novel coronavirus  (SARS-CoV-2) nucleic acids may be present in the submitted sample  additional confirmatory testing may be necessary for epidemiological  and / or clinical management purposes  to differentiate between  SARS-CoV-2 and other Sarbecovirus currently known to infect humans.  If clinically indicated additional testing with an alternate test  methodology [phone]) is advised . The SARS-CoV-2 RNA is generally  detectable in upper and lower respiratory specimens during the acute  phase of infection. The expected result is Negative. Fact Sheet for Patients:  StrictlyIdeas.no Fact Sheet for Healthcare Providers: BankingDealers.co.za This test is not yet approved or cleared by the [place] FDA and has been authorized for detection and/or diagnosis of SARS-CoV-2 by FDA under an Emergency Use Authorization (EUA).  This EUA will remain in effect (meaning this test can be used) for the duration of the COVID-19 declaration under Section 564(b)(1) of the Act, 21 U.S.C. section 360bbb-3(b)(1), unless the authorization is terminated or revoked sooner. Performed at [facility], [place] [address], [place], [place] [address]     Radiology Reports Mr Jeri Cos Wo Contrast  Result Date: 05/13/2019  [facility] [address] [place], [place] [address] [phone] NEUROIMAGING REPORT STUDY DATE: 05/11/2019 PATIENT NAME: TARIYAH PENDRY DOB: [birth year] MRN: [id number] EXAM: MRI Brain with and without contrast ORDERING CLINICIAN: Kathrynn Ducking, MD CLINICAL  HISTORY: 53 year old woman with history of left acoustic neuroma, hearing loss and left facial pain COMPARISON FILMS: None available TECHNIQUE:MRI of the brain with and without contrast was obtained utilizing 5 mm axial slices with T1, T2, T2 flair, SWI and diffusion weighted views.  T1 sagittal, T2 coronal and postcontrast views in the axial and coronal plane were obtained.  Additional thin section pre-and postcontrast T1-weighted images through the internal auditory canals and axial CISS images were also obtained CONTRAST: 17 ml [facility] IMAGING SITE: [facility], [place]. FINDINGS: On sagittal images, the spinal cord is  imaged caudally to C3-C4 and is normal in caliber.   The contents of the posterior fossa are of normal size and position.   The pituitary gland and optic chiasm appear normal.    Brain volume appears normal.   The ventricles are normal in size and without distortion.  There are no abnormal extra-axial collections of fluid.  The cerebellum and brainstem appears normal.   The deep gray matter appears normal.  In the hemispheres, there are some scattered T2/flair hyperintense foci in the subcortical and and deep white matter.  None of these appear to be acute.   Diffusion weighted images are normal.  Susceptibility weighted images show a single chronic microhemorrhage in the left striatum. There is a 18 x 10 x 10 mm (transverse, height, AP) homogenously enhancing lesion in the left internal auditory canals consistent with an acoustic schwannoma.  The orbits appear normal.   The mastoid air cells appear normal.  There is a mucous retention cyst in the left maxillary sinus.  The other paranasal sinuses appear normal.  Flow voids are identified within the major intracerebral arteries.    This MRI of the brain with and without contrast shows the following: 1.  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  2.   There is a normal enhancement pattern elsewhere in the brain. 3.   Mild chronic microvascular ischemic changes.  There were no acute findings. INTERPRETING PHYSICIAN: Richard A. Felecia Shelling, MD, PhD, FAAN Certified in  Neuroimaging by [employer]   Dg Chest [place] 1 View  Result Date: 05/23/2019 CLINICAL DATA:  Dyspnea EXAM: PORTABLE CHEST 1 VIEW COMPARISON:  12/24/2018 FINDINGS: There is a new right lower lobe airspace opacity. Emphysematous changes are noted bilaterally. There is scarring versus atelectasis at the right lung base. The heart size is stable from prior study. IMPRESSION: New right lower lung zone airspace opacity concerning for developing pneumonia. Electronically Signed   By: Constance Holster M.D.   On: 05/23/2019 22:03    Time Spent in minutes : 25 minutes   Phillips Climes M.D on 05/27/2019 at 11:03 AM  Between 7am to 7pm - Pager - [phone]  After 7pm go to [email or web address] - password [facility]  [facility] -  Office  [phone]

## 2019-05-27 NOTE — Plan of Care (Signed)
  Problem: Health Behavior/Discharge Planning: Goal: Ability to manage health-related needs will improve Outcome: Progressing   Problem: Clinical Measurements: Goal: Ability to maintain clinical measurements within normal limits will improve Outcome: Progressing Goal: Diagnostic test results will improve Outcome: Progressing Goal: Respiratory complications will improve Outcome: Progressing Goal: Cardiovascular complication will be avoided Outcome: Progressing   Problem: Coping: Goal: Level of anxiety will decrease Outcome: Progressing   Problem: Education: Goal: Knowledge of risk factors and measures for prevention of condition will improve Outcome: Progressing   Problem: Coping: Goal: Psychosocial and spiritual needs will be supported Outcome: Progressing   Problem: Respiratory: Goal: Will maintain a patent airway Outcome: Progressing Goal: Complications related to the disease process, condition or treatment will be avoided or minimized Outcome: Progressing

## 2019-05-28 DIAGNOSIS — J439 Emphysema, unspecified: Secondary | ICD-10-CM

## 2019-05-28 LAB — CULTURE, BLOOD (ROUTINE X 2)
Culture: NO GROWTH
Culture: NO GROWTH
Special Requests: ADEQUATE
Special Requests: ADEQUATE

## 2019-05-28 MED ORDER — ASCORBIC ACID 500 MG PO TABS: 500.0000 mg | ORAL_TABLET | Freq: Every day | ORAL | Status: AC

## 2019-05-28 MED ORDER — PREDNISONE 10 MG (21) PO TBPK: ORAL_TABLET | ORAL | 0 refills | Status: DC

## 2019-05-28 MED ORDER — ZINC SULFATE 220 (50 ZN) MG PO CAPS: 220.0000 mg | ORAL_CAPSULE | Freq: Every day | ORAL | Status: AC

## 2019-05-28 NOTE — Discharge Summary (Addendum)
Madison Phillips, is a 53 y.o. female  DOB 09-18-1966  MRN [id number].  Admission date:  05/23/2019  Admitting Physician  Rise Patience, MD  Discharge Date:  05/28/2019   Primary MD  [name], MD  Recommendations for primary care physician for things to follow:  - please check CBC, BMP, LFTs , during next visit   Admission Diagnosis  Community acquired pneumonia of right lower lobe of lung ([place]) [J18.1] COVID-19 virus infection [U07.1] Pneumonia due to COVID-19 virus [U07.1, J12.89]   Discharge Diagnosis  Community acquired pneumonia of right lower lobe of lung ([place]) [J18.1] COVID-19 virus infection [U07.1] Pneumonia due to COVID-19 virus [U07.1, J12.89]    Principal Problem:   Acute respiratory failure with hypoxia ([place]) Active Problems:   History of acoustic neuroma   COPD with emphysema ([place])   Pneumonia due to COVID-19 virus      Past Medical History:  Diagnosis Date  . COPD (chronic obstructive pulmonary disease) ([place])   . GERD (gastroesophageal reflux disease) 12/25/2018  . 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 Left ear hearing loss 12/05/2018    Past Surgical History:  Procedure Laterality Date  . cyber knife    . ectopic pregnanacy         History of present illness and  Hospital Course:     Kindly see H&P for history of present illness and admission details, please review complete Labs, Consult reports and Test reports for all details in brief  HPI  from the history and physical done on the day of admission 05/23/2019  HPI: Madison Phillips is a 53 y.o. female with history of COPD caustic neuroma presents to the ER because of increasing shortness of breath with some chest tightness x2 days.  Also feeling increasingly dizzy.  Dizziness is present even at rest increased on moving.  Denies any focal deficit headache nausea vomiting abdominal pain.  ED Course: In the ER  patient was febrile with temperature of 100.5 F heart rate was around 123 bpm EKG showing sinus tachycardia chest x-ray showing infiltrates and patient was hypoxic initially requiring 5 L oxygen to maintain sats.  Blood cultures obtained started on empiric antibiotics for community-acquired pneumonia.  While in the ER patient had episode of diarrhea.  Patient's COVID-19 test came positive and is being admitted for acute respiratory failure with COVID pneumonia.  Hospital Course   53 y.o.femalewithhistory of COPD ,Acaustic neuroma presents to the ER because of increasing shortness of breath with some chest tightness x2 days.  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   Acute on chronic respiratory failure with hypoxia secondary to COVID pneumonia -patient has been placed on empiric antibiotics for community-acquired pneumonia, procalcitonin within normal limit, this is viral pneumonia, no indication for antibiotics, it was discontinued -Started on IV Solu-Medrol 05/24/2019, she will be discharged on  prednisone taper -Started on Remdesivir 05/24/2019, finished total 5 days. -did not receive  Actemra, giving inflammatory markers trending down, improving respiratory status -She is on 2 L upon exertion at baseline, initially she was requiring 2 L even at rest, this has improved, currently back to baseline. -Inflammatory markers trending down which is reassuring -Continue with vitamin C and zinc  COVID-19 Labs  RecentLabs(last2labs)       Recent Labs    05/25/19 0500 05/26/19 0230 05/27/19 0400  DDIMER 0.60* 0.48 0.57*  FERRITIN 189 171 154  CRP 4.9* 2.1* 1.7*      RecentLabs  Lab Results  Component Value Date   SARSCOV2NAA POSITIVE (A) 05/23/2019      COPD -No active wheezing,  continue Combivent inhaler.   Dizziness -Resolved, no recurrence, she is following with neurology as an outpatient regarding acoustic schwannoma. - followed by Dr. Jannifer Franklin neurologist has had recent MRI 10 days ago. Appears nonfocal.  Diarrhea  - likely from [place]. -Resolved  Chest tightness likely from respiratory failure with secondary to COPD and hypoxia.   Normal troponins, resolved  Had couple episodes of elevated blood pressure readings, but overall it has been well controlled, so I will hold off on initiating any new medications, likely these feelings related to steroids.    Discharge Condition: Stable   Follow UP  Follow-up Information    Fulp, Cammie, MD Follow up in 2 week(s).   Specialty:  Family Medicine Contact information: [place] [address] [phone]             Discharge Instructions  and  Discharge Medications    Discharge Instructions    Discharge instructions   Complete by:  As directed    Follow with Primary MD [name], MD in 7 days   Get CBC, CMP,  checked  by Primary MD next visit.    Activity: As tolerated with Full fall precautions use walker/cane & assistance as needed   Disposition Home    Diet: Heart Healthy ,   On your next visit with your primary care physician please Get Medicines reviewed and adjusted.   Please request your Prim.MD to go over all Hospital Tests and Procedure/Radiological results at the follow up, please get all Hospital records sent to your Prim MD by signing hospital release before you go home.   If you experience worsening of your admission symptoms, develop shortness of breath, life threatening emergency, suicidal or homicidal thoughts you must seek medical attention immediately by calling 911 or calling your MD immediately  if symptoms less severe.  You Must read complete instructions/literature along with all the possible adverse reactions/side effects for all  the Medicines you take and that have been prescribed to you. Take any new Medicines after you have completely understood and accpet all the possible adverse reactions/side effects.   Do not drive, operating heavy machinery, perform activities at heights, swimming or participation in water activities or provide baby sitting services if your were admitted for syncope or siezures until you have seen by Primary MD or a Neurologist and advised to do so again.  Do not drive when taking Pain medications.    Do not take more than prescribed Pain, Sleep and Anxiety Medications  Special Instructions: If you have smoked or chewed Tobacco  in the last 2 yrs please stop smoking, stop any regular Alcohol  and or any Recreational drug use.  Wear Seat belts while driving.   Please note  You were  cared for by a hospitalist during your hospital stay. If you have any questions about your discharge medications or the care you received while you were in the hospital after you are discharged, you can call the unit and asked to speak with the hospitalist on call if the hospitalist that took care of you is not available. Once you are discharged, your primary care physician will handle any further medical issues. Please note that NO REFILLS for any discharge medications will be authorized once you are discharged, as it is imperative that you return to your primary care physician (or establish a relationship with a primary care physician if you do not have one) for your aftercare needs so that they can reassess your need for medications and monitor your lab values.   Increase activity slowly   Complete by:  As directed      Allergies as of 05/28/2019   No Known Allergies     Medication List    TAKE these medications   albuterol (2.5 MG/3ML) 0.083% nebulizer solution Commonly known as:  PROVENTIL Take 3 mLs (2.5 mg total) by nebulization every 6 (six) hours as needed for wheezing or shortness of breath.    ascorbic acid 500 MG tablet Commonly known as:  VITAMIN C Take 1 tablet (500 mg total) by mouth daily. Please take for 10 days Start taking on:  May 29, 2019   carbamazepine 200 MG tablet Commonly known as:  TEGRETOL 1/2 tablet twice daily for 2 weeks then take 1 full tablet twice daily   Fluticasone-Umeclidin-Vilant 100-62.5-25 MCG/INH Aepb Commonly known as:  Trelegy Ellipta Inhale 1 puff into the lungs daily.   ibuprofen 800 MG tablet Commonly known as:  ADVIL Take 1 tablet (800 mg total) by mouth every 8 (eight) hours as needed.   ipratropium-albuterol 0.5-2.5 (3) MG/3ML Soln Commonly known as:  DUONEB Use 4 times daily on schedule What changed:    how much to take  how to take this  when to take this  additional instructions   predniSONE 10 MG (21) Tbpk tablet Commonly known as:  STERAPRED UNI-PAK 21 TAB Please use per package instruction   zinc sulfate 220 (50 Zn) MG capsule Take 1 capsule (220 mg total) by mouth daily. Please take for 10 days Start taking on:  May 29, 2019         Diet and Activity recommendation: See Discharge Instructions above   Consults obtained -  None   Major procedures and Radiology Reports - PLEASE review detailed and final reports for all details, in brief -      Mr Jeri Cos Wo Contrast  Result Date: 05/13/2019  [facility] [address], [place], [place] [address] [phone] NEUROIMAGING REPORT STUDY DATE: 05/11/2019 PATIENT NAME: URA YINGLING DOB: 1966-12-04 MRN: [id number] EXAM: MRI Brain with and without contrast ORDERING CLINICIAN: Kathrynn Ducking, MD CLINICAL HISTORY: 53 year old woman with history of left acoustic neuroma, hearing loss and left facial pain COMPARISON FILMS: None available TECHNIQUE:MRI of the brain with and without contrast was obtained utilizing 5 mm axial slices with T1, T2, T2 flair, SWI and diffusion weighted views.  T1 sagittal, T2 coronal and postcontrast views in the  axial and coronal plane were obtained.  Additional thin section pre-and postcontrast T1-weighted images through the internal auditory canals and axial CISS images were also obtained CONTRAST: 17 ml Multihance IMAGING SITE: [facility], [place]. FINDINGS: On sagittal images, the spinal cord is imaged  caudally to C3-C4 and is normal in caliber.   The contents of the posterior fossa are of normal size and position.   The pituitary gland and optic chiasm appear normal.    Brain volume appears normal.   The ventricles are normal in size and without distortion.  There are no abnormal extra-axial collections of fluid.  The cerebellum and brainstem appears normal.   The deep gray matter appears normal.  In the hemispheres, there are some scattered T2/flair hyperintense foci in the subcortical and and deep white matter.  None of these appear to be acute.   Diffusion weighted images are normal.  Susceptibility weighted images show a single chronic microhemorrhage in the left striatum. There is a 18 x 10 x 10 mm (transverse, height, AP) homogenously enhancing lesion in the left internal auditory canals consistent with an acoustic schwannoma.  The orbits appear normal.   The mastoid air cells appear normal.  There is a mucous retention cyst in the left maxillary sinus.  The other paranasal sinuses appear normal.  Flow voids are identified within the major intracerebral arteries.    This MRI of the brain with and without contrast shows the following: 1.  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 2.   There is a normal enhancement pattern elsewhere in the brain. 3.   Mild chronic microvascular ischemic changes.  There were no acute findings. INTERPRETING PHYSICIAN: Richard A. Felecia Shelling, MD, PhD, FAAN Certified in  Neuroimaging by [employer]   Dg Chest [place] 1 View  Result Date: 05/23/2019 CLINICAL DATA:   Dyspnea EXAM: PORTABLE CHEST 1 VIEW COMPARISON:  12/24/2018 FINDINGS: There is a new right lower lobe airspace opacity. Emphysematous changes are noted bilaterally. There is scarring versus atelectasis at the right lung base. The heart size is stable from prior study. IMPRESSION: New right lower lung zone airspace opacity concerning for developing pneumonia. Electronically Signed   By: Constance Holster M.D.   On: 05/23/2019 22:03    Micro Results    Recent Results (from the past 240 hour(s))  Blood Culture (routine x 2)     Status: None (Preliminary result)   Collection Time: 05/23/19  9:23 PM  Result Value Ref Range Status   Specimen Description BLOOD LEFT FOREARM  Final   Special Requests   Final    BOTTLES DRAWN AEROBIC AND ANAEROBIC Blood Culture adequate volume   Culture   Final    NO GROWTH 4 DAYS Performed at [facility], [address], [place], [place] [address]    Report Status PENDING  Incomplete  Blood Culture (routine x 2)     Status: None (Preliminary result)   Collection Time: 05/23/19  9:28 PM  Result Value Ref Range Status   Specimen Description BLOOD LEFT ARM  Final   Special Requests   Final    BOTTLES DRAWN AEROBIC AND ANAEROBIC Blood Culture adequate volume   Culture   Final    NO GROWTH 4 DAYS Performed at [facility], [address], [place], [place] [address]    Report Status PENDING  Incomplete  SARS Coronavirus 2 (CEPHEID- Performed in [place] hospital lab), Hosp Order     Status: Abnormal   Collection Time: 05/23/19  9:35 PM  Result Value Ref Range Status   SARS Coronavirus 2 POSITIVE (A) NEGATIVE Final    Comment: RESULT CALLED TO, READ BACK BY  AND VERIFIED WITH: B OSORIO RN 05/23/19 2304 JDW (NOTE) If result is NEGATIVE SARS-CoV-2 target nucleic acids are NOT DETECTED. The SARS-CoV-2 RNA is generally detectable in upper and lower  respiratory specimens during the acute phase of infection. The lowest  concentration of SARS-CoV-2  viral copies this assay can detect is 250  copies / mL. A negative result does not preclude SARS-CoV-2 infection  and should not be used as the sole basis for treatment or other  patient management decisions.  A negative result may occur with  improper specimen collection / handling, submission of specimen other  than nasopharyngeal swab, presence of viral mutation(s) within the  areas targeted by this assay, and inadequate number of viral copies  (<250 copies / mL). A negative result must be combined with clinical  observations, patient history, and epidemiological information. If result is POSITIVE SARS-CoV-2 target nucleic acids are DETECTED. The SARS- CoV-2 RNA is generally detectable in upper and lower  respiratory specimens during the acute phase of infection.  Positive  results are indicative of active infection with SARS-CoV-2.  Clinical  correlation with patient history and other diagnostic information is  necessary to determine patient infection status.  Positive results do  not rule out bacterial infection or co-infection with other viruses. If result is PRESUMPTIVE POSTIVE SARS-CoV-2 nucleic acids MAY BE PRESENT.   A presumptive positive result was obtained on the submitted specimen  and confirmed on repeat testing.  While 2019 novel coronavirus  (SARS-CoV-2) nucleic acids may be present in the submitted sample  additional confirmatory testing may be necessary for epidemiological  and / or clinical management purposes  to differentiate between  SARS-CoV-2 and other Sarbecovirus currently known to infect humans.  If clinically indicated additional testing with an alternate test  methodology [phone]) is advised . The SARS-CoV-2 RNA is generally  detectable in upper and lower respiratory specimens during the acute  phase of infection. The expected result is Negative. Fact Sheet for Patients:  StrictlyIdeas.no Fact Sheet for Healthcare Providers:  BankingDealers.co.za This test is not yet approved or cleared by the [place] FDA and has been authorized for detection and/or diagnosis of SARS-CoV-2 by FDA under an Emergency Use Authorization (EUA).  This EUA will remain in effect (meaning this test can be used) for the duration of the COVID-19 declaration under Section 564(b)(1) of the Act, 21 U.S.C. section 360bbb-3(b)(1), unless the authorization is terminated or revoked sooner. Performed at [facility], [place] [address], [place], [place] [address]        Today   Subjective:   Chiyeko Ferre today has no headache,no chest abdominal pain,no new weakness tingling or numbness, feels much better wants to go home today.   Objective:   Blood pressure (!) 160/105, pulse 72, temperature (!) 97.5 F (36.4 C), temperature source Oral, resp. rate (!) 21, height 5\' 10"  (1.778 m), weight 77.1 kg, SpO2 93 %.   Intake/Output Summary (Last 24 hours) at 05/28/2019 1002 Last data filed at 05/28/2019 0523 Gross per 24 hour  Intake 960 ml  Output -  Net 960 ml    Exam Awake Alert, Oriented x 3.  Symmetrical Chest wall movement, Good air movement bilaterally, CTAB RRR,No Gallops,Rubs or new Murmurs, No Parasternal Heave +ve B.Sounds, Abd Soft, Non tender, No organomegaly appriciated, No rebound -guarding or rigidity. No Cyanosis, Clubbing or edema, No new Rash or bruise  Data Review   CBC w Diff:  Lab Results  Component Value Date   WBC  8.7 05/27/2019   HGB 14.2 05/27/2019   HCT 43.8 05/27/2019   PLT 220 05/27/2019   LYMPHOPCT 21 05/23/2019   MONOPCT 7 05/23/2019   EOSPCT 0 05/23/2019   BASOPCT 0 05/23/2019    CMP:  Lab Results  Component Value Date   NA 142 05/27/2019   NA 141 02/02/2019   K 3.9 05/27/2019   CL 104 05/27/2019   CO2 32 05/27/2019   BUN 22 (H) 05/27/2019   BUN 12 02/02/2019   CREATININE 0.72 05/27/2019   PROT 6.3 (L) 05/27/2019   ALBUMIN 3.6 05/27/2019   BILITOT 0.3  05/27/2019   ALKPHOS 56 05/27/2019   AST 20 05/27/2019   ALT 18 05/27/2019  .   Total Time in preparing paper work, data evaluation and todays exam - 36 minutes  Phillips Climes M.D on 05/28/2019 at 10:02 AM  [facility]   Office  [phone]

## 2019-05-28 NOTE — Discharge Instructions (Signed)
Person Under Monitoring Name: Madison Phillips  Location: [place] [address]   Infection Prevention Recommendations for Individuals Confirmed to have, or Being Evaluated for, 2019 Novel Coronavirus (COVID-19) Infection Who Receive Care at Home  Individuals who are confirmed to have, or are being evaluated for, COVID-19 should follow the prevention steps below until a healthcare provider or local or state health department says they can return to normal activities.  Stay home except to get medical care You should restrict activities outside your home, except for getting medical care. Do not go to work, school, or public areas, and do not use public transportation or taxis.  Call ahead before visiting your doctor Before your medical appointment, call the healthcare provider and tell them that you have, or are being evaluated for, COVID-19 infection. This will help the healthcare providers office take steps to keep other people from getting infected. Ask your healthcare provider to call the local or state health department.  Monitor your symptoms Seek prompt medical attention if your illness is worsening (e.g., difficulty breathing). Before going to your medical appointment, call the healthcare provider and tell them that you have, or are being evaluated for, COVID-19 infection. Ask your healthcare provider to call the local or state health department.  Wear a facemask You should wear a facemask that covers your nose and mouth when you are in the same room with other people and when you visit a healthcare provider. People who live with or visit you should also wear a facemask while they are in the same room with you.  Separate yourself from other people in your home As much as possible, you should stay in a different room from other people in your home. Also, you should use a separate bathroom, if available.  Avoid sharing household items You should not share  dishes, drinking glasses, cups, eating utensils, towels, bedding, or other items with other people in your home. After using these items, you should wash them thoroughly with soap and water.  Cover your coughs and sneezes Cover your mouth and nose with a tissue when you cough or sneeze, or you can cough or sneeze into your sleeve. Throw used tissues in a lined trash can, and immediately wash your hands with soap and water for at least 20 seconds or use an alcohol-based hand rub.  Wash your [facility] your hands often and thoroughly with soap and water for at least 20 seconds. You can use an alcohol-based hand sanitizer if soap and water are not available and if your hands are not visibly dirty. Avoid touching your eyes, nose, and mouth with unwashed hands.   Prevention Steps for Caregivers and Household Members of Individuals Confirmed to have, or Being Evaluated for, COVID-19 Infection Being Cared for in the Home  If you live with, or provide care at home for, a person confirmed to have, or being evaluated for, COVID-19 infection please follow these guidelines to prevent infection:  Follow healthcare providers instructions Make sure that you understand and can help the patient follow any healthcare provider instructions for all care.  Provide for the patients basic needs You should help the patient with basic needs in the home and provide support for getting groceries, prescriptions, and other personal needs.  Monitor the patients symptoms If they are getting sicker, call his or her medical provider and tell them that the patient has, or is being evaluated for, COVID-19 infection. This will help the healthcare providers office  take steps to keep other people from getting infected. Ask the healthcare provider to call the local or state health department.  Limit the number of people who have contact with the patient  If possible, have only one caregiver for the patient.  Other  household members should stay in another home or place of residence. If this is not possible, they should stay  in another room, or be separated from the patient as much as possible. Use a separate bathroom, if available.  Restrict visitors who do not have an essential need to be in the home.  Keep older adults, very young children, and other sick people away from the patient Keep older adults, very young children, and those who have compromised immune systems or chronic health conditions away from the patient. This includes people with chronic heart, lung, or kidney conditions, diabetes, and cancer.  Ensure good ventilation Make sure that shared spaces in the home have good air flow, such as from an air conditioner or an opened window, weather permitting.  Wash your hands often  Wash your hands often and thoroughly with soap and water for at least 20 seconds. You can use an alcohol based hand sanitizer if soap and water are not available and if your hands are not visibly dirty.  Avoid touching your eyes, nose, and mouth with unwashed hands.  Use disposable paper towels to dry your hands. If not available, use dedicated cloth towels and replace them when they become wet.  Wear a facemask and gloves  Wear a disposable facemask at all times in the room and gloves when you touch or have contact with the patients blood, body fluids, and/or secretions or excretions, such as sweat, saliva, sputum, nasal mucus, vomit, urine, or feces.  Ensure the mask fits over your nose and mouth tightly, and do not touch it during use.  Throw out disposable facemasks and gloves after using them. Do not reuse.  Wash your hands immediately after removing your facemask and gloves.  If your personal clothing becomes contaminated, carefully remove clothing and launder. Wash your hands after handling contaminated clothing.  Place all used disposable facemasks, gloves, and other waste in a lined container before  disposing them with other household waste.  Remove gloves and wash your hands immediately after handling these items.  Do not share dishes, glasses, or other household items with the patient  Avoid sharing household items. You should not share dishes, drinking glasses, cups, eating utensils, towels, bedding, or other items with a patient who is confirmed to have, or being evaluated for, COVID-19 infection.  After the person uses these items, you should wash them thoroughly with soap and water.  Wash laundry thoroughly  Immediately remove and wash clothes or bedding that have blood, body fluids, and/or secretions or excretions, such as sweat, saliva, sputum, nasal mucus, vomit, urine, or feces, on them.  Wear gloves when handling laundry from the patient.  Read and follow directions on labels of laundry or clothing items and detergent. In general, wash and dry with the warmest temperatures recommended on the label.  Clean all areas the individual has used often  Clean all touchable surfaces, such as counters, tabletops, doorknobs, bathroom fixtures, toilets, phones, keyboards, tablets, and bedside tables, every day. Also, clean any surfaces that may have blood, body fluids, and/or secretions or excretions on them.  Wear gloves when cleaning surfaces the patient has come in contact with.  Use a diluted bleach solution (e.g., dilute bleach with 1 part  bleach and 10 parts water) or a household disinfectant with a label that says EPA-registered for coronaviruses. To make a bleach solution at home, add 1 tablespoon of bleach to 1 quart (4 cups) of water. For a larger supply, add  cup of bleach to 1 gallon (16 cups) of water.  Read labels of cleaning products and follow recommendations provided on product labels. Labels contain instructions for safe and effective use of the cleaning product including precautions you should take when applying the product, such as wearing gloves or eye protection  and making sure you have good ventilation during use of the product.  Remove gloves and wash hands immediately after cleaning.  Monitor yourself for signs and symptoms of illness Caregivers and household members are considered close contacts, should monitor their health, and will be asked to limit movement outside of the home to the extent possible. Follow the monitoring steps for close contacts listed on the symptom monitoring form.   ? If you have additional questions, contact your local health department or call the epidemiologist on call at [phone] (available 24/7). ? This guidance is subject to change. For the most up-to-date guidance from [facility], please refer to their website: YouBlogs.pl

## 2019-05-30 ENCOUNTER — Other Ambulatory Visit (HOSPITAL_COMMUNITY): Payer: Self-pay | Admitting: *Deleted

## 2019-05-30 DIAGNOSIS — Z1231 Encounter for screening mammogram for malignant neoplasm of breast: Secondary | ICD-10-CM

## 2019-05-31 ENCOUNTER — Ambulatory Visit (HOSPITAL_COMMUNITY): Payer: Self-pay

## 2019-06-06 ENCOUNTER — Telehealth: Payer: Self-pay

## 2019-06-06 ENCOUNTER — Telehealth: Payer: Self-pay | Admitting: Family Medicine

## 2019-06-06 DIAGNOSIS — Z20822 Contact with and (suspected) exposure to covid-19: Secondary | ICD-10-CM

## 2019-06-06 NOTE — Telephone Encounter (Signed)
Called stating that the patient cant have a PFT test done unless she has a COVID test. Please follow up.

## 2019-06-06 NOTE — Telephone Encounter (Signed)
Please let [facility] know that I have placed an order for patient to be contacted to see if she can go for COVID testing tomorrow and if she cannot then please contact patient and reschedule her PFT's

## 2019-06-06 NOTE — Telephone Encounter (Signed)
See notes

## 2019-06-06 NOTE — Addendum Note (Signed)
Addended by: Dimple Nanas on: 06/06/2019 05:45 PM   Modules accepted: Orders

## 2019-06-06 NOTE — Telephone Encounter (Signed)
As per Abelino Derrick, [employer], she has spoke to the patient and the case has been closed.  They are working with the patient through another source

## 2019-06-06 NOTE — Telephone Encounter (Signed)
Spoke with Tira with [name] and she stated patient is scheduled for PFT on 06-11-19. Per Tira, their requirements is patient have to have a Negative Covid 19 test before she can come into the the facility due to her recent positive from 05-23-19.  Tira wants to know if patient is going to get scheduled for another Covid 19 test to see if she's negative before her appt with them 06-11-19. Staff informed Tira that provider that put in the order is currently not in the office but message will be sent to the lead doctor to get advise. Tira agreed and verbalized understanding and would like a call back [phone].

## 2019-06-06 NOTE — Telephone Encounter (Signed)
Patient returning call asking to be scheduled for COVID-19 testing towards the end of the month. Pt states that her PFT's has been rescheduled for 06/27/19. Pt scheduled for testing at [facility] site on 06/19/19. Pt advised to wear a mask and remain in car at appt time. Pt verbalized understanding.

## 2019-06-06 NOTE — Telephone Encounter (Signed)
Called patient and would like to resch her PFT it for July 8th area because she have plans. Per pt around July 1st she is willing to also do the Covid 19 retest. Per pt she will call the [facility] testing and let them know about her wanting to push out the date. Called [facility] testing and left message informing them with what patient stated and and office number was provided on their voicemail.

## 2019-06-06 NOTE — Telephone Encounter (Addendum)
Patient called, left VM to return call to [phone] between the hours 0700-1900 Monday through Friday to schedule covid testing.    ----- Message from [name], MD sent at 06/06/2019  4:44 PM EDT ----- Regarding: COVID test clearance Patient is status post COVID-19 infection on 05/23/2019 and is scheduled to have pulmonary function tests and tests cannot be performed unless patient with a negative COVID-19 test- can you try to contact patient and have her go to have COVID testing this Thursday, June 18th, otherwise the PFT's will need to be rescheduled. Thank you

## 2019-06-08 NOTE — Telephone Encounter (Signed)
Patient called to reschedule her Covid test to Monday, July 6th at 0800, she says she had other obligations that she forgot about. Appointment rescheduled.

## 2019-06-10 NOTE — Telephone Encounter (Signed)
I appreciate Dr Chapman Fitch helping with the COVID order.   This pt has an OV for Tuesday this week, this needs to be a telephone visit as she is POS for COVID

## 2019-06-11 ENCOUNTER — Inpatient Hospital Stay (HOSPITAL_COMMUNITY): Admission: RE | Admit: 2019-06-11 | Payer: Medicaid Other | Source: Ambulatory Visit

## 2019-06-11 NOTE — Progress Notes (Signed)
Patient ID: Madison Phillips, female   DOB: 1966/05/15, 53 y.o.   MRN: [id number] Virtual Visit via Telephone Note  I connected with Madison Phillips on 06/12/19 at  9:30 AM EDT by telephone and verified that I am speaking with the correct person using two identifiers.   Consent:  I discussed the limitations, risks, security and privacy concerns of performing an evaluation and management service by telephone and the availability of in person appointments. I also discussed with the patient that there may be a patient responsible charge related to this service. The patient expressed understanding and agreed to proceed.  Location of patient: The patient was at home  Location of provider: I was in my office  Persons participating in the televisit with the patient.   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  The patient was noted to have increased dizziness and mucus production and significant shortness of breath and went to the emergency room on 3 June and was found to have right lower lobe pneumonia and was COVID positive.  Other microbiologic data was unremarkable. The patient was treated with steroids and Remdesvir and subsequently discharged on tapering prednisone which she has finished.  The patient was discharged on 8 June.  Below is the discharge summary Admission date:  05/23/2019  Admitting Physician  Madison Patience, MD  Discharge Date:  05/28/2019   Primary MD  Madison Blackbird, MD  Recommendations for primary care physician for things to follow:  - please check CBC, BMP, LFTs , during next visit   Admission Diagnosis  Community acquired pneumonia of right lower lobe of lung ([place]) [J18.1] COVID-19 virus infection [U07.1] Pneumonia due to COVID-19 virus [U07.1, J12.89]   Discharge Diagnosis  Community  acquired pneumonia of right lower lobe of lung ([place]) [J18.1] COVID-19 virus infection [U07.1] Pneumonia due to COVID-19 virus [U07.1, J12.89]    Principal Problem:   Acute respiratory failure with hypoxia ([place]) Active Problems:   History of acoustic neuroma   COPD with emphysema ([place])   Pneumonia due to COVID-19 virus          Past Medical History:  Diagnosis Date  . COPD (chronic obstructive pulmonary disease) ([place])   . GERD (gastroesophageal reflux disease) 12/25/2018  . 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 Left ear hearing loss 12/05/2018    Past Surgical History:  Procedure Laterality Date  . cyber knife    . ectopic pregnanacy     History of present illness and  Hospital Course:     Kindly see H&P for history of present illness and admission details, please review complete Labs, Consult reports and Test reports for all details in brief  HPI  from the history and physical done on the day of admission 05/23/2019  QZE:SPQZRA G [place] a 53 y.o.femalewithhistory of COPD caustic neuroma presents to the ER because of increasing shortness of breath with some chest tightness x2 days. Also feeling increasingly dizzy. Dizziness is present even at rest increased on moving. Denies any focal deficit headache nausea vomiting abdominal pain.  ED Course:In the ER patient was febrile with temperature of 100.5 F heart rate was around 123 bpm EKG showing sinus tachycardia chest x-ray showing infiltrates and patient was hypoxic initially requiring 5 L oxygen to maintain sats. Blood cultures obtained started on empiric antibiotics  for community-acquired pneumonia. While in the ER patient had episode of diarrhea. Patient's COVID-19 test came positive and is being admitted for acute respiratory failure with COVID pneumonia.  Hospital Course   53 y.o.femalewithhistory of COPD ,Acaustic neuroma presents to the ER because of increasing shortness of breath with some  chest tightness x2 days. 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   Acuteon chronicrespiratory failure with hypoxia secondary to COVID pneumonia -patient has been placed on empiric antibiotics for community-acquired pneumonia, procalcitonin within normal limit, this is viral pneumonia, no indication for antibiotics, it was discontinued -Started on IV Solu-Medrol 05/24/2019,she will be discharged on  prednisone taper -Started on Remdesivir 05/24/2019, finished total 5 days. -did not receive  Actemra, giving inflammatory markers trending down, improving respiratory status -She is on 2 L upon exertion at baseline,initially she was requiring 2 L even at rest,this has improved, currently back to baseline. -Inflammatory markers trending down which is reassuring -Continue with vitamin C and zinc  COVID-19 Labs  RecentLabs(last2labs)       Recent Labs   05/25/19 0500 05/26/19 0230 05/27/19 0400  DDIMER 0.60* 0.48 0.57*  FERRITIN 189 171 154  CRP 4.9* 2.1* 1.7*      RecentLabs       Lab Results  Component Value Date   SARSCOV2NAA POSITIVE (A) 05/23/2019      COPD -No active wheezing, continue Combivent inhaler.   Dizziness -Resolved, no recurrence, she is following with neurology as an outpatient regarding acoustic schwannoma. - followed by Dr. Jannifer Franklin neurologist has had recent MRI 10 days ago. Appears nonfocal.  Diarrhea  - likely from [place]. -Resolved  Chest tightness likely from respiratory failure with secondary to COPD and hypoxia. Normal troponins, resolved  Since discharge the patient is markedly improved.  The patient shortness of breath is back at baseline.  She is on 2 L with saturations of 90%.  There is no chest pain or dizziness.  There is no  body aches.  She is staying hydrated.  She has had no further fever.  She did have a temperature of 100 degrees  4 days ago and since that time it has resolved.  The patient does plan to obtain pulmonary function test for disability determination on July 10 and will receive another COVID test on July 6  Constitutional:   No  weight loss, night sweats,  Fevers, chills, fatigue, lassitude. HEENT:   No headaches,  Difficulty swallowing,  Tooth/dental problems,  Sore throat,                No sneezing, itching, ear ache, nasal congestion, post nasal drip,   CV:  No chest pain,  Orthopnea, PND, swelling in lower extremities, anasarca, dizziness, palpitations  GI  No heartburn, indigestion, abdominal pain, nausea, vomiting, diarrhea, change in bowel habits, loss of appetite  Resp:  shortness of breath with exertion or at rest.  No excess mucus, productive cough,  No non-productive cough,  No coughing up of blood.  No change in color of mucus.  No wheezing.  No chest wall deformity  Skin: no rash or lesions.  GU: no dysuria, change in color of urine, no urgency or frequency.  No flank pain.  MS:  No joint pain or swelling.  No decreased range of motion.  No back pain.  Psych:  No change in mood or affect. depression or anxiety.  No memory loss.  Observations/Objective: No observations as this was a telephone visit  All lab data were reviewed from the electronic record  Assessment and Plan: #1 acute hypoxia secondary to COVID-19 tracheobronchitis and right lower lobe pneumonia now resolved  From my perspective the patient no longer needs to isolate given the fact that she is been symptom free and more than 10 days since onset of illness  The patient will receive a repeat COVID test on July 6 prior to receiving her pulmonary function studies on July 10  #2 COPD Gold stage D with chronic hypoxemic respiratory failure stable at this time  Will continue DuoNeb 4 times daily and receive refills  for the patient  Will continue to see if we can get Trelegy inhaler for this patient  Continue oxygen therapy as prescribed  Follow-up testing for pulmonary function studies      Follow Up Instructions: We will follow-up with pulmonary function study results and the patient knows her DuoNeb medicine will be sent to her home   I discussed the assessment and treatment plan with the patient. The patient was provided an opportunity to ask questions and all were answered. The patient agreed with the plan and demonstrated an understanding of the instructions.   The patient was advised to call back or seek an in-person evaluation if the symptoms worsen or if the condition fails to improve as anticipated.  I provided 25 minutes of non-face-to-face time during this encounter  including  median intraservice time , review of notes, labs, imaging, medications  and explaining diagnosis and management to the patient .    Asencion Noble, MD

## 2019-06-11 NOTE — Telephone Encounter (Signed)
Patient needs Televisit with PCP for COVID+ FU

## 2019-06-12 ENCOUNTER — Encounter: Payer: Self-pay | Admitting: Critical Care Medicine

## 2019-06-12 ENCOUNTER — Other Ambulatory Visit: Payer: Self-pay

## 2019-06-12 ENCOUNTER — Ambulatory Visit: Payer: Medicaid Other | Attending: Critical Care Medicine | Admitting: Critical Care Medicine

## 2019-06-12 DIAGNOSIS — J1289 Other viral pneumonia: Secondary | ICD-10-CM | POA: Diagnosis not present

## 2019-06-12 DIAGNOSIS — J1282 Pneumonia due to coronavirus disease 2019: Secondary | ICD-10-CM

## 2019-06-12 DIAGNOSIS — U071 COVID-19: Secondary | ICD-10-CM | POA: Diagnosis not present

## 2019-06-12 DIAGNOSIS — J9611 Chronic respiratory failure with hypoxia: Secondary | ICD-10-CM

## 2019-06-12 DIAGNOSIS — J432 Centrilobular emphysema: Secondary | ICD-10-CM | POA: Diagnosis not present

## 2019-06-12 MED ORDER — IPRATROPIUM-ALBUTEROL 0.5-2.5 (3) MG/3ML IN SOLN: 3.0000 mL | Freq: Four times a day (QID) | RESPIRATORY_TRACT | 2 refills | Status: DC

## 2019-06-12 MED FILL — IPRAT-ALBUT 0.5-3(2.5) MG/3: 0.5-2.5 (3) | 30 days supply | Qty: 360 | Fill #0

## 2019-06-12 NOTE — Progress Notes (Signed)
Med refill (wants it delivered)   Per pt she have not received the Trelegy yet  Per pt she is feeling better and do not have any Covid 19 sx.

## 2019-06-13 ENCOUNTER — Encounter (HOSPITAL_COMMUNITY): Payer: Medicaid Other

## 2019-06-18 MED FILL — TRELEGY ELLIPTA 100-62.5-25: 100-62.5-25 | 30 days supply | Qty: 60 | Fill #0

## 2019-06-18 MED FILL — TEGretol 200 MG TABS: 200 | 28 days supply | Qty: 42 | Fill #2

## 2019-06-19 ENCOUNTER — Other Ambulatory Visit: Payer: Medicaid Other

## 2019-06-20 DIAGNOSIS — J449 Chronic obstructive pulmonary disease, unspecified: Secondary | ICD-10-CM | POA: Diagnosis not present

## 2019-06-25 ENCOUNTER — Other Ambulatory Visit: Payer: Medicaid Other

## 2019-06-25 DIAGNOSIS — Z20822 Contact with and (suspected) exposure to covid-19: Secondary | ICD-10-CM

## 2019-06-25 DIAGNOSIS — R6889 Other general symptoms and signs: Secondary | ICD-10-CM | POA: Diagnosis not present

## 2019-06-28 ENCOUNTER — Ambulatory Visit (HOSPITAL_COMMUNITY): Payer: Self-pay

## 2019-06-29 ENCOUNTER — Other Ambulatory Visit: Payer: Self-pay

## 2019-06-29 ENCOUNTER — Ambulatory Visit (HOSPITAL_COMMUNITY)
Admission: RE | Admit: 2019-06-29 | Discharge: 2019-06-29 | Disposition: A | Discharge status: Home / Self Care | Payer: Medicaid Other | Source: Ambulatory Visit | Attending: Critical Care Medicine | Admitting: Critical Care Medicine

## 2019-06-29 DIAGNOSIS — J432 Centrilobular emphysema: Secondary | ICD-10-CM

## 2019-06-29 DIAGNOSIS — J9611 Chronic respiratory failure with hypoxia: Secondary | ICD-10-CM

## 2019-06-30 LAB — NOVEL CORONAVIRUS, NAA: SARS-CoV-2, NAA: NOT DETECTED

## 2019-07-04 ENCOUNTER — Ambulatory Visit (HOSPITAL_COMMUNITY)
Admission: RE | Admit: 2019-07-04 | Discharge: 2019-07-04 | Disposition: A | Discharge status: Home / Self Care | Payer: Medicaid Other | Source: Ambulatory Visit | Attending: Critical Care Medicine | Admitting: Critical Care Medicine

## 2019-07-04 ENCOUNTER — Other Ambulatory Visit: Payer: Self-pay

## 2019-07-04 DIAGNOSIS — J432 Centrilobular emphysema: Secondary | ICD-10-CM | POA: Diagnosis present

## 2019-07-04 DIAGNOSIS — J9611 Chronic respiratory failure with hypoxia: Secondary | ICD-10-CM | POA: Diagnosis not present

## 2019-07-04 LAB — PULMONARY FUNCTION TEST
FEF 25-75 Post: 0.29 L/sec
FEF 25-75 Pre: 0.27 L/sec
FEF2575-%Change-Post: 7 %
FEF2575-%Pred-Post: 10 %
FEF2575-%Pred-Pre: 9 %
FEV1-%Change-Post: 12 %
FEV1-%Pred-Post: 29 %
FEV1-%Pred-Pre: 26 %
FEV1-Post: 0.84 L
FEV1-Pre: 0.74 L
FEV1FVC-%Change-Post: 25 %
FEV1FVC-%Pred-Pre: 54 %
FEV6-%Change-Post: -5 %
FEV6-%Pred-Post: 42 %
FEV6-%Pred-Pre: 44 %
FEV6-Post: 1.45 L
FEV6-Pre: 1.53 L
FEV6FVC-%Change-Post: 5 %
FEV6FVC-%Pred-Post: 98 %
FEV6FVC-%Pred-Pre: 93 %
FVC-%Change-Post: -10 %
FVC-%Pred-Post: 42 %
FVC-%Pred-Pre: 47 %
FVC-Post: 1.51 L
FVC-Pre: 1.68 L
Post FEV1/FVC ratio: 55 %
Post FEV6/FVC ratio: 96 %
Pre FEV1/FVC ratio: 44 %
Pre FEV6/FVC Ratio: 91 %
RV % pred: 405 %
RV: 8.78 L
TLC % pred: 173 %
TLC: 10.33 L

## 2019-07-04 MED ORDER — ALBUTEROL SULFATE (2.5 MG/3ML) 0.083% IN NEBU
2.5000 mg | INHALATION_SOLUTION | Freq: Once | RESPIRATORY_TRACT | Status: AC
  Administered 2019-07-04: 2.5 mg via RESPIRATORY_TRACT

## 2019-07-06 ENCOUNTER — Inpatient Hospital Stay (HOSPITAL_COMMUNITY): Admission: RE | Admit: 2019-07-06 | Payer: Medicaid Other | Source: Ambulatory Visit

## 2019-07-16 NOTE — Progress Notes (Signed)
Subjective:    Patient ID: Madison Phillips, female    DOB: 1966/10/07, 53 y.o.   MRN: [id number]  52 y.o.F with COPD Gold stage D oxygen dependent.   past medical history significant for COPD, history of acoustic neuroma presenting  Pt is now non smoker since 2014  The patient just moved here from [place] a year ago.  She has no insurance.  She is had progressive dyspnea for the past year.  She has been in the emergency room and also hospitalized on several occasions since early December 2019.  She was also seen in our clinic December 05, 2018 and found to be severely hypoxic and thus sent to the emergency room for further evaluation.  She was subsequently admitted for several days and treated for COPD exacerbation discharged home only to return on 22 December 2017 for yet another COPD exacerbation she was given Ladona Ridgel and prednisone for which she did not fill and immediately return to the emergency room over the weekend on 12/24/2017.  She was admitted overnight and discharged early this morning.  She essentially is on no medications at home other than as needed gabapentin for her acoustic neuroma.  She has been on Brio inhaler along with Symbicort in the past neither which is really helping.  She previously did like Advair when she was in [place].  She is not smoke since 2014.  She states oral prednisone does help.  She has very significant anxiety component.  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  The patient was noted to have increased dizziness and mucus production and significant shortness of breath and went to the emergency room on 3 June and was found to have right lower lobe pneumonia and was COVID positive.  Other microbiologic data was unremarkable. The patient was treated with steroids and  Remdesvir and subsequently discharged on tapering prednisone which she has finished.  The patient was discharged on 8 June.  Below is the discharge summary Admission date:6/3/2020Admitting PhysicianArshad Aida Puffer, MD  Discharge Date:05/28/2019  Primary MDFulp, Cammie, MD  Recommendations for primary care physician for things to follow: - please check CBC, BMP, LFTs , during next visit   Admission DiagnosisCommunity acquired pneumonia of right lower lobe of lung ([place]) (J18.1) COVID-19 virus infection (U07.1) Pneumonia due to COVID-19 virus (U07.1, J12.89)   Discharge DiagnosisCommunity acquired pneumonia of right lower lobe of lung ([place]) (J18.1) COVID-19 virus infection (U07.1) Pneumonia due to COVID-19 virus (U07.1, J12.89)  Principal Problem: Acute respiratory failure with hypoxia ([place]) Active Problems: History of acoustic neuroma COPD with emphysema ([place]) Pneumonia due to COVID-19 virus  Since that visit and the hospitalization in June for COVID viral infection and pneumonia the patient has improved to some degree.  The patient is still severely dyspneic with minimal exertion.  There is productive cough.  The patient is using the flutter valve.  The patient has had repeat pulmonary function studies showing severe airway obstruction with minimal improvement with bronchodilation.  She still classified gold stage D COPD.  She still oxygen dependent does have a lighter weight oxygen system.  She still applying for disability at this time.  She is due up for a tetanus vaccine but would like to hold off on this at this time.  Note alpha one antitrypsin was NORMAL  Shortness of Breath This is a chronic problem. The current episode started more than 1 month ago. The problem occurs constantly. The problem has been rapidly worsening. Associated symptoms include chest pain, orthopnea, sputum production and wheezing. Pertinent negatives include no  ear pain, fever, headaches, hemoptysis, leg swelling, PND, rash, rhinorrhea, sore throat, swollen glands, syncope or vomiting. The symptoms are aggravated by any activity, animal exposure, lying flat, emotional upset, exercise, fumes, odors, pollens, occupational exposure, URIs, smoke and weather changes (mold in home, just cleaned out , works for [employer].  local office ). Associated symptoms comments: Severe dyspnea with talking  Chest is tight Mucus is yellow and white abd is bloating . Risk factors include smoking. She has tried beta agonist inhalers and oral steroids for the symptoms. The treatment provided moderate relief. Her past medical history is significant for COPD and pneumonia. There is no history of allergies, aspirin allergies, asthma, CAD, a heart failure or PE.    Past Medical History:  Diagnosis Date  . Acute respiratory failure with hypoxia ([place]) 05/24/2019  . COPD (chronic obstructive pulmonary disease) ([place])   . GERD (gastroesophageal reflux disease) 12/25/2018  . 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 Left ear hearing loss 12/05/2018  . Pneumonia due to COVID-19 virus 05/24/2019     Family History  Problem Relation Age of Onset  . Hypothyroidism Mother   . Diabetes Mellitus II Maternal Aunt   . Diabetes Maternal Aunt   . Cancer Father      Social History   Socioeconomic History  . Marital status: Single    Spouse name: Not on file  . Number of children: Not on file  . Years of education: Not on file  . Highest education level: Not on file  Occupational History  . Not on file  Social Needs  . Financial resource strain: Not on file  . Food insecurity    Worry: Not on file    Inability: Not on file  . Transportation needs    Medical: Not on file    Non-medical: Not on file  Tobacco Use  . Smoking status: Former Research scientist (life sciences)  . Smokeless tobacco: Never Used  Substance and Sexual Activity  . Alcohol use: Yes    Alcohol/week: 2.0 - 3.0 standard drinks    Types: 2 -  3 Cans of beer per week    Comment: a night  . Drug use: Not Currently  . Sexual activity: Yes  Lifestyle  . Physical activity    Days per week: Not on file    Minutes per session: Not on file  . Stress: Not on file  Relationships  . Social Herbalist on phone: Not on file    Gets together: Not on file    Attends religious service: Not on file    Active member of club or organization: Not on file    Attends meetings of clubs or organizations: Not on file    Relationship status: Not on file  . Intimate partner violence    Fear of current or ex partner: Not on file    Emotionally abused: Not on file    Physically abused: Not on file    Forced sexual activity: Not on file  Other Topics Concern  . Not on file  Social History Narrative  . Not on file     No Known Allergies   Outpatient Medications Prior to Visit  Medication Sig Dispense Refill  . albuterol (PROVENTIL) (2.5 MG/3ML)  0.083% nebulizer solution Take 3 mLs (2.5 mg total) by nebulization every 6 (six) hours as needed for wheezing or shortness of breath. 75 mL 12  . Fluticasone-Umeclidin-Vilant (TRELEGY ELLIPTA) 100-62.5-25 MCG/INH AEPB Inhale 1 puff into the lungs daily. 60 each 1  . ipratropium-albuterol (DUONEB) 0.5-2.5 (3) MG/3ML SOLN Take 3 mLs by nebulization 4 (four) times daily for 30 days. 360 mL 2  . OXYGEN Inhale into the lungs. 2L rest 3L exertion continuous    . vitamin C (VITAMIN C) 500 MG tablet Take 1 tablet (500 mg total) by mouth daily. Please take for 10 days    . zinc sulfate 220 (50 Zn) MG capsule Take 1 capsule (220 mg total) by mouth daily. Please take for 10 days    . carbamazepine (TEGRETOL) 200 MG tablet 1/2 tablet twice daily for 2 weeks then take 1 full tablet twice daily 45 tablet 3  . ibuprofen (ADVIL) 800 MG tablet Take 1 tablet (800 mg total) by mouth every 8 (eight) hours as needed. (Patient not taking: Reported on 07/17/2019) 30 tablet 1   No facility-administered medications  prior to visit.      Review of Systems  Constitutional: Positive for fatigue. Negative for fever.  HENT: Positive for sneezing and trouble swallowing. Negative for ear pain, mouth sores, nosebleeds, postnasal drip, rhinorrhea, sinus pressure, sinus pain and sore throat.   Eyes: Negative for pain, discharge, redness and itching.  Respiratory: Positive for cough, sputum production, chest tightness, shortness of breath and wheezing. Negative for hemoptysis.        Cough is dry, mucus is yellow  Cardiovascular: Positive for chest pain, palpitations and orthopnea. Negative for leg swelling, syncope and PND.  Gastrointestinal: Positive for diarrhea. Negative for blood in stool and vomiting.       Burp and gassy  Genitourinary:       Incontinence with cough  Skin: Negative for rash.  Neurological: Negative for headaches.  Hematological: Bruises/bleeds easily.  Psychiatric/Behavioral: Positive for sleep disturbance. Negative for self-injury and suicidal ideas. The patient is nervous/anxious.        Objective:   Physical Exam Vitals:   07/17/19 1117  BP: (!) 162/92  Pulse: 79  SpO2: 94%  Weight: 183 lb 12.8 oz (83.4 kg)  Height: 5\' 10"  (1.778 m)    Gen: Pleasant, well-nourished, in no distress, less Anxious affect  ENT: No lesions,  mouth clear,  oropharynx clear, no postnasal drip  Neck: No JVD, no TMG, no carotid bruits  Lungs: No use of accessory muscles, no dullness to percussion, distant BS, improved BS  Cardiovascular: RRR, heart sounds normal, no murmur or gallops, no peripheral edema  Abdomen: soft and NT, no HSM,  BS normal  Musculoskeletal: No deformities, no cyanosis or clubbing  Neuro: alert, non focal  Skin: Warm, no lesions or rashes  No results found.        BMP Latest Ref Rng & Units 05/27/2019 05/26/2019 05/25/2019  Glucose 70 - 99 mg/dL 69(L) 113(H) 119(H)  BUN 6 - 20 mg/dL 22(H) 20 16  Creatinine 0.44 - 1.00 mg/dL 0.72 0.67 0.69  BUN/Creat Ratio 9 - 23 - -  -  Sodium 135 - 145 mmol/L 142 140 143  Potassium 3.5 - 5.1 mmol/L 3.9 4.4 4.6  Chloride 98 - 111 mmol/L 104 105 105  CO2 22 - 32 mmol/L 32 29 32  Calcium 8.9 - 10.3 mg/dL 8.7(L) 8.4(L) 8.7(L)   Lab Results  Component Value Date   WBC 8.7  05/27/2019   HGB 14.2 05/27/2019   HCT 43.8 05/27/2019   MCV 97.1 05/27/2019   PLT 220 05/27/2019   EKG: LVH pattern  1/16 Study Conclusions  - Left ventricle: The cavity size was normal. Systolic function was   normal. The estimated ejection fraction was in the range of 60%   to 65%. Wall motion was normal; there were no regional wall   motion abnormalities. Left ventricular diastolic function   parameters were normal. - Aortic valve: Transvalvular velocity was within the normal range.   There was no stenosis. There was no regurgitation. - Mitral valve: Transvalvular velocity was within the normal range.   There was no evidence for stenosis. There was trivial   regurgitation. - Left atrium: The atrium was mildly dilated. - Right ventricle: The cavity size was normal. Wall thickness was   normal. Systolic function was normal. - Atrial septum: No defect or patent foramen ovale was identified. - Tricuspid valve: There was mild regurgitation. - Pulmonary arteries: Systolic pressure was within the normal   range. PA peak pressure: 25 mm Hg (S).  PFTS  01/10/19 COPD Stage 4 Gold   FeV1 27% predicted  RV 168% pred   TLC 88% predicted       Assessment & Plan:  I personally reviewed all images and lab data in the [facility] system as well as any outside material available during this office visit and agree with the  radiology impressions.   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 ([place]) Gold stage D COPD with centrilobular emphysema and chronic respiratory failure with hypoxemia  Pulmonary functions essentially unchanged from January 2020 and the patient clearly qualifies for disability  Plan refer the patient to maintain Trelegy 1  inhalation daily and continue albuterol nebulizer as needed and DuoNeb twice daily  Oxygenation continue with oxygen 2 L continuously  Chronic respiratory failure with hypoxia ([facility]) Chronic hypoxic respiratory failure with requiring oxygen at 3 L continuous   Madison Phillips was seen today for copd.  Diagnoses and all orders for this visit:  COPD with chronic bronchitis ([place])  Chronic respiratory failure with hypoxia ([place])  Centrilobular emphysema ([place])  Other orders -     carbamazepine (TEGRETOL) 200 MG tablet; 1 full tablet twice daily

## 2019-07-17 ENCOUNTER — Other Ambulatory Visit: Payer: Self-pay

## 2019-07-17 ENCOUNTER — Encounter: Payer: Self-pay | Admitting: Critical Care Medicine

## 2019-07-17 ENCOUNTER — Ambulatory Visit: Payer: Medicaid Other | Attending: Critical Care Medicine | Admitting: Critical Care Medicine

## 2019-07-17 VITALS — BP 162/92 | HR 79 | Ht 70.0 in | Wt 183.8 lb

## 2019-07-17 DIAGNOSIS — J432 Centrilobular emphysema: Secondary | ICD-10-CM | POA: Insufficient documentation

## 2019-07-17 DIAGNOSIS — Z87891 Personal history of nicotine dependence: Secondary | ICD-10-CM | POA: Diagnosis not present

## 2019-07-17 DIAGNOSIS — Z833 Family history of diabetes mellitus: Secondary | ICD-10-CM | POA: Diagnosis not present

## 2019-07-17 DIAGNOSIS — Z79899 Other long term (current) drug therapy: Secondary | ICD-10-CM | POA: Insufficient documentation

## 2019-07-17 DIAGNOSIS — Z9981 Dependence on supplemental oxygen: Secondary | ICD-10-CM | POA: Insufficient documentation

## 2019-07-17 DIAGNOSIS — J9611 Chronic respiratory failure with hypoxia: Secondary | ICD-10-CM | POA: Diagnosis not present

## 2019-07-17 DIAGNOSIS — J449 Chronic obstructive pulmonary disease, unspecified: Secondary | ICD-10-CM

## 2019-07-17 DIAGNOSIS — Z7951 Long term (current) use of inhaled steroids: Secondary | ICD-10-CM | POA: Diagnosis not present

## 2019-07-17 DIAGNOSIS — Z809 Family history of malignant neoplasm, unspecified: Secondary | ICD-10-CM | POA: Diagnosis not present

## 2019-07-17 MED ORDER — CARBAMAZEPINE 200 MG PO TABS: ORAL_TABLET | ORAL | 3 refills | Status: DC

## 2019-07-17 NOTE — Assessment & Plan Note (Signed)
Chronic hypoxic respiratory failure with requiring oxygen at 3 L continuous

## 2019-07-17 NOTE — Assessment & Plan Note (Signed)
Gold stage D COPD with centrilobular emphysema and chronic respiratory failure with hypoxemia  Pulmonary functions essentially unchanged from January 2020 and the patient clearly qualifies for disability  Plan refer the patient to maintain Trelegy 1 inhalation daily and continue albuterol nebulizer as needed and DuoNeb twice daily  Oxygenation continue with oxygen 2 L continuously

## 2019-07-17 NOTE — Patient Instructions (Addendum)
No change in medications  Schedule NURSE VISIT for A tetanus vaccine  Return 2 months

## 2019-07-21 DIAGNOSIS — J449 Chronic obstructive pulmonary disease, unspecified: Secondary | ICD-10-CM | POA: Diagnosis not present

## 2019-07-24 ENCOUNTER — Ambulatory Visit: Payer: Medicaid Other

## 2019-07-31 ENCOUNTER — Ambulatory Visit: Payer: Medicaid Other | Attending: Family Medicine | Admitting: *Deleted

## 2019-07-31 ENCOUNTER — Other Ambulatory Visit: Payer: Self-pay

## 2019-07-31 DIAGNOSIS — Z23 Encounter for immunization: Secondary | ICD-10-CM | POA: Diagnosis not present

## 2019-07-31 MED FILL — TRELEGY ELLIPTA 100-62.5-25: 100-62.5-25 | 30 days supply | Qty: 60 | Fill #1

## 2019-07-31 NOTE — Progress Notes (Signed)
Need for Tdap injection-

## 2019-08-02 ENCOUNTER — Other Ambulatory Visit: Payer: Self-pay | Admitting: Family Medicine

## 2019-08-02 DIAGNOSIS — Z1231 Encounter for screening mammogram for malignant neoplasm of breast: Secondary | ICD-10-CM

## 2019-08-21 DIAGNOSIS — J449 Chronic obstructive pulmonary disease, unspecified: Secondary | ICD-10-CM | POA: Diagnosis not present

## 2019-09-04 ENCOUNTER — Other Ambulatory Visit: Payer: Self-pay | Admitting: Critical Care Medicine

## 2019-09-04 MED FILL — IPRAT-ALBUT 0.5-3(2.5) MG/3: 0.5-2.5 (3) | 30 days supply | Qty: 360 | Fill #1

## 2019-09-04 MED FILL — TEGretol 200 MG TABS: 200 | 21 days supply | Qty: 42 | Fill #3

## 2019-09-04 MED FILL — TRELEGY ELLIPTA 100-62.5-25: 100-62.5-25 | 30 days supply | Qty: 60 | Fill #0

## 2019-09-10 ENCOUNTER — Ambulatory Visit
Admission: RE | Admit: 2019-09-10 | Discharge: 2019-09-10 | Disposition: A | Discharge status: Home / Self Care | Payer: Medicaid Other | Source: Ambulatory Visit | Attending: Family Medicine | Admitting: Family Medicine

## 2019-09-10 ENCOUNTER — Other Ambulatory Visit: Payer: Self-pay

## 2019-09-10 DIAGNOSIS — Z1231 Encounter for screening mammogram for malignant neoplasm of breast: Secondary | ICD-10-CM | POA: Diagnosis not present

## 2019-09-20 DIAGNOSIS — J449 Chronic obstructive pulmonary disease, unspecified: Secondary | ICD-10-CM | POA: Diagnosis not present

## 2019-10-02 ENCOUNTER — Encounter: Payer: Self-pay | Admitting: Neurology

## 2019-10-02 ENCOUNTER — Telehealth: Payer: Self-pay | Admitting: Neurology

## 2019-10-02 ENCOUNTER — Ambulatory Visit: Payer: Medicaid Other | Admitting: Neurology

## 2019-10-02 NOTE — Telephone Encounter (Signed)
This patient canceled the same day of a revisit appointment. 

## 2019-10-04 ENCOUNTER — Telehealth: Payer: Self-pay | Admitting: Neurology

## 2019-10-04 ENCOUNTER — Ambulatory Visit: Payer: Medicaid Other | Admitting: Neurology

## 2019-10-04 ENCOUNTER — Encounter: Payer: Self-pay | Admitting: Neurology

## 2019-10-04 NOTE — Telephone Encounter (Signed)
This is the second no-show for revisit, the patient canceled same day of revisit, first event occurred on 02 October 2019.

## 2019-10-05 ENCOUNTER — Telehealth: Payer: Self-pay | Admitting: Family Medicine

## 2019-10-05 NOTE — Telephone Encounter (Signed)
New Message   Pt came in to the office stating she wants a recommendation for an orthopedic doctor. Pt does not need a referral. Please f/u

## 2019-10-05 NOTE — Telephone Encounter (Signed)
Please find out what joint/area of the body that she is having issues as there may be different recommendations based on the area needing treatment

## 2019-10-09 ENCOUNTER — Other Ambulatory Visit (HOSPITAL_BASED_OUTPATIENT_CLINIC_OR_DEPARTMENT_OTHER): Payer: Medicaid Other | Admitting: Family Medicine

## 2019-10-09 DIAGNOSIS — M25562 Pain in left knee: Secondary | ICD-10-CM

## 2019-10-09 NOTE — Progress Notes (Signed)
Patient ID: Madison Phillips, female   DOB: [birth year], 53 y.o.   MRN: [id number]   Patient left message regarding left knee pain for which she would like referral to Orthopedics- referral placed as patient did not wish to have office visit

## 2019-10-09 NOTE — Telephone Encounter (Signed)
resending

## 2019-10-09 NOTE — Telephone Encounter (Signed)
Patient aware that referral was placed.

## 2019-10-09 NOTE — Telephone Encounter (Signed)
She has concerns with popping in the groin area on and Left knee, For 1 month.  When she stands she feels a pop. Feels like she needs an Xray. She refuses to come in for an OV. States she only needs the referral.

## 2019-10-09 NOTE — Telephone Encounter (Signed)
Orthopedic referral placed

## 2019-10-18 MED FILL — TRELEGY ELLIPTA 100-62.5-25: 100-62.5-25 | 30 days supply | Qty: 60 | Fill #1

## 2019-10-21 DIAGNOSIS — J449 Chronic obstructive pulmonary disease, unspecified: Secondary | ICD-10-CM | POA: Diagnosis not present

## 2019-10-22 ENCOUNTER — Other Ambulatory Visit: Payer: Self-pay | Admitting: Radiology

## 2019-10-22 ENCOUNTER — Ambulatory Visit (INDEPENDENT_AMBULATORY_CARE_PROVIDER_SITE_OTHER): Payer: Medicaid Other

## 2019-10-22 ENCOUNTER — Encounter: Payer: Self-pay | Admitting: Physician Assistant

## 2019-10-22 ENCOUNTER — Ambulatory Visit (INDEPENDENT_AMBULATORY_CARE_PROVIDER_SITE_OTHER): Payer: Medicaid Other | Admitting: Physician Assistant

## 2019-10-22 DIAGNOSIS — M79605 Pain in left leg: Secondary | ICD-10-CM | POA: Diagnosis not present

## 2019-10-22 MED ORDER — ALPRAZOLAM 0.25 MG PO TABS: 0.2500 mg | ORAL_TABLET | ORAL | 0 refills | Status: DC

## 2019-10-22 NOTE — Progress Notes (Signed)
Office Visit Note   Patient: Madison Phillips           Date of Birth: 03/12/1966           MRN: [id number] Visit Date: 10/22/2019              Requested by: [name], MD [place],  [place] [id number] PCP: [name], MD   Assessment & Plan: Visit Diagnoses:  1. Pain in left leg     Plan: Due to the fact the patient has significant pain in the left hip and findings on radiographs recommend MRI to better evaluate the left hip.  Will obtain an MRI of the left hip to rule out AVN.  Have her follow-up after the MRI to go over results discuss further treatment.  Due to her claustrophobia we will send Xanax in for her to take prior to the MRI.  Follow-Up Instructions: Return for After MRI.   Orders:  Orders Placed This Encounter  Procedures  . XR HIP UNILAT W OR W/O PELVIS 2-3 VIEWS LEFT  . XR Knee 1-2 Views Left   No orders of the defined types were placed in this encounter.     Procedures: No procedures performed   Clinical Data: No additional findings.   Subjective: Chief Complaint  Patient presents with  . Left Knee - Pain  . Left Leg - Pain    HPI Madison Phillips 53 year old female were seen for the first time due to left leg pain knee pain and groin pain.  Pain is been ongoing for at least a month.  No known injury.  Denies any back pain.  States pain is constant when standing or ambulating.  No pain at rest.  States pain is 7 out of 10 pain most of the time.  She notes whenever she first gets up she has to wait until the hip snaps and then she is able to walk.  Denies any giving way of the hip.  She denies any waking pain.  She is using no assistive device.  Denies any numbness tingling down the leg.  No difficulty donning shoes or socks. Review of Systems Negative for fevers or chills . Please see HPI otherwise negative.   Objective: Vital Signs: There were no vitals taken for this visit.  Physical Exam Constitutional:      Appearance:  She is normal weight. She is not ill-appearing or diaphoretic.  Pulmonary:     Effort: Pulmonary effort is normal.  Neurological:     Mental Status: She is alert and oriented to person, place, and time.  Psychiatric:        Mood and Affect: Mood normal.     Ortho Exam Bilateral knees excellent range of motion without pain.  Patellofemoral crepitus both knees right greater than left.  No instability valgus varus stressing of either knee.  No tenderness along medial lateral joint line of either knee.  Right hip excellent range of motion without pain.  Left hip pain with internal rotation.  No pain with external rotation.  Full range of motion of both hips.  Specialty Comments:  No specialty comments available.  Imaging: Xr Hip Unilat W Or W/o Pelvis 2-3 Views Left  Result Date: 10/22/2019 AP pelvis and lateral view of the left hip: No acute fractures.  Both hips well located.  Flattening of the left femoral head and cystic changes.  Right hip well-maintained.  Joint space well maintained both hips.  Xr Knee  1-2 Views Left  Result Date: 10/22/2019 Bilateral knees AP lateral views: No acute findings.  Knee joints are overall well-maintained.    PMFS History: Patient Active Problem List   Diagnosis Date Noted  . Centrilobular emphysema ([place]) 07/17/2019  . Chronic respiratory failure with hypoxia ([place]) 01/23/2019  . Hypertension 12/25/2018  . GERD (gastroesophageal reflux disease) 12/25/2018  . Left ear hearing loss 12/05/2018  . 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 Varicose veins of left lower extremity 12/05/2018  . 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 ([place]) 12/05/2018   Past Medical History:  Diagnosis Date  . Acute respiratory failure with hypoxia ([place]) 05/24/2019  . COPD (chronic obstructive pulmonary disease) ([place])   . GERD (gastroesophageal reflux disease) 12/25/2018  . 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 Left ear hearing loss 12/05/2018  . Pneumonia  due to COVID-19 virus 05/24/2019    Family History  Problem Relation Age of Onset  . Hypothyroidism Mother   . Diabetes Mellitus II Maternal Aunt   . Diabetes Maternal Aunt   . Cancer Father   . Breast cancer Neg Hx     Past Surgical History:  Procedure Laterality Date  . cyber knife    . ectopic pregnanacy     Social History   Occupational History  . Not on file  Tobacco Use  . Smoking status: Former Research scientist (life sciences)  . Smokeless tobacco: Never Used  Substance and Sexual Activity  . Alcohol use: Yes    Alcohol/week: 2.0 - 3.0 standard drinks    Types: 2 - 3 Cans of beer per week    Comment: a night  . Drug use: Not Currently  . Sexual activity: Yes

## 2019-11-20 DIAGNOSIS — J449 Chronic obstructive pulmonary disease, unspecified: Secondary | ICD-10-CM | POA: Diagnosis not present

## 2019-11-25 NOTE — Progress Notes (Signed)
Patient ID: Madison Phillips, female   DOB: Jul 26, 1966, 53 y.o.   MRN: [id number] Virtual Visit via Video Note  I connected with@ on 11/25/19 at@ by a video enabled telemedicine application and verified that I am speaking with the correct person using two identifiers.   Consent:  I discussed the limitations, risks, security and privacy concerns of performing an evaluation and management service by video visit and the availability of in person appointments. I also discussed with the patient that there may be a patient responsible charge related to this service. The patient expressed understanding and agreed to proceed.  Location of patient: Patient was at home  Location of provider: I was in my office  Persons participating in the televisit with the patient.   No one else on the call    History of Present Illness: This was a video visit and we began the call however because of static on the patient's computer we switched to a telephone visit at the end.  I was able to visualize the patient initially through the video and she was in no distress.  Note since the last visit the patient has received disability.  She is on the Trelegy and has helped as well.  She is not using oxygen as much she is having and much improved shortness of breath.  She has no real cough or chest discomfort.  No other new complaints at this time.    Observations/Objective: The patient was seen on the video was in no acute distress  Assessment and Plan: #1 COPD Gold stage D with centrilobular emphysema now improved on Trelegy  Will send refills of the Trelegy into the office today and continue albuterol as needed using a ProAir inhaler which she prefers  Follow Up Instructions: The patient knows an in office exam will be scheduled later in January   I discussed the assessment and treatment plan with the patient. The patient was provided an opportunity to ask questions and all were answered. The patient agreed with  the plan and demonstrated an understanding of the instructions.   The patient was advised to call back or seek an in-person evaluation if the symptoms worsen or if the condition fails to improve as anticipated.  I provided 15 minutes of non-face-to-face time during this encounter  including  median intraservice time , review of notes, labs, imaging, medications  and explaining diagnosis and management to the patient .    Asencion Noble, MD

## 2019-11-26 ENCOUNTER — Other Ambulatory Visit: Payer: Self-pay | Admitting: Family Medicine

## 2019-11-26 ENCOUNTER — Ambulatory Visit
Admission: RE | Admit: 2019-11-26 | Discharge: 2019-11-26 | Disposition: A | Discharge status: Home / Self Care | Payer: Medicaid Other | Source: Ambulatory Visit | Attending: Physician Assistant | Admitting: Physician Assistant

## 2019-11-26 ENCOUNTER — Other Ambulatory Visit: Payer: Self-pay

## 2019-11-26 ENCOUNTER — Encounter: Payer: Self-pay | Admitting: Critical Care Medicine

## 2019-11-26 ENCOUNTER — Ambulatory Visit: Payer: Medicaid Other | Attending: Critical Care Medicine | Admitting: Critical Care Medicine

## 2019-11-26 DIAGNOSIS — J432 Centrilobular emphysema: Secondary | ICD-10-CM | POA: Diagnosis not present

## 2019-11-26 DIAGNOSIS — J449 Chronic obstructive pulmonary disease, unspecified: Secondary | ICD-10-CM

## 2019-11-26 DIAGNOSIS — J9611 Chronic respiratory failure with hypoxia: Secondary | ICD-10-CM | POA: Diagnosis not present

## 2019-11-26 DIAGNOSIS — M79605 Pain in left leg: Secondary | ICD-10-CM

## 2019-11-26 DIAGNOSIS — M1612 Unilateral primary osteoarthritis, left hip: Secondary | ICD-10-CM | POA: Diagnosis not present

## 2019-11-26 MED ORDER — TRELEGY ELLIPTA 100-62.5-25 MCG/INH IN AEPB: 1.0000 | INHALATION_SPRAY | Freq: Every day | RESPIRATORY_TRACT | 11 refills | Status: DC

## 2019-11-26 MED ORDER — PROAIR RESPICLICK 108 (90 BASE) MCG/ACT IN AEPB: 2.0000 | INHALATION_SPRAY | Freq: Four times a day (QID) | RESPIRATORY_TRACT | 4 refills | Status: DC | PRN

## 2019-11-26 MED FILL — TEGretol 200 MG TABS: 200 | 21 days supply | Qty: 42 | Fill #4

## 2019-11-26 MED FILL — PROAIR HFA 90 MCG INHALER: 108 (90 BAS | 25 days supply | Qty: 9 | Fill #0

## 2019-11-26 MED FILL — IPRAT-ALBUT 0.5-3(2.5) MG/3: 0.5-2.5 (3) | 30 days supply | Qty: 360 | Fill #2

## 2019-11-26 MED FILL — TRELEGY ELLIPTA 100-62.5-25: 100-62.5-25 | 30 days supply | Qty: 60 | Fill #0

## 2019-11-27 ENCOUNTER — Other Ambulatory Visit: Payer: Medicaid Other

## 2019-11-29 ENCOUNTER — Ambulatory Visit: Payer: Medicaid Other | Admitting: Physician Assistant

## 2019-12-04 ENCOUNTER — Telehealth: Payer: Self-pay | Admitting: Family Medicine

## 2019-12-04 NOTE — Telephone Encounter (Signed)
Patient came in requesting a referral to the Optometrist. Please f/u with patient.

## 2019-12-05 ENCOUNTER — Ambulatory Visit: Payer: Medicaid Other | Admitting: Physician Assistant

## 2019-12-05 ENCOUNTER — Other Ambulatory Visit: Payer: Self-pay | Admitting: Family Medicine

## 2019-12-05 DIAGNOSIS — Z135 Encounter for screening for eye and ear disorders: Secondary | ICD-10-CM

## 2019-12-05 NOTE — Telephone Encounter (Signed)
Informed patient with what provider stated and she verbalized understanding.  

## 2019-12-05 NOTE — Telephone Encounter (Signed)
Referral placed and patient should call the office if she has not heard anything back regarding her referral in the next 2 weeks

## 2019-12-05 NOTE — Telephone Encounter (Signed)
Per pt it's just to get a check up on her eyes due to not having them checked for a while now and per pt she's getting older that's why she wanted the referral to the eye doctor.

## 2019-12-05 NOTE — Telephone Encounter (Signed)
Please find out what issue patient is having with her vision

## 2019-12-05 NOTE — Progress Notes (Signed)
Patient ID: Madison Phillips, female   DOB: [birth year], 53 y.o.   MRN: [id number]   53 yo female who called to request Optometry referral as she has not seen an eye specialist in awhile.

## 2019-12-21 DIAGNOSIS — J449 Chronic obstructive pulmonary disease, unspecified: Secondary | ICD-10-CM | POA: Diagnosis not present

## 2019-12-27 MED FILL — TRELEGY ELLIPTA 100-62.5-25: 100-62.5-25 | 90 days supply | Qty: 180 | Fill #1

## 2020-01-02 ENCOUNTER — Encounter: Payer: Self-pay | Admitting: Physician Assistant

## 2020-01-02 ENCOUNTER — Ambulatory Visit (INDEPENDENT_AMBULATORY_CARE_PROVIDER_SITE_OTHER): Payer: Medicaid Other | Admitting: Physician Assistant

## 2020-01-02 ENCOUNTER — Encounter: Payer: Self-pay | Admitting: Critical Care Medicine

## 2020-01-02 ENCOUNTER — Other Ambulatory Visit: Payer: Self-pay

## 2020-01-02 ENCOUNTER — Ambulatory Visit: Payer: Medicaid Other | Attending: Critical Care Medicine | Admitting: Critical Care Medicine

## 2020-01-02 VITALS — BP 136/85 | HR 83 | Temp 98.7°F | Wt 188.0 lb

## 2020-01-02 DIAGNOSIS — Z87891 Personal history of nicotine dependence: Secondary | ICD-10-CM | POA: Insufficient documentation

## 2020-01-02 DIAGNOSIS — Z7951 Long term (current) use of inhaled steroids: Secondary | ICD-10-CM | POA: Insufficient documentation

## 2020-01-02 DIAGNOSIS — Z833 Family history of diabetes mellitus: Secondary | ICD-10-CM | POA: Diagnosis not present

## 2020-01-02 DIAGNOSIS — J9611 Chronic respiratory failure with hypoxia: Secondary | ICD-10-CM | POA: Diagnosis not present

## 2020-01-02 DIAGNOSIS — Z79899 Other long term (current) drug therapy: Secondary | ICD-10-CM | POA: Insufficient documentation

## 2020-01-02 DIAGNOSIS — H918X2 Other specified hearing loss, left ear: Secondary | ICD-10-CM | POA: Diagnosis not present

## 2020-01-02 DIAGNOSIS — J449 Chronic obstructive pulmonary disease, unspecified: Secondary | ICD-10-CM | POA: Diagnosis not present

## 2020-01-02 DIAGNOSIS — M1612 Unilateral primary osteoarthritis, left hip: Secondary | ICD-10-CM

## 2020-01-02 DIAGNOSIS — J432 Centrilobular emphysema: Secondary | ICD-10-CM | POA: Diagnosis not present

## 2020-01-02 DIAGNOSIS — Z8616 Personal history of COVID-19: Secondary | ICD-10-CM | POA: Diagnosis not present

## 2020-01-02 DIAGNOSIS — Z8349 Family history of other endocrine, nutritional and metabolic diseases: Secondary | ICD-10-CM | POA: Diagnosis not present

## 2020-01-02 DIAGNOSIS — J439 Emphysema, unspecified: Secondary | ICD-10-CM | POA: Diagnosis present

## 2020-01-02 DIAGNOSIS — K219 Gastro-esophageal reflux disease without esophagitis: Secondary | ICD-10-CM | POA: Diagnosis not present

## 2020-01-02 NOTE — Progress Notes (Signed)
recertification for family medical supply for oxygen supply  Feels more SOB with walking today.

## 2020-01-02 NOTE — Progress Notes (Signed)
HPI: Mrs. Rackard returns today to go over the MRI of her left hip.  She states she is continues to have left hip and groin pain.  Pains became worse since her last visit.  She notes catching popping in the hip that gives way.  She is tried Tylenol without any relief. MRI is reviewed with the patient actual images reviewed with the patient.  MRI of the left hip shows moderate left hip arthritis with degeneration and tearing of the anterior labrum.  No acute fractures no AVN.  Physical exam: General well-developed well-nourished female no acute distress.  Ambulates without any assistive device with an antalgic gait. Bilateral hips: Good range of motion of right hip without pain.  Left hip she has significant pain with attempts of internal rotation.  Impression: Left hip osteoarthritis with degenerative labrum.  Plan: This point time would recommend a intra-articular injection of the left hip.  We will schedule this with Dr. Junius Roads near future under ultrasound.  Like to see her back 1 month after the injection to see what type of response she had to this.  Did discuss with her she did have an injection she have to wait at least 3 months prior to having hip replacement which I believe at some point time in the future she most likely will need.

## 2020-01-02 NOTE — Patient Instructions (Signed)
No change in medications. Return in   3 months 

## 2020-01-03 ENCOUNTER — Telehealth: Payer: Self-pay | Admitting: Radiology

## 2020-01-03 NOTE — Assessment & Plan Note (Signed)
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

## 2020-01-03 NOTE — Telephone Encounter (Signed)
FYI - patient needs US guided left hip injection, patient was unable to wait to be worked in during office visit with Artis Delay yesterday.  Looks like patient has been schedule on 01/09/20 with Dr. Junius Roads for injection during her check out yesterday.

## 2020-01-03 NOTE — Progress Notes (Signed)
Subjective:    Patient ID: Madison Phillips, female    DOB: [birth year], 54 y.o.   MRN: [id number]  This is a 54 year old female with gold stage D COPD with chronic hypoxic respiratory failure and centrilobular emphysema.  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 Since the last visit in December which was a video visit the patient has improved on Trelegy.  The patient is maintaining oxygen 2 L at rest exertion oxygen off a when she sat.  Patient denies lower extremity edema or headaches.  Patient is no longer smoking at this time is up-to-date on all her vaccines  The patient has been compliant with her oxygen in today's visit in part is to recertify her for her oxygen therapy  Shortness of Breath This is a chronic problem. The current episode started more than 1 year ago. The problem occurs daily. The problem has been gradually improving. Pertinent negatives include no abdominal pain, chest pain, claudication, coryza, ear pain, fever, headaches, hemoptysis, leg pain, leg swelling, neck pain, orthopnea, PND, rash, rhinorrhea, sore throat, sputum production, swollen glands, syncope, vomiting or wheezing. The symptoms are aggravated by emotional upset, exercise, any activity, odors, fumes and lying flat. Risk factors include smoking. She has tried beta agonist inhalers, steroid inhalers and ipratropium inhalers for the symptoms. Her past medical history is significant for COPD.   Past Medical History:  Diagnosis Date  . Acute respiratory failure with hypoxia ([place]) 05/24/2019  . COPD (chronic obstructive pulmonary disease) ([place])   . GERD (gastroesophageal reflux disease) 12/25/2018  . 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 Left ear hearing loss 12/05/2018  . Pneumonia due to COVID-19 virus 05/24/2019     Family History  Problem Relation Age of Onset  . Hypothyroidism Mother   . Diabetes Mellitus II Maternal Aunt   . Diabetes Maternal Aunt   .  Cancer Father   . Breast cancer Neg Hx      Social History   Socioeconomic History  . Marital status: Single    Spouse name: Not on file  . Number of children: Not on file  . Years of education: Not on file  . Highest education level: Not on file  Occupational History  . Not on file  Tobacco Use  . Smoking status: Former Research scientist (life sciences)  . Smokeless tobacco: Never Used  Substance and Sexual Activity  . Alcohol use: Yes    Alcohol/week: 2.0 - 3.0 standard drinks    Types: 2 - 3 Cans of beer per week    Comment: a night  . Drug use: Not Currently  . Sexual activity: Yes  Other Topics Concern  . Not on file  Social History Narrative  . Not on file   Social Determinants of Health   Financial Resource Strain:   . Difficulty of Paying Living Expenses: Not on file  Food Insecurity:   . Worried About Charity fundraiser in the Last Year: Not on file  . Ran Out of Food in the Last Year: Not on file  Transportation Needs:   . Lack of Transportation (Medical): Not on file  . Lack of Transportation (Non-Medical): Not on file  Physical Activity:   . Days of Exercise per Week: Not on file  . Minutes of Exercise per Session: Not on file  Stress:   . Feeling of Stress : Not on file  Social Connections:   . Frequency of Communication with Friends and Family:  Not on file  . Frequency of Social Gatherings with Friends and Family: Not on file  . Attends Religious Services: Not on file  . Active Member of Clubs or Organizations: Not on file  . Attends Archivist Meetings: Not on file  . Marital Status: Not on file  Intimate Partner Violence:   . Fear of Current or Ex-Partner: Not on file  . Emotionally Abused: Not on file  . Physically Abused: Not on file  . Sexually Abused: Not on file     No Known Allergies   Outpatient Medications Prior to Visit  Medication Sig Dispense Refill  . albuterol (PROVENTIL) (2.5 MG/3ML) 0.083% nebulizer solution Take 3 mLs (2.5 mg total) by  nebulization every 6 (six) hours as needed for wheezing or shortness of breath. 75 mL 12  . Albuterol Sulfate (PROAIR RESPICLICK) 123XX123 (90 Base) MCG/ACT AEPB Inhale 2 puffs into the lungs every 6 (six) hours as needed. 1 each 4  . carbamazepine (TEGRETOL) 200 MG tablet 1 full tablet twice daily 45 tablet 3  . Fluticasone-Umeclidin-Vilant (TRELEGY ELLIPTA) 100-62.5-25 MCG/INH AEPB Take 1 puff by mouth daily. 60 each 11  . OXYGEN Inhale into the lungs. 2L rest 3L exertion continuous    . vitamin C (VITAMIN C) 500 MG tablet Take 1 tablet (500 mg total) by mouth daily. Please take for 10 days    . zinc sulfate 220 (50 Zn) MG capsule Take 1 capsule (220 mg total) by mouth daily. Please take for 10 days    . ipratropium-albuterol (DUONEB) 0.5-2.5 (3) MG/3ML SOLN Take 3 mLs by nebulization 4 (four) times daily for 30 days. 360 mL 2  . ALPRAZolam (XANAX) 0.25 MG tablet Take 1 tablet (0.25 mg total) by mouth as directed for 1 dose. Take 15 minutes prior to MRI (Patient not taking: Reported on 01/02/2020) 1 tablet 0   No facility-administered medications prior to visit.      Review of Systems  Constitutional: Negative for fever.  HENT: Negative for ear pain, rhinorrhea and sore throat.   Respiratory: Positive for shortness of breath. Negative for hemoptysis, sputum production and wheezing.   Cardiovascular: Negative for chest pain, orthopnea, claudication, leg swelling, syncope and PND.  Gastrointestinal: Negative for abdominal pain and vomiting.  Musculoskeletal: Negative for neck pain.  Skin: Negative for rash.  Neurological: Negative for headaches.       Objective:   Physical Exam Vitals:   01/02/20 0920 01/02/20 0930  BP: (!) 156/105 136/85  Pulse: 83   Temp: 98.7 F (37.1 C)   TempSrc: Oral   SpO2: 93%   Weight: 188 lb (85.3 kg)     Gen: Pleasant, well-nourished, in no distress,  normal affect  ENT: No lesions,  mouth clear,  oropharynx clear, no postnasal drip  Neck: No JVD, no  TMG, no carotid bruits  Lungs: No use of accessory muscles, no dullness to percussion, distant breath sounds with prolonged expiratory phase  Cardiovascular: RRR, heart sounds normal, no murmur or gallops, no peripheral edema  Abdomen: soft and NT, no HSM,  BS normal  Musculoskeletal: No deformities, no cyanosis or clubbing  Neuro: alert, non focal  Skin: Warm, no lesions or rashes  No results found.   Ambulatory pulse ox is done room air at rest the patient was 91% room air during exertion around the office 1 lap she dropped to 87% pulse ox on 3 L she was up to 94% on 2 L she was at 91% with exertion  Assessment & Plan:  I personally reviewed all images and lab data in the [facility] system as well as any outside material available during this office visit and agree with the  radiology impressions.   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 ([place]) Chronic obstructive lung disease with emphysematous component Gold stage D  Patient is now out of her portable oxygen she will need to continue 2 L sleep 3 L with exertion she may take the oxygen off for an hour and she is at rest  The patient will continue the Trelegy inhaler and albuterol nebulizer as needed  Chronic respiratory failure with hypoxia ([place]) Patient has requalified for oxygen therapy and will need to continue 2 L during sleep 3 L with exertion and may be on room air at rest for brief intervals   Nadina was seen today for follow-up.  Diagnoses and all orders for this visit:  Centrilobular emphysema ([place])  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 ([place])  Chronic respiratory failure with hypoxia ([place])

## 2020-01-03 NOTE — Assessment & Plan Note (Signed)
Patient has requalified for oxygen therapy and will need to continue 2 L during sleep 3 L with exertion and may be on room air at rest for brief intervals

## 2020-01-04 NOTE — Telephone Encounter (Signed)
Noted  

## 2020-01-09 ENCOUNTER — Other Ambulatory Visit: Payer: Self-pay

## 2020-01-09 ENCOUNTER — Ambulatory Visit: Payer: Self-pay

## 2020-01-09 ENCOUNTER — Ambulatory Visit (INDEPENDENT_AMBULATORY_CARE_PROVIDER_SITE_OTHER): Payer: Medicaid Other | Admitting: Family Medicine

## 2020-01-09 ENCOUNTER — Encounter: Payer: Self-pay | Admitting: Family Medicine

## 2020-01-09 DIAGNOSIS — M1612 Unilateral primary osteoarthritis, left hip: Secondary | ICD-10-CM

## 2020-01-09 NOTE — Progress Notes (Signed)
Subjective: Patient is here for ultrasound-guided intra-articular left hip injection.   Groin pain when standing.  Objective:  Moderate pain with IR.  Procedure: Ultrasound-guided left hip injection: After sterile prep with Betadine, injected 8 cc 1% lidocaine without epinephrine and 40 mg methylprednisolone using a 22-gauge spinal needle, passing the needle through the iliofemoral ligament into the femoral head/neck junction.  Injectate seen filling joint capsule.  Modest immediate relief.

## 2020-01-21 ENCOUNTER — Telehealth: Payer: Self-pay | Admitting: Neurology

## 2020-01-21 ENCOUNTER — Other Ambulatory Visit: Payer: Self-pay

## 2020-01-21 DIAGNOSIS — J449 Chronic obstructive pulmonary disease, unspecified: Secondary | ICD-10-CM | POA: Diagnosis not present

## 2020-01-21 MED ORDER — CARBAMAZEPINE 200 MG PO TABS: ORAL_TABLET | ORAL | 0 refills | Status: DC

## 2020-01-21 MED FILL — TEGretol 200 MG TABS: 200 | 15 days supply | Qty: 30 | Fill #5

## 2020-01-21 NOTE — Telephone Encounter (Signed)
Pt is needing a refill on her carbamazepine (TEGRETOL) 200 MG tablet sent in to the [facility] on [address]  Pt would like to know if this can be sent in as a 3 month supply so she does not have to go to the pharmacy every month. Please advise.

## 2020-01-21 NOTE — Telephone Encounter (Addendum)
If patient calls back we can send in a refill. Pt was last seen 04/2019 and does not have a follow up appt. We can send in temporary refill until her appt. Pt needs an appt before 04/2020. Pt had appt in 10//2020 but cancel.  Vm was left for patient to call back .

## 2020-01-21 NOTE — Telephone Encounter (Signed)
Pt has been scheduled for 03/26/20 with NP.

## 2020-01-21 NOTE — Telephone Encounter (Signed)
Refills sent for 3 months until pts appt in April 2021.

## 2020-01-21 NOTE — Telephone Encounter (Signed)
revised 

## 2020-01-30 ENCOUNTER — Ambulatory Visit: Payer: Medicaid Other | Admitting: Orthopaedic Surgery

## 2020-02-18 DIAGNOSIS — J449 Chronic obstructive pulmonary disease, unspecified: Secondary | ICD-10-CM | POA: Diagnosis not present

## 2020-02-21 ENCOUNTER — Other Ambulatory Visit: Payer: Self-pay | Admitting: Neurology

## 2020-02-25 ENCOUNTER — Other Ambulatory Visit: Payer: Self-pay

## 2020-02-25 ENCOUNTER — Telehealth: Payer: Self-pay | Admitting: Neurology

## 2020-02-25 MED ORDER — CARBAMAZEPINE 200 MG PO TABS: ORAL_TABLET | ORAL | 0 refills | Status: DC

## 2020-02-25 NOTE — Telephone Encounter (Signed)
1) Medication(s) Requested (by name): carbamazepine (TEGRETOL) 200 MG tablet   2) [facility]acy [facility]: [id number] - [place], [place] - [address]. AT [address] [place]   3) Special Requests: Pt states she was advised by pharmacy that they have not received the medication pt states she  Would like to get her medication today if possible.

## 2020-02-25 NOTE — Telephone Encounter (Signed)
Medication sent to pharmacy  

## 2020-02-26 ENCOUNTER — Other Ambulatory Visit: Payer: Self-pay

## 2020-02-26 MED ORDER — CARBAMAZEPINE 200 MG PO TABS: ORAL_TABLET | ORAL | 0 refills | Status: DC

## 2020-02-26 MED FILL — TEGretol 200 MG TABS: 200 | 30 days supply | Qty: 60 | Fill #0

## 2020-02-26 NOTE — Telephone Encounter (Signed)
Pt called back stating that it was called in to the wrong pharmacy. Pt states that it needs to be [facility] in to the [facility]. Please advise.

## 2020-02-26 NOTE — Telephone Encounter (Signed)
Medication sent to [facility].

## 2020-02-26 NOTE — Telephone Encounter (Signed)
Medication sent to [facility].

## 2020-03-20 DIAGNOSIS — J449 Chronic obstructive pulmonary disease, unspecified: Secondary | ICD-10-CM | POA: Diagnosis not present

## 2020-03-24 ENCOUNTER — Ambulatory Visit: Payer: Medicaid Other

## 2020-03-25 NOTE — Progress Notes (Signed)
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  She could not tolerate gabapentin due to drowsiness and dry mouth, says it felt like a brick on her face.  She was started on carbamazepine, now taking 200 mg twice a day.  She no longer has any pain or spasms to the left side of her face with carbamazepine.  If she misses the medication for a few days, she will have left hemi facial spasm.  Says has been dieting for 1 month, only lost 3 lbs, wonder if related to carbamazepine?  A few weeks ago, had a headache for 2 weeks, but went away. Overall, feeling good, pleased with medication efficacy.  She has COPD. She has no hearing in the left ear. She presents today for follow-up unaccompanied.  HISTORY 04/23/2019 Dr. Jannifer Franklin: Madison Phillips is a 54 year old right-handed black female with a history of an acoustic neuroma that was discovered in 2011.  At that time, the patient lived in [place], she was noting some hearing deficits on the left and went to have this evaluated.  At that time, the acoustic neuroma was discovered, she underwent a gamma knife procedure.  Within 6 months after the gamma knife procedure, she began having episodes of hemifacial spasm on the left and was subsequently treated with gabapentin which seemed to help.  She went about 9 years without any problems with hemifacial spasm but this returned in mid December 2019.  The patient underwent a CT scan of the brain without contrast at that time and this was unremarkable.  The patient has also had episodes of severe shooting pain in the left ear and she has had left-sided headaches that may occur 2 or 3 times a week.  The left-sided headache may last several hours.  She is having hemifacial spasm  initially occurring 3-4 times a day but now is occurring only once or twice a day.  The entire left face is involved, this is unrelated to the sharp ear pain or to the left-sided headache.  She will take Motrin for the headache.  She reports no numbness or weakness of the arms or legs or face.  She does have episodic dizzy spells.  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  She denies issues controlling the bowels of the bladder with exception that there is some stress incontinence of the bladder at times.  She reports no significant balance issues.  There is no issue with neck pain.  She does have a history of COPD.  She did take her sister's gabapentin, she believes that this worsened her headache and did not help the hemifacial spasm   REVIEW OF SYSTEMS: Out of a complete 14 system review of symptoms, the patient complains only of the following symptoms, and all other reviewed systems are negative.  Facial pain   ALLERGIES: No Known Allergies  HOME MEDICATIONS: Outpatient Medications Prior to Visit  Medication Sig Dispense Refill  . Fluticasone-Umeclidin-Vilant (TRELEGY ELLIPTA) 100-62.5-25 MCG/INH AEPB Take 1 puff by mouth daily. 60 each 11  . OXYGEN Inhale into the lungs. 2L rest 3L exertion continuous    . vitamin C (VITAMIN C) 500 MG tablet Take 1 tablet (500 mg total) by mouth daily. Please take for 10 days    .  zinc sulfate 220 (50 Zn) MG capsule Take 1 capsule (220 mg total) by mouth daily. Please take for 10 days    . carbamazepine (TEGRETOL) 200 MG tablet 1 full tablet twice daily 180 tablet 0  . albuterol (PROVENTIL) (2.5 MG/3ML) 0.083% nebulizer solution Take 3 mLs (2.5 mg total) by nebulization every 6 (six) hours as needed for wheezing or shortness of breath. 75 mL 12  . Albuterol Sulfate (PROAIR RESPICLICK) 123XX123 (90 Base) MCG/ACT AEPB Inhale 2 puffs into the lungs every 6 (six) hours as needed. (Patient not taking: Reported on 03/26/2020) 1 each 4  . ipratropium-albuterol (DUONEB)  0.5-2.5 (3) MG/3ML SOLN Take 3 mLs by nebulization 4 (four) times daily for 30 days. 360 mL 2   No facility-administered medications prior to visit.    PAST MEDICAL HISTORY: Past Medical History:  Diagnosis Date  . Acute respiratory failure with hypoxia ([place]) 05/24/2019  . COPD (chronic obstructive pulmonary disease) ([place])   . GERD (gastroesophageal reflux disease) 12/25/2018  . 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 Left ear hearing loss 12/05/2018  . Pneumonia due to COVID-19 virus 05/24/2019    PAST SURGICAL HISTORY: Past Surgical History:  Procedure Laterality Date  . cyber knife    . ectopic pregnanacy      FAMILY HISTORY: Family History  Problem Relation Age of Onset  . Hypothyroidism Mother   . Diabetes Mellitus II Maternal Aunt   . Diabetes Maternal Aunt   . Cancer Father   . Breast cancer Neg Hx     SOCIAL HISTORY: Social History   Socioeconomic History  . Marital status: Single    Spouse name: Not on file  . Number of children: Not on file  . Years of education: Not on file  . Highest education level: Not on file  Occupational History  . Not on file  Tobacco Use  . Smoking status: Former Research scientist (life sciences)  . Smokeless tobacco: Never Used  Substance and Sexual Activity  . Alcohol use: Yes    Alcohol/week: 2.0 - 3.0 standard drinks    Types: 2 - 3 Cans of beer per week    Comment: a night  . Drug use: Not Currently  . Sexual activity: Yes  Other Topics Concern  . Not on file  Social History Narrative  . Not on file   Social Determinants of Health   Financial Resource Strain:   . Difficulty of Paying Living Expenses:   Food Insecurity:   . Worried About Charity fundraiser in the Last Year:   . Arboriculturist in the Last Year:   Transportation Needs:   . Film/video editor (Medical):   Marland Kitchen Lack of Transportation (Non-Medical):   Physical Activity:   . Days of Exercise per Week:   . Minutes of Exercise per Session:   Stress:   . Feeling of Stress :    Social Connections:   . Frequency of Communication with Friends and Family:   . Frequency of Social Gatherings with Friends and Family:   . Attends Religious Services:   . Active Member of Clubs or Organizations:   . Attends Archivist Meetings:   Marland Kitchen Marital Status:   Intimate Partner Violence:   . Fear of Current or Ex-Partner:   . Emotionally Abused:   Marland Kitchen Physically Abused:   . Sexually Abused:    PHYSICAL EXAM  Vitals:   03/26/20 0835  BP: 136/90  Pulse: 93  Temp: 97.9 F (36.6 C)  Weight: 186 lb (84.4 kg)  Height: 5\' 10"  (1.778 m)   Body mass index is 26.69 kg/m.  Generalized: Well developed, in no acute distress   Neurological examination  Mentation: Alert oriented to time, place, history taking. Follows all commands speech and language fluent Cranial nerve II-XII: Pupils were equal round reactive to light. Extraocular movements were full, visual field were full on confrontational test. Facial sensation and strength were normal.  Head turning and shoulder shrug  were normal and symmetric. Motor: The motor testing reveals 5 over 5 strength of all 4 extremities. Good symmetric motor tone is noted throughout.  Sensory: Sensory testing is intact to soft touch on all 4 extremities. No evidence of extinction is noted.  Coordination: Cerebellar testing reveals good finger-nose-finger and heel-to-shin bilaterally.  Gait and station: Gait is normal. Tandem gait is normal. Romberg is negative. No drift is seen.  Reflexes: Deep tendon reflexes are symmetric and normal bilaterally.   DIAGNOSTIC DATA (LABS, IMAGING, TESTING) - I reviewed patient records, labs, notes, testing and imaging myself where available.  Lab Results  Component Value Date   WBC 8.7 05/27/2019   HGB 14.2 05/27/2019   HCT 43.8 05/27/2019   MCV 97.1 05/27/2019   PLT 220 05/27/2019      Component Value Date/Time   NA 142 05/27/2019 0400   NA 141 02/02/2019 0946   K 3.9 05/27/2019 0400   CL  104 05/27/2019 0400   CO2 32 05/27/2019 0400   GLUCOSE 69 (L) 05/27/2019 0400   BUN 22 (H) 05/27/2019 0400   BUN 12 02/02/2019 0946   CREATININE 0.72 05/27/2019 0400   CALCIUM 8.7 (L) 05/27/2019 0400   PROT 6.3 (L) 05/27/2019 0400   ALBUMIN 3.6 05/27/2019 0400   AST 20 05/27/2019 0400   ALT 18 05/27/2019 0400   ALKPHOS 56 05/27/2019 0400   BILITOT 0.3 05/27/2019 0400   GFRNONAA >60 05/27/2019 0400   GFRAA >60 05/27/2019 0400   No results found for: CHOL, HDL, LDLCALC, LDLDIRECT, TRIG, CHOLHDL Lab Results  Component Value Date   HGBA1C 5.4 12/24/2018   No results found for: [id number] Lab Results  Component Value Date   TSH 0.537 12/05/2018      ASSESSMENT AND PLAN 54 y.o. year old female  has a past medical history of Acute respiratory failure with hypoxia ([place]) (05/24/2019), COPD (chronic obstructive pulmonary disease) ([place]), GERD (gastroesophageal reflux disease) (12/25/2018), History of acoustic neuroma (12/05/2018), Left ear hearing loss (12/05/2018), and Pneumonia due to COVID-19 virus (05/24/2019). here with:  1.  History of left acoustic neuroma, status post gamma knife procedure 2.  Left hemifacial spasm 3.  Left-sided headache 4.  Lancinating pain in the left ear  Carbamazepine has been quite beneficial for her symptoms.  She will remain on carbamazepine 200 mg twice a day.  I have not found any evidence of weight gain as significant side effect of the medication.  Since working so well, she will remain on the medication for now. Will check blood work today.  She had MRI in May 2020, supposedly has CD of old MRI, will bring for comparison at next visit. She will follow-up in 6 months with Dr. Jannifer Franklin, she missed her last revisit with him, following initial VV consult.   MRI brain 05/13/19:  IMPRESSION: This MRI of the brain with and without contrast shows the following: 1. 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 2.  There is a normal enhancement pattern elsewhere in the brain. 3. Mild chronic microvascular ischemic changes. There were no acute findings.  I spent 20 minutes of face-to-face and non-face-to-face time with patient.  This included previsit chart review, lab review, study review, order entry, electronic health record documentation, patient education.   Butler Denmark, AGNP-C, DNP 03/26/2020, 9:03 AM [facility] [address] [place], [place] [address] [phone]

## 2020-03-26 ENCOUNTER — Encounter: Payer: Self-pay | Admitting: Neurology

## 2020-03-26 ENCOUNTER — Other Ambulatory Visit: Payer: Self-pay | Admitting: Critical Care Medicine

## 2020-03-26 ENCOUNTER — Ambulatory Visit: Payer: Medicaid Other | Admitting: Neurology

## 2020-03-26 ENCOUNTER — Other Ambulatory Visit: Payer: Self-pay

## 2020-03-26 VITALS — BP 136/90 | HR 93 | Temp 97.9°F | Ht 70.0 in | Wt 186.0 lb

## 2020-03-26 DIAGNOSIS — Z86018 Personal history of other benign neoplasm: Secondary | ICD-10-CM

## 2020-03-26 MED ORDER — CARBAMAZEPINE 200 MG PO TABS: ORAL_TABLET | ORAL | 1 refills | Status: DC

## 2020-03-26 MED FILL — TEGretol 200 MG TABS: 200 | 90 days supply | Qty: 180 | Fill #0

## 2020-03-26 MED FILL — TRELEGY ELLIPTA 100-62.5-25: 100-62.5-25 | 90 days supply | Qty: 180 | Fill #2

## 2020-03-26 MED FILL — IPRAT-ALBUT 0.5-3(2.5) MG/3: 0.5-2.5 (3) | 30 days supply | Qty: 360 | Fill #0

## 2020-03-26 NOTE — Progress Notes (Signed)
I have read the note, and I agree with the clinical assessment and plan.  Madison Phillips Madison Phillips   

## 2020-03-26 NOTE — Patient Instructions (Signed)
It was nice to meet you today Continue with Tegretol as prescribed  Check blood work today  See you in 6 months

## 2020-03-27 ENCOUNTER — Telehealth: Payer: Self-pay

## 2020-03-27 LAB — COMPREHENSIVE METABOLIC PANEL
ALT: 20 IU/L (ref 0–32)
AST: 21 IU/L (ref 0–40)
Albumin/Globulin Ratio: 1.8 (ref 1.2–2.2)
Albumin: 4.4 g/dL (ref 3.8–4.9)
Alkaline Phosphatase: 98 IU/L (ref 39–117)
BUN/Creatinine Ratio: 17 (ref 9–23)
BUN: 15 mg/dL (ref 6–24)
Bilirubin Total: 0.2 mg/dL (ref 0.0–1.2)
CO2: 24 mmol/L (ref 20–29)
Calcium: 9.3 mg/dL (ref 8.7–10.2)
Chloride: 107 mmol/L — ABNORMAL HIGH (ref 96–106)
Creatinine, Ser: 0.89 mg/dL (ref 0.57–1.00)
GFR calc Af Amer: 84 mL/min/{1.73_m2} (ref >59)
GFR calc non Af Amer: 73 mL/min/{1.73_m2} (ref >59)
Globulin, Total: 2.4 g/dL (ref 1.5–4.5)
Glucose: 90 mg/dL (ref 65–99)
Potassium: 4.6 mmol/L (ref 3.5–5.2)
Sodium: 143 mmol/L (ref 134–144)
Total Protein: 6.8 g/dL (ref 6.0–8.5)

## 2020-03-27 LAB — CBC WITH DIFFERENTIAL/PLATELET
Basophils Absolute: 0 10*3/uL (ref 0.0–0.2)
Basos: 0 %
EOS (ABSOLUTE): 0 10*3/uL (ref 0.0–0.4)
Eos: 0 %
Hematocrit: 44 % (ref 34.0–46.6)
Hemoglobin: 14.9 g/dL (ref 11.1–15.9)
Immature Grans (Abs): 0 10*3/uL (ref 0.0–0.1)
Immature Granulocytes: 0 %
Lymphocytes Absolute: 1.3 10*3/uL (ref 0.7–3.1)
Lymphs: 19 %
MCH: 32.5 pg (ref 26.6–33.0)
MCHC: 33.9 g/dL (ref 31.5–35.7)
MCV: 96 fL (ref 79–97)
Monocytes Absolute: 0.5 10*3/uL (ref 0.1–0.9)
Monocytes: 7 %
Neutrophils Absolute: 4.9 10*3/uL (ref 1.4–7.0)
Neutrophils: 74 %
Platelets: 197 10*3/uL (ref 150–450)
RBC: 4.58 x10E6/uL (ref 3.77–5.28)
RDW: 13.4 % (ref 11.7–15.4)
WBC: 6.8 10*3/uL (ref 3.4–10.8)

## 2020-03-27 LAB — CARBAMAZEPINE LEVEL, TOTAL: Carbamazepine (Tegretol), S: 4.5 ug/mL (ref 4.0–12.0)

## 2020-03-27 NOTE — Telephone Encounter (Signed)
I called pt, advised her of lab results and recommendations. Pt reports that when she got home yesterday she "googled" tegretol and found that it can cause weight gain. She will give the tegretol a try and see what happens. Pt verbalized understanding of results.

## 2020-03-27 NOTE — Telephone Encounter (Signed)
We can follow this overtime, she has really benefited from Tegretol for her discomfort, of course can consider other options if she truly feels is causing weight gain.

## 2020-03-27 NOTE — Telephone Encounter (Signed)
-----   Message from Suzzanne Cloud, NP sent at 03/27/2020  8:35 AM EDT ----- Labs look good, continue with current plan.

## 2020-04-16 ENCOUNTER — Ambulatory Visit: Payer: Medicaid Other | Admitting: Family Medicine

## 2020-04-19 DIAGNOSIS — J449 Chronic obstructive pulmonary disease, unspecified: Secondary | ICD-10-CM | POA: Diagnosis not present

## 2020-05-06 ENCOUNTER — Telehealth: Payer: Self-pay | Admitting: Family Medicine

## 2020-05-06 NOTE — Telephone Encounter (Signed)
I called the patient. Scheduled her an appointment on 05/12/20 at 10:00 per her request.

## 2020-05-06 NOTE — Telephone Encounter (Signed)
Patient called.   She is wanting another hip injection but doesn't know if she is eligible yet. Requesting a call back to confirm and schedule if possible.   Call back: [phone]

## 2020-05-06 NOTE — Telephone Encounter (Signed)
The patient last had an intra-articular hip injection 01/09/20. Please advise.

## 2020-05-06 NOTE — Telephone Encounter (Signed)
Yes, that's fine 

## 2020-05-12 ENCOUNTER — Other Ambulatory Visit: Payer: Self-pay

## 2020-05-12 ENCOUNTER — Encounter: Payer: Self-pay | Admitting: Family Medicine

## 2020-05-12 ENCOUNTER — Ambulatory Visit: Payer: Self-pay

## 2020-05-12 ENCOUNTER — Ambulatory Visit (INDEPENDENT_AMBULATORY_CARE_PROVIDER_SITE_OTHER): Payer: Medicaid Other | Admitting: Family Medicine

## 2020-05-12 DIAGNOSIS — M1612 Unilateral primary osteoarthritis, left hip: Secondary | ICD-10-CM

## 2020-05-12 NOTE — Progress Notes (Signed)
   Office Visit Note   Patient: Madison Phillips           Date of Birth: 02/27/1966           MRN: [id number] Visit Date: 05/12/2020 Requested by: [name], MD [place],  [place] [address] PCP: [name], MD  Subjective: Chief Complaint  Patient presents with  . Left Hip - Pain    Intra-articular cortisone injection    HPI: He is here with recurrent left hip pain.  Injection in January helped until about 3 weeks ago.  She would like another one.  She is still contemplating hip replacement at some point.              ROS:   All other systems were reviewed and are negative.  Objective: Vital Signs: There were no vitals taken for this visit.  Physical Exam:  General:  Alert and oriented, in no acute distress. Pulm:  Breathing unlabored. Psy:  Normal mood, congruent affect.  Left hip: She does have pain with internal rotation.  Imaging: US Guided Needle Placement - No Linked Charges  Result Date: 05/12/2020 Ultrasound-guided left hip injection: After sterile prep with Betadine, injected 8 cc 1% lidocaine without epinephrine and 40 mg methylprednisolone using a 22-gauge spinal needle, passing the needle through the iliofemoral ligament into the femoral head/neck junction.  Injectate was seen filling the joint capsule.    Assessment & Plan: 1.  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  Follow-up as needed.     Procedures: No procedures performed  No notes on file     PMFS History: Patient Active Problem List   Diagnosis Date Noted  . Centrilobular emphysema ([place]) 07/17/2019  . Chronic respiratory failure with hypoxia ([place]) 01/23/2019  . Hypertension 12/25/2018  . GERD (gastroesophageal reflux disease) 12/25/2018  . Left ear hearing loss 12/05/2018  . 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 Varicose veins of left lower extremity 12/05/2018  . 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 Acute respiratory failure with hypoxia ([place]) 05/24/2019  . COPD (chronic obstructive pulmonary disease) ([place])   . GERD (gastroesophageal reflux disease) 12/25/2018  . 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 Left ear hearing loss 12/05/2018  . Pneumonia due to COVID-19 virus 05/24/2019    Family History  Problem Relation Age of Onset  . Hypothyroidism Mother   . Diabetes Mellitus II Maternal Aunt   . Diabetes Maternal Aunt   . Cancer Father   . Breast cancer Neg Hx     Past Surgical History:  Procedure Laterality Date  . cyber knife    . ectopic pregnanacy     Social History   Occupational History  . Not on file  Tobacco Use  . Smoking status: Former Research scientist (life sciences)  . Smokeless tobacco: Never Used  Substance and Sexual Activity  . Alcohol use: Yes    Alcohol/week: 2.0 - 3.0 standard drinks    Types: 2 - 3 Cans of beer per week    Comment: a night  . Drug use: Not Currently  . Sexual activity: Yes

## 2020-05-20 DIAGNOSIS — J449 Chronic obstructive pulmonary disease, unspecified: Secondary | ICD-10-CM | POA: Diagnosis not present

## 2020-06-19 DIAGNOSIS — J449 Chronic obstructive pulmonary disease, unspecified: Secondary | ICD-10-CM | POA: Diagnosis not present

## 2020-07-02 ENCOUNTER — Ambulatory Visit: Payer: Medicaid Other | Admitting: Critical Care Medicine

## 2020-07-07 ENCOUNTER — Ambulatory Visit: Payer: Medicaid Other | Attending: Critical Care Medicine | Admitting: Critical Care Medicine

## 2020-07-07 ENCOUNTER — Other Ambulatory Visit: Payer: Self-pay | Admitting: Pharmacist

## 2020-07-07 ENCOUNTER — Other Ambulatory Visit: Payer: Self-pay

## 2020-07-07 ENCOUNTER — Encounter: Payer: Self-pay | Admitting: Critical Care Medicine

## 2020-07-07 ENCOUNTER — Encounter: Payer: Self-pay | Admitting: Gastroenterology

## 2020-07-07 VITALS — BP 130/65 | HR 96 | Temp 97.2°F | Resp 17 | Wt 188.0 lb

## 2020-07-07 DIAGNOSIS — D126 Benign neoplasm of colon, unspecified: Secondary | ICD-10-CM

## 2020-07-07 DIAGNOSIS — J9611 Chronic respiratory failure with hypoxia: Secondary | ICD-10-CM | POA: Diagnosis not present

## 2020-07-07 DIAGNOSIS — Z1231 Encounter for screening mammogram for malignant neoplasm of breast: Secondary | ICD-10-CM | POA: Diagnosis not present

## 2020-07-07 DIAGNOSIS — Z1159 Encounter for screening for other viral diseases: Secondary | ICD-10-CM

## 2020-07-07 DIAGNOSIS — I10 Essential (primary) hypertension: Secondary | ICD-10-CM | POA: Diagnosis not present

## 2020-07-07 DIAGNOSIS — Z1211 Encounter for screening for malignant neoplasm of colon: Secondary | ICD-10-CM

## 2020-07-07 DIAGNOSIS — K219 Gastro-esophageal reflux disease without esophagitis: Secondary | ICD-10-CM

## 2020-07-07 DIAGNOSIS — J432 Centrilobular emphysema: Secondary | ICD-10-CM

## 2020-07-07 DIAGNOSIS — J449 Chronic obstructive pulmonary disease, unspecified: Secondary | ICD-10-CM

## 2020-07-07 MED ORDER — TRELEGY ELLIPTA 100-62.5-25 MCG/INH IN AEPB: 1.0000 | INHALATION_SPRAY | Freq: Every day | RESPIRATORY_TRACT | 11 refills | Status: DC

## 2020-07-07 MED ORDER — ALBUTEROL SULFATE HFA 108 (90 BASE) MCG/ACT IN AERS: 2.0000 | INHALATION_SPRAY | Freq: Four times a day (QID) | RESPIRATORY_TRACT | 2 refills | Status: DC | PRN

## 2020-07-07 MED ORDER — IPRATROPIUM-ALBUTEROL 0.5-2.5 (3) MG/3ML IN SOLN: 3.0000 mL | Freq: Four times a day (QID) | RESPIRATORY_TRACT | 2 refills | Status: DC

## 2020-07-07 MED ORDER — PROAIR RESPICLICK 108 (90 BASE) MCG/ACT IN AEPB: 2.0000 | INHALATION_SPRAY | Freq: Four times a day (QID) | RESPIRATORY_TRACT | 4 refills | Status: DC | PRN

## 2020-07-07 MED ORDER — ALBUTEROL SULFATE (2.5 MG/3ML) 0.083% IN NEBU: 2.5000 mg | INHALATION_SOLUTION | Freq: Four times a day (QID) | RESPIRATORY_TRACT | 12 refills | Status: DC | PRN

## 2020-07-07 MED FILL — TEGretol 200 MG TABS: 200 | 90 days supply | Qty: 180 | Fill #1

## 2020-07-07 MED FILL — IPRAT-ALBUT 0.5-3(2.5) MG/3: 0.5-2.5 (3) | 30 days supply | Qty: 360 | Fill #0

## 2020-07-07 MED FILL — TRELEGY ELLIPTA 100-62.5-25: 100-62.5-25 | 90 days supply | Qty: 180 | Fill #3

## 2020-07-07 MED FILL — PROAIR HFA 90 MCG INHALER: 108 (90 BAS | 25 days supply | Qty: 9 | Fill #0

## 2020-07-07 NOTE — Assessment & Plan Note (Signed)
Gold stage D COPD stable at this time we will repeat pulmonary functions in 6 months

## 2020-07-07 NOTE — Assessment & Plan Note (Signed)
Centrilobular emphysema stable at this time we will continue inhaled medications

## 2020-07-07 NOTE — Assessment & Plan Note (Signed)
Patient needs repeat colonoscopy this was ordered as a referral

## 2020-07-07 NOTE — Assessment & Plan Note (Signed)
Hypertension under adequate control at this time and not on medications

## 2020-07-07 NOTE — Progress Notes (Addendum)
Subjective:    Patient ID: Madison Phillips, female    DOB: 02-18-1966, 54 y.o.   MRN: [id number]  This is a 54 year old female with gold stage D COPD with chronic hypoxic respiratory failure and centrilobular emphysema.  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 Since the last visit in December which was a video visit the patient has improved on Trelegy.  The patient is maintaining oxygen 2 L at rest exertion oxygen off a when she sat.  Patient denies lower extremity edema or headaches.  Patient is no longer smoking at this time is up-to-date on all her vaccines  The patient has been compliant with her oxygen in today's visit in part is to recertify her for her oxygen therapy  07/07/2020 Patient is seen again return follow-up and notes improvement in breathing.  Activity level is increasing.  She would like to go back to work at this time..  Note the patient is declining a Covid vaccine.  She is interested in obtaining a mammogram.    Shortness of Breath This is a chronic problem. The current episode started more than 1 year ago. The problem occurs daily. The problem has been rapidly improving. Associated symptoms include wheezing. Pertinent negatives include no abdominal pain, chest pain, claudication, coryza, ear pain, fever, headaches, hemoptysis, leg pain, leg swelling, neck pain, orthopnea, PND, rash, rhinorrhea, sore throat, sputum production, swollen glands, syncope or vomiting. The symptoms are aggravated by emotional upset, exercise, any activity, odors, fumes and lying flat. Risk factors include smoking. She has tried beta agonist inhalers, steroid inhalers and ipratropium inhalers for the symptoms. Her past medical history is significant for COPD.   Past Medical History:  Diagnosis Date  . Acute respiratory failure with hypoxia ([place]) 05/24/2019  . COPD (chronic obstructive pulmonary disease) ([place])   . GERD (gastroesophageal reflux  disease) 12/25/2018  . 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 Left ear hearing loss 12/05/2018  . Pneumonia due to COVID-19 virus 05/24/2019     Family History  Problem Relation Age of Onset  . Hypothyroidism Mother   . Diabetes Mellitus II Maternal Aunt   . Diabetes Maternal Aunt   . Cancer Father   . Breast cancer Neg Hx      Social History   Socioeconomic History  . Marital status: Single    Spouse name: Not on file  . Number of children: Not on file  . Years of education: Not on file  . Highest education level: Not on file  Occupational History  . Not on file  Tobacco Use  . Smoking status: Former Research scientist (life sciences)  . Smokeless tobacco: Never Used  Vaping Use  . Vaping Use: Never used  Substance and Sexual Activity  . Alcohol use: Yes    Alcohol/week: 2.0 - 3.0 standard drinks    Types: 2 - 3 Cans of beer per week    Comment: a night  . Drug use: Not Currently  . Sexual activity: Yes  Other Topics Concern  . Not on file  Social History Narrative  . Not on file   Social Determinants of Health   Financial Resource Strain:   . Difficulty of Paying Living Expenses: Not on file  Food Insecurity:   . Worried About Charity fundraiser in the Last Year: Not on file  . Ran Out of Food in the Last Year: Not on file  Transportation Needs:   . Lack of Transportation (Medical):  Not on file  . Lack of Transportation (Non-Medical): Not on file  Physical Activity:   . Days of Exercise per Week: Not on file  . Minutes of Exercise per Session: Not on file  Stress:   . Feeling of Stress : Not on file  Social Connections:   . Frequency of Communication with Friends and Family: Not on file  . Frequency of Social Gatherings with Friends and Family: Not on file  . Attends Religious Services: Not on file  . Active Member of Clubs or Organizations: Not on file  . Attends Archivist Meetings: Not on file  . Marital Status: Not on file  Intimate Partner Violence:   . Fear of  Current or Ex-Partner: Not on file  . Emotionally Abused: Not on file  . Physically Abused: Not on file  . Sexually Abused: Not on file     No Known Allergies   Outpatient Medications Prior to Visit  Medication Sig Dispense Refill  . carbamazepine (TEGRETOL) 200 MG tablet 1 full tablet twice daily 180 tablet 1  . OXYGEN Inhale into the lungs. 2L rest 3L exertion continuous    . vitamin C (VITAMIN C) 500 MG tablet Take 1 tablet (500 mg total) by mouth daily. Please take for 10 days    . zinc sulfate 220 (50 Zn) MG capsule Take 1 capsule (220 mg total) by mouth daily. Please take for 10 days    . Albuterol Sulfate (PROAIR RESPICLICK) 409 (90 Base) MCG/ACT AEPB Inhale 2 puffs into the lungs every 6 (six) hours as needed. 1 each 4  . Fluticasone-Umeclidin-Vilant (TRELEGY ELLIPTA) 100-62.5-25 MCG/INH AEPB Take 1 puff by mouth daily. 60 each 11  . albuterol (PROVENTIL) (2.5 MG/3ML) 0.083% nebulizer solution Take 3 mLs (2.5 mg total) by nebulization every 6 (six) hours as needed for wheezing or shortness of breath. 75 mL 12  . ipratropium-albuterol (DUONEB) 0.5-2.5 (3) MG/3ML SOLN TAKE 3 MLS BY NEBULIZATION 4 (FOUR) TIMES DAILY FOR 30 DAYS. 360 mL 2   No facility-administered medications prior to visit.      Review of Systems  Constitutional: Negative for fever.  HENT: Negative for ear pain, rhinorrhea and sore throat.   Respiratory: Positive for shortness of breath and wheezing. Negative for hemoptysis and sputum production.   Cardiovascular: Negative for chest pain, orthopnea, claudication, leg swelling, syncope and PND.  Gastrointestinal: Negative for abdominal pain and vomiting.  Musculoskeletal: Negative for neck pain.  Skin: Negative for rash.  Neurological: Negative for headaches.       Objective:   Physical Exam Vitals:   07/07/20 0904  BP: 130/65  Pulse: 96  Resp: 17  Temp: (!) 97.2 F (36.2 C)  TempSrc: Temporal  SpO2: 94%  Weight: 188 lb (85.3 kg)    Gen:  Pleasant, well-nourished, in no distress,  normal affect  ENT: No lesions,  mouth clear,  oropharynx clear, no postnasal drip  Neck: No JVD, no TMG, no carotid bruits  Lungs: No use of accessory muscles, no dullness to percussion, distant breath sounds with prolonged expiratory phase  Cardiovascular: RRR, heart sounds normal, no murmur or gallops, no peripheral edema  Abdomen: soft and NT, no HSM,  BS normal  Musculoskeletal: No deformities, no cyanosis or clubbing  Neuro: alert, non focal  Skin: Warm, no lesions or rashes  No results found.       Assessment & Plan:  I personally reviewed all images and lab data in the [facility] system as well as  any outside material available during this office visit and agree with the  radiology impressions.   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) Gold stage D COPD stable at this time we will repeat pulmonary functions in 6 months  GERD (gastroesophageal reflux disease) Reflux disease stable at this time  Polyp of colon Patient needs repeat colonoscopy this was ordered as a referral   Diagnoses and all orders for this visit:  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 ([place])  Centrilobular emphysema ([place])  Breast cancer screening by mammogram -     MM Digital Screening; Future  Colon cancer screening -     Ambulatory referral to Gastroenterology  Need for hepatitis C screening test -     Hepatitis C Antibody  Essential hypertension  Chronic respiratory failure with hypoxia ([facility])  Gastroesophageal reflux disease without esophagitis  Adenomatous polyp of colon, unspecified part of  colon  Other orders -     albuterol (PROVENTIL) (2.5 MG/3ML) 0.083% nebulizer solution; Take 3 mLs (2.5 mg total) by nebulization every 6 (six) hours as needed for wheezing or shortness of breath. -     Discontinue: Albuterol Sulfate (PROAIR RESPICLICK) 322 (90 Base) MCG/ACT AEPB; Inhale 2 puffs into the lungs every 6 (six) hours as needed. -     Fluticasone-Umeclidin-Vilant (TRELEGY ELLIPTA) 100-62.5-25 MCG/INH AEPB; Take 1 puff by mouth daily. -     ipratropium-albuterol (DUONEB) 0.5-2.5 (3) MG/3ML SOLN; Take 3 mLs by nebulization 4 (four) times daily.  Hepatitis C assay will also be obtained I spent 5 minutes going over the need for the Covid vaccine the patient wishes to hold this time

## 2020-07-07 NOTE — Patient Instructions (Signed)
No change in medications  A colonoscopy was ordered  A mammogram was ordered  Lab today Hep C assay  We discussed the covid vaccine and you have declined to receive for now  If you change your mind, see below  COVID-19 Vaccine Information can be found at: ShippingScam.co.uk For questions related to vaccine distribution or appointments, please email vaccine@[place] .com or call [phone].

## 2020-07-07 NOTE — Assessment & Plan Note (Signed)
Chronic respiratory failure with hypoxemia continue oxygen therapy

## 2020-07-07 NOTE — Assessment & Plan Note (Signed)
Reflux disease stable at this time

## 2020-07-20 DIAGNOSIS — J449 Chronic obstructive pulmonary disease, unspecified: Secondary | ICD-10-CM | POA: Diagnosis not present

## 2020-07-28 ENCOUNTER — Other Ambulatory Visit: Payer: Medicaid Other

## 2020-08-19 ENCOUNTER — Ambulatory Visit: Payer: Medicaid Other | Admitting: Gastroenterology

## 2020-08-19 ENCOUNTER — Encounter: Payer: Self-pay | Admitting: Gastroenterology

## 2020-08-19 VITALS — BP 138/84 | HR 88 | Ht 70.0 in | Wt 186.0 lb

## 2020-08-19 DIAGNOSIS — Z1211 Encounter for screening for malignant neoplasm of colon: Secondary | ICD-10-CM

## 2020-08-19 DIAGNOSIS — J449 Chronic obstructive pulmonary disease, unspecified: Secondary | ICD-10-CM | POA: Diagnosis not present

## 2020-08-19 NOTE — Progress Notes (Signed)
HPI :  54 y/o female with oxygen dependant COPD, history of acoustic neuroma, GERD, referred by [name] MD to discuss colon cancer screening.   She had a colonoscopy she thinks at age 22. No prior records available. She thinks it was normal but states she also thought she was told to follow up in 5 years for a repeat exam, although now she is not sure if 5 years or 10 years when questioned about it. She is not aware of any polyps that were removed. She has no FH of colon cancer. No blood in the stools. No new bowel habits changes, no abdominal pains. She does use supplemental oxygen occasionally at night when sleeping or daytime when outside in the heat. She denies any recent cardiopulmonary symptoms that bother her. She is a former tobacco user. No problems with anesthesia in the past.   Echo 01/04/19 - EF 60-65%   Past Medical History:  Diagnosis Date  . Acute respiratory failure with hypoxia ([place]) 05/24/2019  . COPD (chronic obstructive pulmonary disease) ([place])   . GERD (gastroesophageal reflux disease) 12/25/2018  . 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 Left ear hearing loss 12/05/2018  . Pneumonia due to COVID-19 virus 05/24/2019     Past Surgical History:  Procedure Laterality Date  . cyber knife    . ectopic pregnanacy     Family History  Problem Relation Age of Onset  . Hypothyroidism Mother   . Diabetes Mellitus II Maternal Aunt   . Diabetes Maternal Aunt   . Cancer Father   . Breast cancer Neg Hx    Social History   Tobacco Use  . Smoking status: Former Research scientist (life sciences)  . Smokeless tobacco: Never Used  Vaping Use  . Vaping Use: Never used  Substance Use Topics  . Alcohol use: Yes    Alcohol/week: 2.0 - 3.0 standard drinks    Types: 2 - 3 Cans of beer per week    Comment: a night  . Drug use: Not Currently   Current Outpatient Medications  Medication Sig Dispense Refill  . albuterol (PROAIR HFA) 108 (90 Base) MCG/ACT inhaler Inhale 2 puffs into the lungs every 6  (six) hours as needed for wheezing or shortness of breath. 8.5 g 2  . albuterol (PROVENTIL) (2.5 MG/3ML) 0.083% nebulizer solution Take 3 mLs (2.5 mg total) by nebulization every 6 (six) hours as needed for wheezing or shortness of breath. 75 mL 12  . carbamazepine (TEGRETOL) 200 MG tablet 1 full tablet twice daily 180 tablet 1  . Fluticasone-Umeclidin-Vilant (TRELEGY ELLIPTA) 100-62.5-25 MCG/INH AEPB Take 1 puff by mouth daily. 60 each 11  . OXYGEN Inhale into the lungs. 2L rest 3L exertion continuous    . vitamin C (VITAMIN C) 500 MG tablet Take 1 tablet (500 mg total) by mouth daily. Please take for 10 days    . zinc sulfate 220 (50 Zn) MG capsule Take 1 capsule (220 mg total) by mouth daily. Please take for 10 days    . ipratropium-albuterol (DUONEB) 0.5-2.5 (3) MG/3ML SOLN Take 3 mLs by nebulization 4 (four) times daily. 360 mL 2   No current facility-administered medications for this visit.   No Known Allergies   Review of Systems: All systems reviewed and negative except where noted in HPI.    Lab Results  Component Value Date   WBC 6.8 03/26/2020   HGB 14.9 03/26/2020   HCT 44.0 03/26/2020   MCV 96 03/26/2020   PLT 197 03/26/2020  Lab Results  Component Value Date   CREATININE 0.89 03/26/2020   BUN 15 03/26/2020   NA 143 03/26/2020   K 4.6 03/26/2020   CL 107 (H) 03/26/2020   CO2 24 03/26/2020    Lab Results  Component Value Date   ALT 20 03/26/2020   AST 21 03/26/2020   ALKPHOS 98 03/26/2020   BILITOT 0.2 03/26/2020     Physical Exam: BP 138/84   Pulse 88   Ht 5\' 10"  (1.778 m)   Wt 186 lb (84.4 kg)   BMI 26.69 kg/m  Constitutional: Pleasant,well-developed, female in no acute distress. HEENT: Normocephalic and atraumatic. Conjunctivae are normal. No scleral icterus. Neck supple.  Cardiovascular: Normal rate, regular rhythm.  Pulmonary/chest: Effort normal and breath sounds normal.  Abdominal: Soft, nondistended, nontender. there are no masses  palpable.  Extremities: no edema Lymphadenopathy: No cervical adenopathy noted. Neurological: Alert and oriented to person place and time. Skin: Skin is warm and dry. No rashes noted. Psychiatric: Normal mood and affect. Behavior is normal.   ASSESSMENT AND PLAN: 54 y/o female referred her regarding the following:  Colon cancer screening / COPD / oxygen dependence - patient is average risk for CRC, last exam 5 years ago, we don't have the reports on file and she is not sure if she was told to follow up in 5 or 10 years. We discussed colon cancer screening and ways to do that. Optical colonoscopy preferred if possible and she can tolerate it. Given her COPD and oxygen dependence, if she were to have a colonoscopy this would need to be done at the hospital for anesthesia support given her oxygen use, as she is higher than average risk for anesthesia. She understands and agrees to this, the question is when is she next due for a colonoscopy as she has no concerning symptoms at this time. We were able to get the name of the facility where she had her last exam and will reach out to them to get results. Once I have that we will make further recommendations. If she does not hear from me in 2 weeks I asked her to contact me to check on the status of her records. She agreed, all questions answered.   Cellar, MD [facility]  CC: [name], MD

## 2020-08-19 NOTE — Patient Instructions (Addendum)
If you are age 56 or older, your body mass index should be between 23-30. Your Body mass index is 26.69 kg/m. If this is out of the aforementioned range listed, please consider follow up with your Primary Care Provider.  If you are age 36 or younger, your body mass index should be between 19-25. Your Body mass index is 26.69 kg/m. If this is out of the aformentioned range listed, please consider follow up with your Primary Care Provider.    We will request your records from [facility] from your last colonoscopy to determine when you will be due next.   Thank you for entrusting me with your care and for choosing [facility], Dr. Beadle Cellar

## 2020-08-31 IMAGING — DX DG CHEST 1V PORT
1 series · 2 of 2 positions shown · non-contrast
Comparison: None.

CLINICAL DATA: Shortness of breath

EXAM:
PORTABLE CHEST 1 VIEW

[Series 1: chest ap · 0.14mm/px · 2 of 2 slices shown]
[im 1/2]
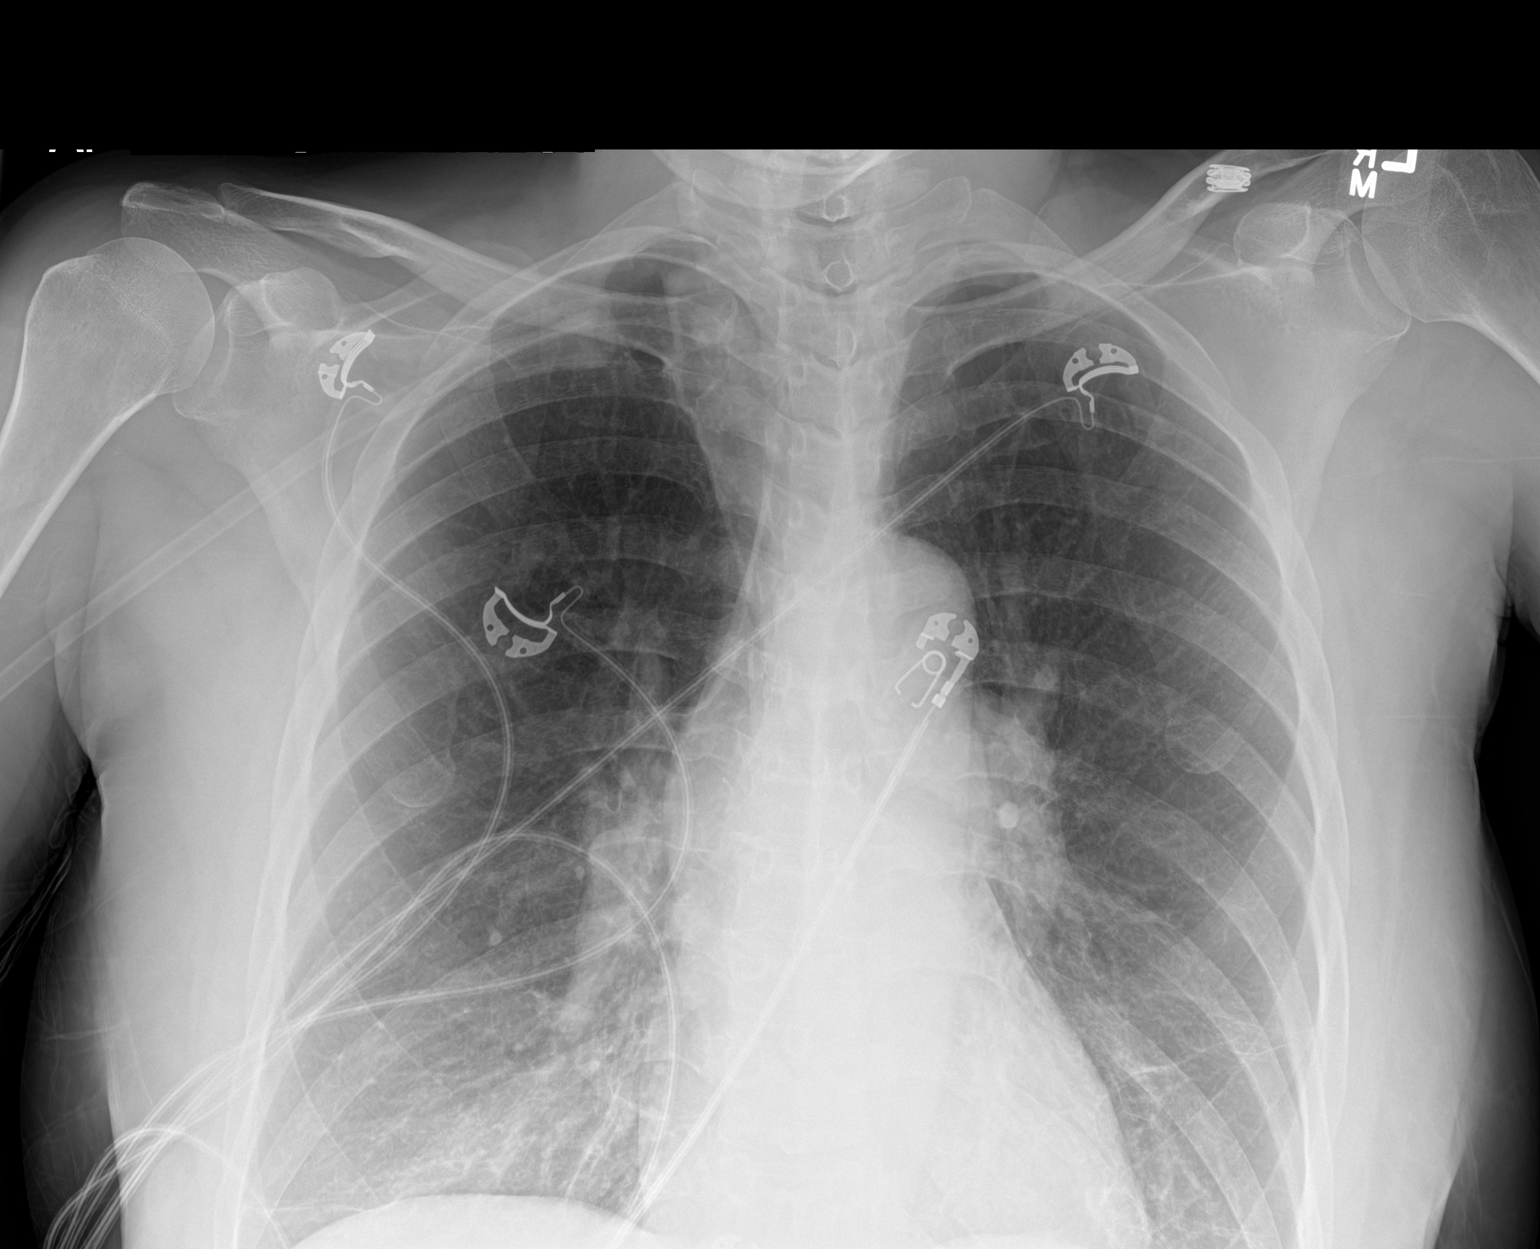
[im 2/2]
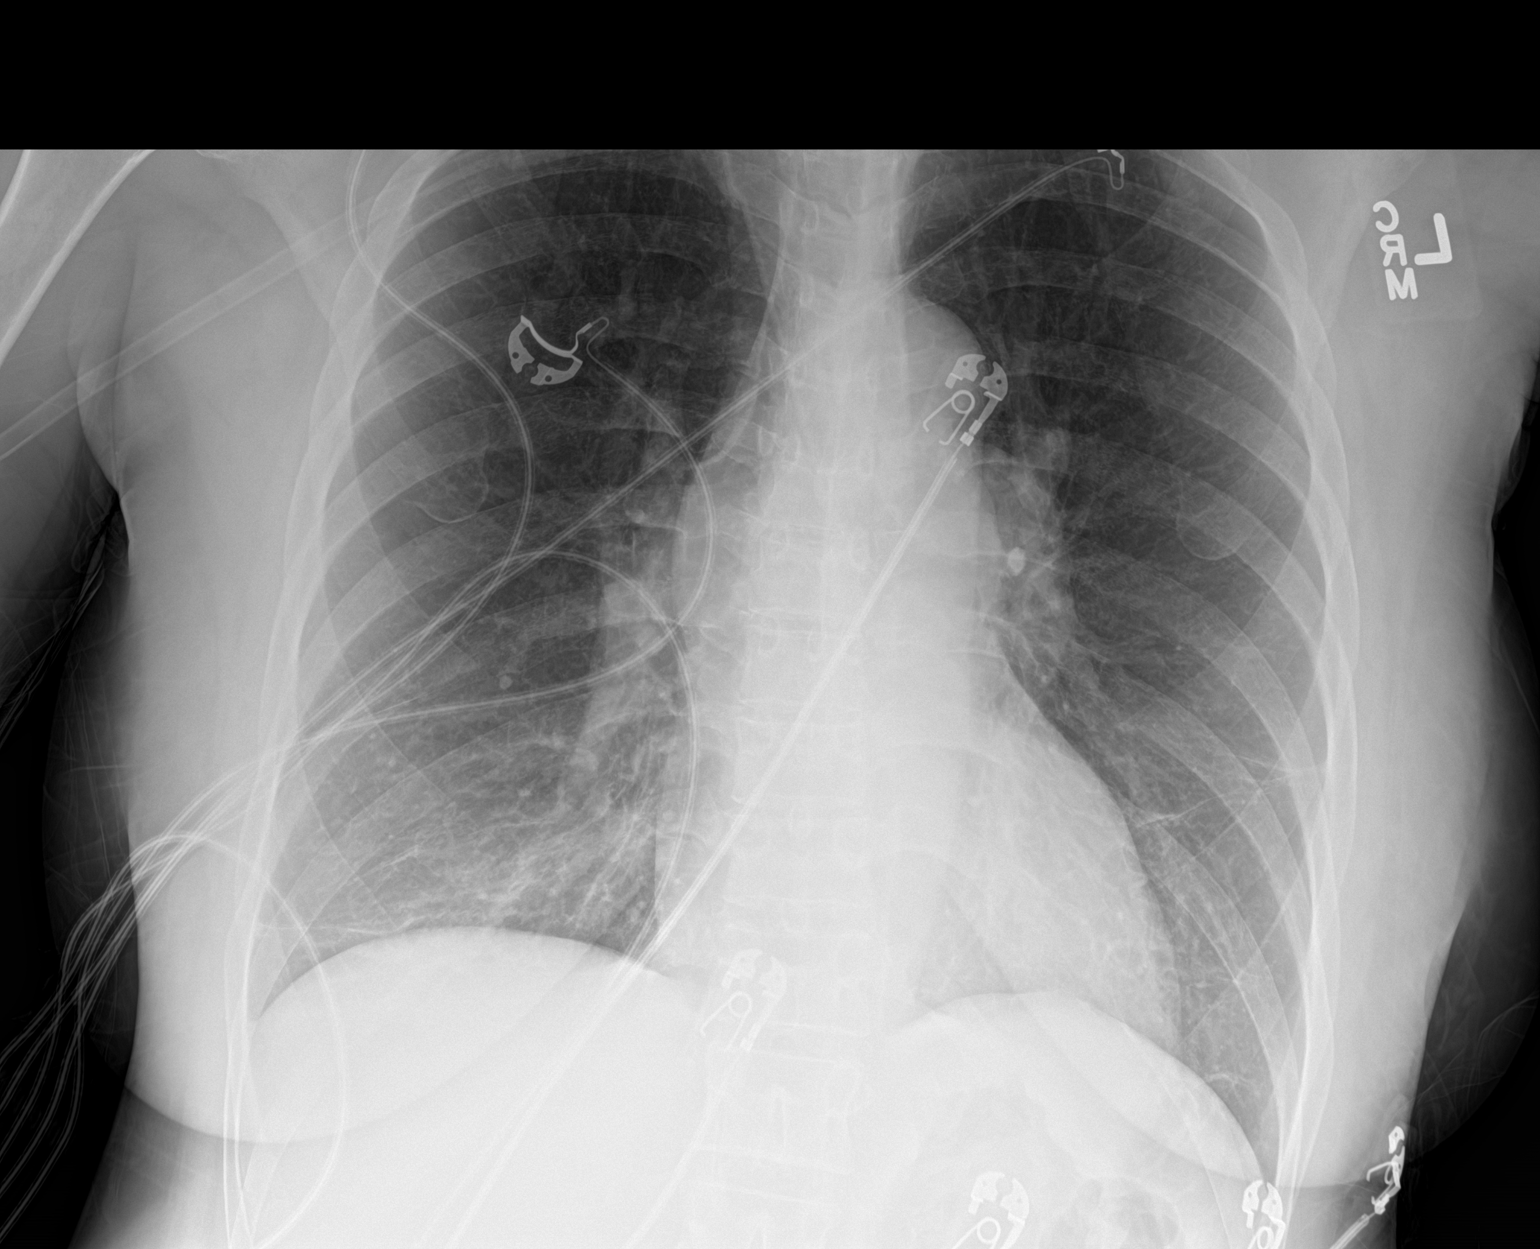

[2 of 2 positions shown; findings below may reference images not displayed]

FINDINGS: Cardiac shadow is within normal limits. The lungs are hyperinflated
bilaterally. No focal infiltrate or sizable effusion are seen. No
bony abnormality is noted.
IMPRESSION: COPD without acute abnormality.

## 2020-09-10 ENCOUNTER — Ambulatory Visit: Payer: Medicaid Other

## 2020-09-15 ENCOUNTER — Ambulatory Visit: Payer: Medicaid Other

## 2020-09-19 DIAGNOSIS — J449 Chronic obstructive pulmonary disease, unspecified: Secondary | ICD-10-CM | POA: Diagnosis not present

## 2020-09-29 ENCOUNTER — Ambulatory Visit
Admission: RE | Admit: 2020-09-29 | Discharge: 2020-09-29 | Disposition: A | Discharge status: Home / Self Care | Payer: Medicaid Other | Source: Ambulatory Visit | Attending: Critical Care Medicine | Admitting: Critical Care Medicine

## 2020-09-29 ENCOUNTER — Other Ambulatory Visit: Payer: Self-pay

## 2020-09-29 DIAGNOSIS — Z1231 Encounter for screening mammogram for malignant neoplasm of breast: Secondary | ICD-10-CM

## 2020-09-30 ENCOUNTER — Telehealth: Payer: Self-pay | Admitting: Critical Care Medicine

## 2020-09-30 DIAGNOSIS — R21 Rash and other nonspecific skin eruption: Secondary | ICD-10-CM

## 2020-09-30 NOTE — Telephone Encounter (Signed)
Copied from [place] [id number]. Topic: Referral - Request for Referral >> Sep 30, 2020  8:40 AM Leward Quan A wrote: Has patient seen PCP for this complaint? Yes.   *If NO, is insurance requiring patient see PCP for this issue before PCP can refer them? Referral for which specialty: Dermatologist  Preferred provider/office: [facility] [phone] Reason for referral: unsure

## 2020-09-30 NOTE — Telephone Encounter (Signed)
Spoke w/ pt and she stated that she needed referral "for her skin" when RN asked further questions she only stated that she has a rash that has been there "for awhile" would not give further details as far as location, size, etc. Pls advise or place referral for dermatology.

## 2020-10-01 NOTE — Telephone Encounter (Signed)
Referral to dermatology made 

## 2020-10-02 NOTE — Progress Notes (Signed)
Normal result letter sent via MyChart and mailed to pt

## 2020-10-02 NOTE — Telephone Encounter (Addendum)
Pt called back because she has requested Dr Riley Kill, [facility]. Pt states she has checked her book and he is in her network. Pt states she told the person when she called at first call who she wanted.  [phone]  Please send to them.

## 2020-10-02 NOTE — Telephone Encounter (Signed)
FYI

## 2020-10-03 ENCOUNTER — Other Ambulatory Visit: Payer: Self-pay | Admitting: Neurology

## 2020-10-03 MED FILL — TRELEGY ELLIPTA 100-62.5-25: 100-62.5-25 | 60 days supply | Qty: 120 | Fill #4

## 2020-10-03 MED FILL — IPRAT-ALBUT 0.5-3(2.5) MG/3: 0.5-2.5 (3) | 30 days supply | Qty: 360 | Fill #1

## 2020-10-03 NOTE — Telephone Encounter (Signed)
Thanks  Sent Referral to [facility] [address] [place], [place] [address]  [phone] (fax) Sent Referral and Notes in [facility] ([facility]) PROGRAM

## 2020-10-08 ENCOUNTER — Other Ambulatory Visit: Payer: Self-pay | Admitting: Critical Care Medicine

## 2020-10-08 NOTE — Telephone Encounter (Signed)
Medication Refill - Medication: Fluticasone-Umeclidin-Vilant (TRELEGY ELLIPTA) 100-62.5-25 MCG/INH AEPB ,carbamazepine (TEGRETOL) 200 MG tablet  (Patient contacted pharmacy and pharmacy state pt needs appt to refill meds. Patient stated she already has an appt scheduled and would like prescriptions called in today. Pt requesting callback once medication have been sent over.)    Has the patient contacted their pharmacy? yes (Agent: If no, request that the patient contact the pharmacy for the refill.) (Agent: If yes, when and what did the pharmacy advise?)Contact PCP  Preferred Pharmacy (with phone number or street name):  [facility], [place] [address] Phone:  [phone]  Fax:  [phone]       Agent: Please be advised that RX refills may take up to 3 business days. We ask that you follow-up with your pharmacy.

## 2020-10-08 NOTE — Telephone Encounter (Signed)
Requested Prescriptions  Refused Prescriptions Disp Refills  . Fluticasone-Umeclidin-Vilant (TRELEGY ELLIPTA) 100-62.5-25 MCG/INH AEPB 60 each 11    Sig: Take 1 puff by mouth daily.     Off-Protocol Failed - 10/08/2020  4:21 PM      Failed - Medication not assigned to a protocol, review manually.      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 Elsie Stain, MD   1 year ago COPD with chronic bronchitis [facility])   [place] Elsie Stain, MD   1 year ago Pneumonia due to COVID-19 virus   [place], MD      Future Appointments            In 3 weeks Elsie Stain, MD [place]           . carbamazepine (TEGRETOL) 200 MG tablet 180 tablet 1    Sig: 1 full tablet twice daily     Not Delegated - Neurology:  Anticonvulsants - carbamazepine Failed - 10/08/2020  4:21 PM      Failed - This refill cannot be delegated      Failed - AST in normal range and within 90 days    AST  Date Value Ref Range Status  03/26/2020 21 0 - 40 IU/L Final         Failed - ALT in normal range and within 90 days    ALT  Date Value Ref Range Status  03/26/2020 20 0 - 32 IU/L Final         Failed - WBC in normal range and within 90 days    WBC  Date Value Ref Range Status  03/26/2020 6.8 3.4 - 10.8 x10E3/uL Final  05/27/2019 8.7 4.0 - 10.5 K/uL Final         Failed - PLT in normal range and within 90 days    Platelets  Date Value Ref Range Status  03/26/2020 197 150 - 450  x10E3/uL Final         Failed - HGB in normal range and within 90 days    Hemoglobin  Date Value Ref Range Status  03/26/2020 14.9 11.1 - 15.9 g/dL Final         Failed - Na in normal range and within 90 days    Sodium  Date Value Ref Range Status  03/26/2020 143 134 - 144 mmol/L Final         Failed - HCT in normal range and within 90 days    Hematocrit  Date Value Ref Range Status  03/26/2020 44.0 34.0 - 46.6 % Final         Passed - Carbamazepine (serum) in normal range and within 360 days    Carbamazepine (Tegretol), S  Date Value Ref Range Status  03/26/2020 4.5 4.0 - 12.0 ug/mL Final    Comment:             In conjunction  with other antiepileptic drugs                                Therapeutic  4.0 -  8.0                                Toxicity     9.0 - 12.0                                    Carbamazepine alone                                Therapeutic  8.0 - 12.0                                 Detection Limit =  2.0                           <2.0 indicated None Detected          Passed - Valid encounter within last 12 months    Recent Outpatient Visits          3 months ago 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

## 2020-10-20 DIAGNOSIS — J449 Chronic obstructive pulmonary disease, unspecified: Secondary | ICD-10-CM | POA: Diagnosis not present

## 2020-11-03 DIAGNOSIS — L810 Postinflammatory hyperpigmentation: Secondary | ICD-10-CM | POA: Diagnosis not present

## 2020-11-04 ENCOUNTER — Encounter: Payer: Self-pay | Admitting: Critical Care Medicine

## 2020-11-04 ENCOUNTER — Other Ambulatory Visit: Payer: Self-pay

## 2020-11-04 ENCOUNTER — Other Ambulatory Visit: Payer: Self-pay | Admitting: Critical Care Medicine

## 2020-11-04 ENCOUNTER — Ambulatory Visit: Payer: Medicaid Other | Attending: Critical Care Medicine | Admitting: Critical Care Medicine

## 2020-11-04 VITALS — BP 157/102 | HR 87 | Wt 182.6 lb

## 2020-11-04 DIAGNOSIS — K219 Gastro-esophageal reflux disease without esophagitis: Secondary | ICD-10-CM | POA: Diagnosis not present

## 2020-11-04 DIAGNOSIS — J432 Centrilobular emphysema: Secondary | ICD-10-CM

## 2020-11-04 DIAGNOSIS — J449 Chronic obstructive pulmonary disease, unspecified: Secondary | ICD-10-CM | POA: Diagnosis not present

## 2020-11-04 DIAGNOSIS — E7841 Elevated Lipoprotein(a): Secondary | ICD-10-CM

## 2020-11-04 DIAGNOSIS — J9611 Chronic respiratory failure with hypoxia: Secondary | ICD-10-CM

## 2020-11-04 DIAGNOSIS — D126 Benign neoplasm of colon, unspecified: Secondary | ICD-10-CM | POA: Diagnosis not present

## 2020-11-04 DIAGNOSIS — I8312 Varicose veins of left lower extremity with inflammation: Secondary | ICD-10-CM

## 2020-11-04 DIAGNOSIS — I10 Essential (primary) hypertension: Secondary | ICD-10-CM

## 2020-11-04 DIAGNOSIS — Z86018 Personal history of other benign neoplasm: Secondary | ICD-10-CM

## 2020-11-04 MED ORDER — TRELEGY ELLIPTA 100-62.5-25 MCG/INH IN AEPB: 1.0000 | INHALATION_SPRAY | Freq: Every day | RESPIRATORY_TRACT | 1 refills | Status: DC

## 2020-11-04 MED ORDER — CARBAMAZEPINE 200 MG PO TABS: ORAL_TABLET | ORAL | 1 refills | Status: DC

## 2020-11-04 MED FILL — TEGretol 200 MG TABS: 200 | 90 days supply | Qty: 180 | Fill #0

## 2020-11-04 NOTE — Assessment & Plan Note (Signed)
Continue oxygen therapy 2 L at bedtime

## 2020-11-04 NOTE — Patient Instructions (Signed)
You declined the flu vaccine and Covid vaccine due to religious reasons  Referral to vascular surgery was made for your varicose vein your left thigh  Monitor your blood pressure at home as you have been doing we will hold off treating this for now  Refills with a 28-month supply of Tegretol was sent to our pharmacy and I will attempt to get a 74-month supply of the Trelegy for each fill  Continue oxygen at bedtime as needed during the day  I sent a message to gastroenterology about the timing of your next colonoscopy they should be back in touch with you on this  Return to see Dr. Joya Gaskins 4 months   Varicose Veins Varicose veins are veins that have become enlarged, bulged, and twisted. They most often appear in the legs. What are the causes? This condition is caused by damage to the valves in the vein. These valves help blood return to your heart. When they are damaged and they stop working properly, blood may flow backward and back up in the veins near the skin, causing the veins to get larger and appear twisted. The condition can result from any issue that causes blood to back up, like pregnancy, prolonged standing, or obesity. What increases the risk? This condition is more likely to develop in people who are:  On their feet a lot.  Pregnant.  Overweight. What are the signs or symptoms? Symptoms of this condition include:  Bulging, twisted, and bluish veins.  A feeling of heaviness. This may be worse at the end of the day.  Leg pain. This may be worse at the end of the day.  Swelling in the leg.  Changes in skin color over the veins. How is this diagnosed? This condition may be diagnosed based on your symptoms, a physical exam, and an ultrasound test. How is this treated? Treatment for this condition may involve:  Avoiding sitting or standing in one position for long periods of time.  Wearing compression stockings. These stockings help to prevent blood clots and reduce  swelling in the legs.  Raising (elevating) the legs when resting.  Losing weight.  Exercising regularly. If you have persistent symptoms or want to improve the way your varicose veins look, you may choose to have a procedure to close the varicose veins off or to remove them. Treatments to close off the veins include:  Sclerotherapy. In this treatment, a solution is injected into a vein to close it off.  Laser treatment. In this treatment, the vein is heated with a laser to close it off.  Radiofrequency vein ablation. In this treatment, an electrical current produced by radio waves is used to close off the vein. Treatments to remove the veins include:  Phlebectomy. In this treatment, the veins are removed through small incisions made over the veins.  Vein ligation and stripping. In this treatment, incisions are made over the veins. The veins are then removed after being tied (ligated) with stitches (sutures). Follow these instructions at home: Activity  Walk as much as possible. Walking increases blood flow. This helps blood return to the heart and takes pressure off your veins. It also increases your cardiovascular strength.  Follow your health care provider's instructions about exercising.  Do not stand or sit in one position for a long period of time.  Do not sit with your legs crossed.  Rest with your legs raised during the day. General instructions   Follow any diet instructions given to you by your health  care provider.  Wear compression stockings as directed by your health care provider. Do not wear other kinds of tight clothing around your legs, pelvis, or waist.  Elevate your legs at night to above the level of your heart.  If you get a cut in the skin over the varicose vein and the vein bleeds: ? Lie down with your leg raised. ? Apply firm pressure to the cut with a clean cloth until the bleeding stops. ? Place a bandage (dressing) on the cut. Contact a health  care provider if:  The skin around your varicose veins starts to break down.  You have pain, redness, tenderness, or hard swelling over a vein.  You are uncomfortable because of pain.  You get a cut in the skin over a varicose vein and it will not stop bleeding. Summary  Varicose veins are veins that have become enlarged, bulged, and twisted. They most often appear in the legs.  This condition is caused by damage to the valves in the vein. These valves help blood return to your heart.  Treatment for this condition includes frequent movements, wearing compression stockings, losing weight, and exercising regularly. In some cases, procedures are done to close off or remove the veins.  Treatment for this condition may include wearing compression stockings, elevating the legs, losing weight, and engaging in regular activity. In some cases, procedures are done to close off or remove the veins. This information is not intended to replace advice given to you by your health care provider. Make sure you discuss any questions you have with your health care provider. Document Revised: 02/01/2019 Document Reviewed: 12/29/2016 Elsevier Patient Education  [place].

## 2020-11-04 NOTE — Assessment & Plan Note (Signed)
Hypertension currently not controlled The patient states her blood pressure is lower at home  I am and ask her to come back in follow-up and bring her blood pressure results with her we will hold off on medications for now

## 2020-11-04 NOTE — Progress Notes (Signed)
Subjective:    Patient ID: Madison Phillips, female    DOB: [birth year], 54 y.o.   MRN: [id number]  12/23/19 This is a 54 year old female with gold stage D COPD with chronic hypoxic respiratory failure and centrilobular emphysema.  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 Since the last visit in December which was a video visit the patient has improved on Trelegy.  The patient is maintaining oxygen 2 L at rest exertion oxygen off a when she sat.  Patient denies lower extremity edema or headaches.  Patient is no longer smoking at this time is up-to-date on all her vaccines  The patient has been compliant with her oxygen in today's visit in part is to recertify her for her oxygen therapy  07/07/2020 Patient is seen again return follow-up and notes improvement in breathing.  Activity level is increasing.  She would like to go back to work at this time..  Note the patient is declining a Covid vaccine.  She is interested in obtaining a mammogram.  11/04/2020 This patient is seen in return follow-up for severe COPD Gold stage D.  Note she is declining Covid and flu vaccines due to religious reasons.  Her largest complaint today is left sided upper thigh pain which is medial in nature.  She has significant varicose veins.  Note the patient also comes in on arrival blood pressure 157/102 and she said she had 2 glasses of wine at lunch.  She states her breathing is at baseline and she is on the Trelegy inhaler and tolerating it well.  She is wearing oxygen 2 L at night only.  Her reflux disease is stable as well. The patient states at home her blood pressure readings are much lower in the 130/80 range. The patient was referred for colonoscopy because of a question of a colon polyp.  The GI surgeon decided to hold off on a colonoscopy and was seeking outside records.  The patient is unaware whether the records have been received as to whether she had a colon  polyp or not when she had her last colonoscopy about 6 years ago.  The patient asked me today to inquire if the records have been received.  I was not able to determine that by looking in the patient's electronic record  Shortness of Breath This is a chronic problem. The current episode started more than 1 year ago. The problem occurs daily. The problem has been rapidly improving. Pertinent negatives include no abdominal pain, chest pain, claudication, coryza, ear pain, fever, headaches, hemoptysis, leg pain, leg swelling, neck pain, orthopnea, PND, rash, rhinorrhea, sore throat, sputum production, swollen glands, syncope, vomiting or wheezing. The symptoms are aggravated by emotional upset, exercise, any activity, odors, fumes and lying flat. Risk factors include smoking. She has tried beta agonist inhalers, steroid inhalers and ipratropium inhalers for the symptoms. Her past medical history is significant for COPD.   Past Medical History:  Diagnosis Date  . Acute respiratory failure with hypoxia ([place]) 05/24/2019  . COPD (chronic obstructive pulmonary disease) ([place])   . GERD (gastroesophageal reflux disease) 12/25/2018  . 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 Left ear hearing loss 12/05/2018  . Pneumonia due to COVID-19 virus 05/24/2019     Family History  Problem Relation Age of Onset  . Hypothyroidism Mother   . Diabetes Mellitus II Maternal Aunt   . Diabetes Maternal Aunt   . Cancer Father   . Breast cancer  Neg Hx      Social History   Socioeconomic History  . Marital status: Single    Spouse name: Not on file  . Number of children: Not on file  . Years of education: Not on file  . Highest education level: Not on file  Occupational History  . Not on file  Tobacco Use  . Smoking status: Former Research scientist (life sciences)  . Smokeless tobacco: Never Used  Vaping Use  . Vaping Use: Never used  Substance and Sexual Activity  . Alcohol use: Yes    Alcohol/week: 2.0 - 3.0 standard drinks    Types: 2  - 3 Cans of beer per week    Comment: a night  . Drug use: Not Currently  . Sexual activity: Yes  Other Topics Concern  . Not on file  Social History Narrative  . Not on file   Social Determinants of Health   Financial Resource Strain:   . Difficulty of Paying Living Expenses: Not on file  Food Insecurity:   . Worried About Charity fundraiser in the Last Year: Not on file  . Ran Out of Food in the Last Year: Not on file  Transportation Needs:   . Lack of Transportation (Medical): Not on file  . Lack of Transportation (Non-Medical): Not on file  Physical Activity:   . Days of Exercise per Week: Not on file  . Minutes of Exercise per Session: Not on file  Stress:   . Feeling of Stress : Not on file  Social Connections:   . Frequency of Communication with Friends and Family: Not on file  . Frequency of Social Gatherings with Friends and Family: Not on file  . Attends Religious Services: Not on file  . Active Member of Clubs or Organizations: Not on file  . Attends Archivist Meetings: Not on file  . Marital Status: Not on file  Intimate Partner Violence:   . Fear of Current or Ex-Partner: Not on file  . Emotionally Abused: Not on file  . Physically Abused: Not on file  . Sexually Abused: Not on file     No Known Allergies   Outpatient Medications Prior to Visit  Medication Sig Dispense Refill  . albuterol (PROAIR HFA) 108 (90 Base) MCG/ACT inhaler Inhale 2 puffs into the lungs every 6 (six) hours as needed for wheezing or shortness of breath. 8.5 g 2  . albuterol (PROVENTIL) (2.5 MG/3ML) 0.083% nebulizer solution Take 3 mLs (2.5 mg total) by nebulization every 6 (six) hours as needed for wheezing or shortness of breath. 75 mL 12  . ipratropium-albuterol (DUONEB) 0.5-2.5 (3) MG/3ML SOLN Take 3 mLs by nebulization 4 (four) times daily. 360 mL 2  . OXYGEN Inhale 2 L into the lungs at bedtime.    . triamcinolone (KENALOG) 0.1 % Apply topically.    . vitamin C  (VITAMIN C) 500 MG tablet Take 1 tablet (500 mg total) by mouth daily. Please take for 10 days    . zinc sulfate 220 (50 Zn) MG capsule Take 1 capsule (220 mg total) by mouth daily. Please take for 10 days    . carbamazepine (TEGRETOL) 200 MG tablet 1 full tablet twice daily 180 tablet 1  . Fluticasone-Umeclidin-Vilant (TRELEGY ELLIPTA) 100-62.5-25 MCG/INH AEPB Take 1 puff by mouth daily. 60 each 11   No facility-administered medications prior to visit.      Review of Systems  Constitutional: Negative for fever.  HENT: Negative for ear pain, rhinorrhea and sore  throat.   Respiratory: Positive for shortness of breath. Negative for hemoptysis, sputum production and wheezing.   Cardiovascular: Negative for chest pain, orthopnea, claudication, leg swelling, syncope and PND.  Gastrointestinal: Negative for abdominal pain and vomiting.  Musculoskeletal: Negative for neck pain.  Skin: Negative for rash.  Neurological: Negative for headaches.       Objective:   Physical Exam Vitals:   11/04/20 1346  BP: (!) 157/102  Pulse: 87  SpO2: 92%  Weight: 182 lb 9.6 oz (82.8 kg)    Gen: Pleasant, well-nourished, in no distress,  normal affect  ENT: No lesions,  mouth clear,  oropharynx clear, no postnasal drip  Neck: No JVD, no TMG, no carotid bruits  Lungs: No use of accessory muscles, no dullness to percussion, distant breath sounds with prolonged expiratory phase  Cardiovascular: RRR, heart sounds normal, no murmur or gallops, no peripheral edema  Abdomen: soft and NT, no HSM,  BS normal  Musculoskeletal: No deformities, no cyanosis or clubbing  Neuro: alert, non focal  Skin: Warm, no lesions or rashes  No results found.       Assessment & Plan:  I personally reviewed all images and lab data in the [facility] system as well as any outside material available during this office visit and agree with the  radiology impressions.   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

## 2020-11-04 NOTE — Assessment & Plan Note (Signed)
History of acoustic neuroma needs refills on the Tegretol will give a 90-day supply

## 2020-11-04 NOTE — Assessment & Plan Note (Signed)
Refills on Trelegy were given to the patient

## 2020-11-04 NOTE — Assessment & Plan Note (Signed)
Varicose veins noted in the lower extremities  Patient desires referral to vascular surgery will make this referral

## 2020-11-04 NOTE — Addendum Note (Signed)
Addended by: Asencion Noble E on: 11/04/2020 04:41 PM   Modules accepted: Level of Service

## 2020-11-04 NOTE — Assessment & Plan Note (Signed)
Hyperlipidemia is diet controlled for now

## 2020-11-05 ENCOUNTER — Telehealth: Payer: Self-pay

## 2020-11-05 NOTE — Telephone Encounter (Signed)
Called the [facility] again and spoke to [place] in [employer]. She confirmed the fax number we have sent the ROI to.  She see that the pt had a colonoscopy in 2016 and a polyp was removed but there is not pathology report.  She had me re fax the Release and she will call the lab regarding getting the path associated with the procedure and will fax results when she has them.

## 2020-11-05 NOTE — Telephone Encounter (Signed)
-----   Message from Yetta Flock, MD sent at 11/04/2020  6:50 PM EST ----- Regarding: RE: does she need 32yr colonoscopY? Thanks for the follow up.  We were trying to get records of her prior colonoscopy but I don't see that they ever came in.  We will look into this and get back to you and her.  Jan, can you check and see if we ever got records of this patient's last colonoscopy so we can clarify when she is next due? Not sure if the patient can provide any further specifics about where it was done. Thanks  Richardson Landry ----- Message ----- From: Elsie Stain, MD Sent: 11/04/2020   1:49 PM EST To: Yetta Flock, MD Subject: does she need 52yr colonoscopY?                 Does she need repeat colon exam before 69yrs?? She is unsure

## 2020-11-07 NOTE — Telephone Encounter (Signed)
Records received from [place]: Colonoscopy in 02-2015 and pathology : "Large intestinal mucosa with no diagnostic changes. No true polyp identified". Records placed on Dr. Doyne Keel desk for review.

## 2020-11-07 NOTE — Telephone Encounter (Signed)
Called and Left a detailed message for Medical records that we are still waiting on records (colonoscopy and path from 2016)

## 2020-11-11 MED FILL — TRELEGY ELLIPTA 100-62.5-25: 100-62.5-25 | 30 days supply | Qty: 60 | Fill #0

## 2020-11-12 ENCOUNTER — Telehealth: Payer: Self-pay | Admitting: Gastroenterology

## 2020-11-12 NOTE — Telephone Encounter (Signed)
Records arrived from prior colonoscopy as outlined:  Colonoscopy 03/03/2015 - Dr. Jenita Seashore - good prep - 69mm sigmoid polyp removed - benign, otherwise hemorrhoids noted, no other pathology.  Not sure why she was told she needed another colonoscopy in 5 years with a good prep on this exam and no family history of colon cancer. Would recommend another colonoscopy 02/2025 unless the patient has other reasons she can think of regarding why she would need another exam sooner than that, none apparent during clinic visit. Thanks. Jan can you please let her know and place a recall

## 2020-11-17 ENCOUNTER — Telehealth: Payer: Self-pay

## 2020-11-17 MED ORDER — AZITHROMYCIN 250 MG PO TABS: ORAL_TABLET | ORAL | 0 refills | Status: DC

## 2020-11-17 NOTE — Telephone Encounter (Signed)
Called and spoke to pt.  Relayed results and recommendations.  She was relieved and very appreciative.  Recall placed for 02-2025.

## 2020-11-17 NOTE — Telephone Encounter (Signed)
Pt came into ofc requesting antibiotic, stated that Dr. Joya Gaskins knows what type of med she needs adv that I will speak to provider after clinic

## 2020-11-17 NOTE — Telephone Encounter (Signed)
Throat thick fever T 99.    COUGH.  Rx Azithromycin x 5 days

## 2020-11-19 ENCOUNTER — Telehealth: Payer: Self-pay | Admitting: Critical Care Medicine

## 2020-11-19 DIAGNOSIS — J449 Chronic obstructive pulmonary disease, unspecified: Secondary | ICD-10-CM | POA: Diagnosis not present

## 2020-11-19 NOTE — Telephone Encounter (Signed)
Disregard. Dr. Joya Gaskins addressed this issue already.

## 2020-11-19 NOTE — Telephone Encounter (Signed)
Copied from [place] [id number]. Topic: General - Other >> Nov 17, 2020  1:45 PM Rainey Pines A wrote: Patient would like a callback from Dr. Ileana Roup nurse in regards to sending in antibiotic for patients sore throat. Patient did not want to book an appointment. Please advise

## 2020-12-04 ENCOUNTER — Other Ambulatory Visit: Payer: Self-pay

## 2020-12-04 DIAGNOSIS — I83812 Varicose veins of left lower extremities with pain: Secondary | ICD-10-CM

## 2020-12-16 ENCOUNTER — Other Ambulatory Visit: Payer: Self-pay | Admitting: Critical Care Medicine

## 2020-12-16 MED FILL — TRELEGY ELLIPTA 100-62.5-25: 100-62.5-25 | 90 days supply | Qty: 180 | Fill #0

## 2020-12-16 NOTE — Telephone Encounter (Signed)
Medication:  Fluticasone-Umeclidin-Vilant (TRELEGY ELLIPTA) 100-62.5-25 MCG/INH AEPB [[id number]]   Has the patient contacted their pharmacy? No  (Agent: If no, request that the patient contact the pharmacy for the refill.)   Preferred Pharmacy (with phone number or street name):  [facility] - [place], [place] - [place] [address]  [address], [place] [address]  Phone:  [phone] Fax:  [phone]  Agent: Please be advised that RX refills may take up to 3 business days. We ask that you follow-up with your pharmacy.

## 2020-12-16 NOTE — Telephone Encounter (Signed)
Pharmacy is getting script ready

## 2020-12-17 ENCOUNTER — Encounter (HOSPITAL_COMMUNITY): Payer: Medicaid Other

## 2020-12-20 DIAGNOSIS — J449 Chronic obstructive pulmonary disease, unspecified: Secondary | ICD-10-CM | POA: Diagnosis not present

## 2021-01-05 ENCOUNTER — Encounter (HOSPITAL_COMMUNITY): Payer: Medicaid Other

## 2021-01-19 ENCOUNTER — Encounter (HOSPITAL_COMMUNITY): Payer: Medicaid Other

## 2021-01-20 DIAGNOSIS — J449 Chronic obstructive pulmonary disease, unspecified: Secondary | ICD-10-CM | POA: Diagnosis not present

## 2021-01-23 ENCOUNTER — Telehealth: Payer: Self-pay | Admitting: Critical Care Medicine

## 2021-01-23 NOTE — Telephone Encounter (Signed)
Copied from [place] [phone]. Topic: General - Other >> Jan 21, 2021  2:50 PM Leward Quan A wrote: Reason for CRM: Patient called in to inquire if Dr Joya Gaskins can schedule her for some blood work say that she need a complete CBC and also need to be checked for heart disease. Please call patient at Ph#   [phone]

## 2021-01-26 ENCOUNTER — Ambulatory Visit: Payer: Self-pay | Admitting: *Deleted

## 2021-01-26 NOTE — Telephone Encounter (Signed)
Left voicemail stating request was received and could discuss at next OV. She could call office if needed at [phone]

## 2021-01-26 NOTE — Telephone Encounter (Signed)
Patient is calling to report she is getting high BP readings for 1 1/2 weeks. Patient is not on BP medication. Patient is using wrist cuff to check BP- advised of symptoms that indicate she needs to be seen immediately- she is aware- patient advised UC- but she wants to monitor her BP and states she will go if she has changes- she has doctor appointment tomorrow with specialist. Reason for Disposition . [6] Systolic BP  >= 834 OR Diastolic >= 196  AND [2] having NO cardiac or neurologic symptoms  Answer Assessment - Initial Assessment Questions 1. BLOOD PRESSURE: "What is the blood pressure?" "Did you take at least two measurements 5 minutes apart?"     [id number], 178/127 P 83 2. ONSET: "When did you take your blood pressure?"     3:45, 4:16 3. HOW: "How did you obtain the blood pressure?" (e.g., visiting nurse, automatic home BP monitor)     Wrist cuff 4. HISTORY: "Do you have a history of high blood pressure?"     no 5. MEDICATIONS: "Are you taking any medications for blood pressure?" "Have you missed any doses recently?"     no 6. OTHER SYMPTOMS: "Do you have any symptoms?" (e.g., headache, chest pain, blurred vision, difficulty breathing, weakness)     1 1/2 weeks- headache daily 7. PREGNANCY: "Is there any chance you are pregnant?" "When was your last menstrual period?"     n/a  Protocols used: BLOOD PRESSURE - HIGH-A-AH

## 2021-01-26 NOTE — Telephone Encounter (Signed)
Summary: clinical advice   PT called in stating she has high blood pressure of 171/116. Due to red words I tried to contact nurse triage but while on hold for a nurse the PT hung up. Unable to reach PT. Please follow up as patient was concerned regarding her high blood pressure reading.

## 2021-01-27 ENCOUNTER — Encounter: Payer: Self-pay | Admitting: Neurology

## 2021-01-27 ENCOUNTER — Ambulatory Visit: Payer: Medicaid Other | Admitting: Neurology

## 2021-01-27 VITALS — BP 152/95 | HR 84 | Ht 70.0 in | Wt 182.0 lb

## 2021-01-27 DIAGNOSIS — Z86018 Personal history of other benign neoplasm: Secondary | ICD-10-CM | POA: Diagnosis not present

## 2021-01-27 DIAGNOSIS — H9192 Unspecified hearing loss, left ear: Secondary | ICD-10-CM | POA: Diagnosis not present

## 2021-01-27 NOTE — Progress Notes (Signed)
PATIENT: Madison Phillips DOB: 23-Feb-1966  REASON FOR VISIT: follow up HISTORY FROM: patient  HISTORY OF PRESENT ILLNESS: Today 01/27/21 Madison Phillips is a 55 year old female with history of acoustic neuroma in 2011, had gamma knife procedure, 6 months afterward had hemifacial spasm on the left.  Cannot tolerate gabapentin due to drowsiness/dry mouth.  Doing well on carbamazepine 200 mg twice a day.  She has no hearing in the left ear.  Left facial pain is well controlled.  Today, reports had a headache for 1.5 weeks, that was generalized, there was no weakness, dizziness, blurry vision, or speech disturbance.  She took Excedrin and Motrin with good benefit.  She decided to check her blood pressure, found her blood pressure was elevated around 171/116, called her PCP, told to go to urgent care.  3 days ago, the headache went away.  States she feels fine, other than the headache was asymptomatic.  Is not on BP medications.  Under a lot of stress in her personal life, eating more salty food, such as pickles.  She feels great today, has no complaints, no headache.  I checked her manual BP 140/90, left arm.  She cannot find previous MRI disc, was done in [place] in 2011.  Here today for evaluation unaccompanied.  HISTORY  03/26/2020 SS: Madison Phillips is a 55 year old female with history of acoustic neuroma in 2011, had gamma knife procedure, 6 months afterward had hemifacial spasm on the left.  She could not tolerate gabapentin due to drowsiness and dry mouth, says it felt like a brick on her face.  She was started on carbamazepine, now taking 200 mg twice a day.  She no longer has any pain or spasms to the left side of her face with carbamazepine.  If she misses the medication for a few days, she will have left hemi facial spasm.  Says has been dieting for 1 month, only lost 3 lbs, wonder if related to carbamazepine?  A few weeks ago, had a headache for 2 weeks, but went away. Overall, feeling good, pleased  with medication efficacy.  She has COPD. She has no hearing in the left ear. She presents today for follow-up unaccompanied.  REVIEW OF SYSTEMS: Out of a complete 14 system review of symptoms, the patient complains only of the following symptoms, and all other reviewed systems are negative.  N/A  ALLERGIES: No Known Allergies  HOME MEDICATIONS: Outpatient Medications Prior to Visit  Medication Sig Dispense Refill  . albuterol (PROAIR HFA) 108 (90 Base) MCG/ACT inhaler Inhale 2 puffs into the lungs every 6 (six) hours as needed for wheezing or shortness of breath. 8.5 g 2  . carbamazepine (TEGRETOL) 200 MG tablet 1 full tablet twice daily 180 tablet 1  . Fluticasone-Umeclidin-Vilant (TRELEGY ELLIPTA) 100-62.5-25 MCG/INH AEPB Take 1 puff by mouth daily. 180 each 1  . OXYGEN Inhale 2 L into the lungs at bedtime.    . triamcinolone (KENALOG) 0.1 % Apply topically.    . vitamin C (VITAMIN C) 500 MG tablet Take 1 tablet (500 mg total) by mouth daily. Please take for 10 days    . zinc sulfate 220 (50 Zn) MG capsule Take 1 capsule (220 mg total) by mouth daily. Please take for 10 days    . ipratropium-albuterol (DUONEB) 0.5-2.5 (3) MG/3ML SOLN Take 3 mLs by nebulization 4 (four) times daily. 360 mL 2  . albuterol (PROVENTIL) (2.5 MG/3ML) 0.083% nebulizer solution Take 3 mLs (2.5 mg total) by nebulization every 6 (  six) hours as needed for wheezing or shortness of breath. 75 mL 12  . azithromycin (ZITHROMAX) 250 MG tablet Take two once then one daily until gone 6 tablet 0   No facility-administered medications prior to visit.    PAST MEDICAL HISTORY: Past Medical History:  Diagnosis Date  . Acute respiratory failure with hypoxia ([place]) 05/24/2019  . COPD (chronic obstructive pulmonary disease) ([place])   . GERD (gastroesophageal reflux disease) 12/25/2018  . 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 Left ear hearing loss 12/05/2018  . Pneumonia due to COVID-19 virus 05/24/2019    PAST SURGICAL  HISTORY: Past Surgical History:  Procedure Laterality Date  . cyber knife    . ectopic pregnanacy      FAMILY HISTORY: Family History  Problem Relation Age of Onset  . Hypothyroidism Mother   . Diabetes Mellitus II Maternal Aunt   . Diabetes Maternal Aunt   . Cancer Father   . Breast cancer Neg Hx     SOCIAL HISTORY: Social History   Socioeconomic History  . Marital status: Single    Spouse name: Not on file  . Number of children: Not on file  . Years of education: Not on file  . Highest education level: Not on file  Occupational History  . Not on file  Tobacco Use  . Smoking status: Former Research scientist (life sciences)  . Smokeless tobacco: Never Used  Vaping Use  . Vaping Use: Never used  Substance and Sexual Activity  . Alcohol use: Yes    Alcohol/week: 2.0 - 3.0 standard drinks    Types: 2 - 3 Cans of beer per week    Comment: a night  . Drug use: Not Currently  . Sexual activity: Yes  Other Topics Concern  . Not on file  Social History Narrative  . Not on file   Social Determinants of Health   Financial Resource Strain: Not on file  Food Insecurity: Not on file  Transportation Needs: Not on file  Physical Activity: Not on file  Stress: Not on file  Social Connections: Not on file  Intimate Partner Violence: Not on file   PHYSICAL EXAM  Vitals:   01/27/21 0752  BP: (!) 152/95  Pulse: 84  Weight: 182 lb (82.6 kg)  Height: 5\' 10"  (1.778 m)   Body mass index is 26.11 kg/m.  Generalized: Well developed, in no acute distress, well-appearing, laughing and in good spirits Neurological examination  Mentation: Alert oriented to time, place, history taking. Follows all commands speech and language fluent Cranial nerve II-XII: Pupils were equal round reactive to light. Extraocular movements were full, visual field were full on confrontational test. Facial sensation and strength were normal. Head turning and shoulder shrug  were normal and symmetric. Motor: The motor testing  reveals 5 over 5 strength of all 4 extremities. Good symmetric motor tone is noted throughout.  Sensory: Sensory testing is intact to soft touch on all 4 extremities. No evidence of extinction is noted.  Coordination: Cerebellar testing reveals good finger-nose-finger and heel-to-shin bilaterally.  Gait and station: Gait is normal. Tandem gait is normal. Romberg is negative. No drift is seen.  Reflexes: Deep tendon reflexes are symmetric and normal bilaterally.   DIAGNOSTIC DATA (LABS, IMAGING, TESTING) - I reviewed patient records, labs, notes, testing and imaging myself where available.  Lab Results  Component Value Date   WBC 6.8 03/26/2020   HGB 14.9 03/26/2020   HCT 44.0 03/26/2020   MCV 96 03/26/2020   PLT 197 03/26/2020  Component Value Date/Time   NA 143 03/26/2020 0903   K 4.6 03/26/2020 0903   CL 107 (H) 03/26/2020 0903   CO2 24 03/26/2020 0903   GLUCOSE 90 03/26/2020 0903   GLUCOSE 69 (L) 05/27/2019 0400   BUN 15 03/26/2020 0903   CREATININE 0.89 03/26/2020 0903   CALCIUM 9.3 03/26/2020 0903   PROT 6.8 03/26/2020 0903   ALBUMIN 4.4 03/26/2020 0903   AST 21 03/26/2020 0903   ALT 20 03/26/2020 0903   ALKPHOS 98 03/26/2020 0903   BILITOT 0.2 03/26/2020 0903   GFRNONAA 73 03/26/2020 0903   GFRAA 84 03/26/2020 0903   No results found for: CHOL, HDL, LDLCALC, LDLDIRECT, TRIG, CHOLHDL Lab Results  Component Value Date   HGBA1C 5.4 12/24/2018   No results found for: VITAMINB12 Lab Results  Component Value Date   TSH 0.537 12/05/2018   ASSESSMENT AND PLAN 55 y.o. year old female  has a past medical history of Acute respiratory failure with hypoxia ([place]) (05/24/2019), COPD (chronic obstructive pulmonary disease) ([place]), GERD (gastroesophageal reflux disease) (12/25/2018), History of acoustic neuroma (12/05/2018), Left ear hearing loss (12/05/2018), and Pneumonia due to COVID-19 virus (05/24/2019). here with:  1.  History of left acoustic neuroma, status post gamma  knife procedure 2.  Left hemifacial spasm 3.  Left-sided headache 4.  Elevated blood pressure, with headache  -Currently no headache for the last 3 days, manual BP today 140/90 -Recommend she keep BP log, checking 3 times daily for her PCP to review at her next appointment 02/10/21; if blood pressure is elevated, is symptomatic, go to ER -If headache returns, will recheck MRI of the brain with and without contrast, offered this today, wants to hold off, seems headache was BP related, poor diet choices, personal stress -Has not been able to locate previous MRI disc from 2011 in [facility] -Her neurological exam is normal, she is well-appearing -Left hemifacial spasm is well controlled with carbamazepine, we will continue this, labs were unremarkable in April 2021, carbamazepine level was 4.5 -Call for recurrent headache, worsening symptoms I will repeat MRI brain, otherwise follow-up 6 months or sooner if needed, will let her see Dr. Jannifer Franklin, she has missed her last appointment with him  MRI of the brain with and without contrast May 2020 IMPRESSION: This MRI of the brain with and without contrast shows the following: 1.  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 2.   There is a normal enhancement pattern elsewhere in the brain. 3.   Mild chronic microvascular ischemic changes.  There were no acute findings.  I spent 30 minutes of face-to-face and non-face-to-face time with patient.  This included previsit chart review, lab review, study review, order entry, electronic health record documentation, patient education.  Butler Denmark, AGNP-C, DNP 01/27/2021, 8:18 AM [facility] [address] [place], [place] [address] [phone]

## 2021-01-27 NOTE — Progress Notes (Signed)
I have read the note, and I agree with the clinical assessment and plan.  Andreina Outten K Dorcas Melito   

## 2021-01-27 NOTE — Patient Instructions (Signed)
Please check BP 3 times daily if consistently elevated > 140, need to see PCP Keep appointment in Feb with PCP  Go to the ER if worsening symptoms Continue Tegretol for facial pain  See you back in 6 months Call me if headaches return, will repeat MRI

## 2021-02-10 ENCOUNTER — Ambulatory Visit: Payer: Medicaid Other | Admitting: Critical Care Medicine

## 2021-02-17 DIAGNOSIS — J449 Chronic obstructive pulmonary disease, unspecified: Secondary | ICD-10-CM | POA: Diagnosis not present

## 2021-03-10 ENCOUNTER — Encounter: Payer: Self-pay | Admitting: Critical Care Medicine

## 2021-03-10 ENCOUNTER — Other Ambulatory Visit: Payer: Self-pay

## 2021-03-10 ENCOUNTER — Ambulatory Visit: Payer: Medicaid Other | Attending: Critical Care Medicine | Admitting: Critical Care Medicine

## 2021-03-10 ENCOUNTER — Other Ambulatory Visit: Payer: Self-pay | Admitting: Critical Care Medicine

## 2021-03-10 VITALS — BP 158/112 | HR 77 | Resp 16 | Wt 184.6 lb

## 2021-03-10 DIAGNOSIS — R079 Chest pain, unspecified: Secondary | ICD-10-CM

## 2021-03-10 DIAGNOSIS — H905 Unspecified sensorineural hearing loss: Secondary | ICD-10-CM | POA: Diagnosis not present

## 2021-03-10 DIAGNOSIS — Z136 Encounter for screening for cardiovascular disorders: Secondary | ICD-10-CM

## 2021-03-10 DIAGNOSIS — J9611 Chronic respiratory failure with hypoxia: Secondary | ICD-10-CM

## 2021-03-10 DIAGNOSIS — J449 Chronic obstructive pulmonary disease, unspecified: Secondary | ICD-10-CM | POA: Diagnosis not present

## 2021-03-10 DIAGNOSIS — I10 Essential (primary) hypertension: Secondary | ICD-10-CM

## 2021-03-10 MED ORDER — IPRATROPIUM-ALBUTEROL 0.5-2.5 (3) MG/3ML IN SOLN: 3.0000 mL | Freq: Four times a day (QID) | RESPIRATORY_TRACT | 2 refills | Status: DC

## 2021-03-10 MED ORDER — VALSARTAN-HYDROCHLOROTHIAZIDE 160-12.5 MG PO TABS: 1.0000 | ORAL_TABLET | Freq: Every day | ORAL | 1 refills | Status: DC

## 2021-03-10 MED ORDER — TRELEGY ELLIPTA 100-62.5-25 MCG/INH IN AEPB: 1.0000 | INHALATION_SPRAY | Freq: Every day | RESPIRATORY_TRACT | 1 refills | Status: DC

## 2021-03-10 MED ORDER — CARBAMAZEPINE 200 MG PO TABS: ORAL_TABLET | ORAL | 1 refills | Status: DC

## 2021-03-10 MED FILL — IPRAT-ALBUT 0.5-3(2.5) MG/3: 0.5-2.5 (3) | 30 days supply | Qty: 360 | Fill #0

## 2021-03-10 MED FILL — VALSARTAN-HCTZ 160-12.5 MG: 160-12.5 | 60 days supply | Qty: 60 | Fill #0

## 2021-03-10 MED FILL — TRELEGY ELLIPTA 100-62.5-25: 100-62.5-25 | 90 days supply | Qty: 180 | Fill #0

## 2021-03-10 MED FILL — TEGretol 200 MG TABS: 200 | 90 days supply | Qty: 180 | Fill #0

## 2021-03-10 NOTE — Progress Notes (Signed)
Subjective:    Patient ID: Madison Phillips, female    DOB: [birth year], 55 y.o.   MRN: [id number]  12/23/19 This is a 55 year old female with gold stage D COPD with chronic hypoxic respiratory failure and centrilobular emphysema.  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 Since the last visit in December which was a video visit the patient has improved on Trelegy.  The patient is maintaining oxygen 2 L at rest exertion oxygen off a when she sat.  Patient denies lower extremity edema or headaches.  Patient is no longer smoking at this time is up-to-date on all her vaccines  The patient has been compliant with her oxygen in today's visit in part is to recertify her for her oxygen therapy  07/07/2020 Patient is seen again return follow-up and notes improvement in breathing.  Activity level is increasing.  She would like to go back to work at this time..  Note the patient is declining a Covid vaccine.  She is interested in obtaining a mammogram.  11/04/2020 This patient is seen in return follow-up for severe COPD Gold stage D.  Note she is declining Covid and flu vaccines due to religious reasons.  Her largest complaint today is left sided upper thigh pain which is medial in nature.  She has significant varicose veins.  Note the patient also comes in on arrival blood pressure 157/102 and she said she had 2 glasses of wine at lunch.  She states her breathing is at baseline and she is on the Trelegy inhaler and tolerating it well.  She is wearing oxygen 2 L at night only.  Her reflux disease is stable as well. The patient states at home her blood pressure readings are much lower in the 130/80 range. The patient was referred for colonoscopy because of a question of a colon polyp.  The GI surgeon decided to hold off on a colonoscopy and was seeking outside records.  The patient is unaware whether the records have been received as to whether she had a colon  polyp or not when she had her last colonoscopy about 6 years ago.  The patient asked me today to inquire if the records have been received.  I was not able to determine that by looking in the patient's electronic record  03/10/2021 Patient seen return follow-up on arrival blood pressure was 168/94 she notes increased shortness of breath and wheezing she is having decreased hearing in the left ear with prior history of acoustic neuroma she wishes to see ENT for audiology evaluations she also wishes to have a cardiology screening  See shortness of breath assessment  Shortness of Breath This is a chronic problem. The current episode started more than 1 year ago. The problem occurs daily. The problem has been gradually worsening. Associated symptoms include wheezing. Pertinent negatives include no abdominal pain, chest pain, claudication, coryza, ear pain, fever, headaches, hemoptysis, leg pain, leg swelling, neck pain, orthopnea, PND, rash, rhinorrhea, sore throat, sputum production, swollen glands, syncope or vomiting. The symptoms are aggravated by emotional upset, exercise, any activity, odors, fumes and lying flat. Risk factors include smoking. She has tried beta agonist inhalers, steroid inhalers and ipratropium inhalers for the symptoms. Her past medical history is significant for COPD.   Past Medical History:  Diagnosis Date  . Acute respiratory failure with hypoxia ([place]) 05/24/2019  . COPD (chronic obstructive pulmonary disease) ([place])   . GERD (gastroesophageal reflux disease) 12/25/2018  .  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 Left ear hearing loss 12/05/2018  . Pneumonia due to COVID-19 virus 05/24/2019     Family History  Problem Relation Age of Onset  . Hypothyroidism Mother   . Diabetes Mellitus II Maternal Aunt   . Diabetes Maternal Aunt   . Cancer Father   . Breast cancer Neg Hx      Social History   Socioeconomic History  . Marital status: Single    Spouse name: Not on file   . Number of children: Not on file  . Years of education: Not on file  . Highest education level: Not on file  Occupational History  . Not on file  Tobacco Use  . Smoking status: Former Research scientist (life sciences)  . Smokeless tobacco: Never Used  Vaping Use  . Vaping Use: Never used  Substance and Sexual Activity  . Alcohol use: Yes    Alcohol/week: 2.0 - 3.0 standard drinks    Types: 2 - 3 Cans of beer per week    Comment: a night  . Drug use: Not Currently  . Sexual activity: Yes  Other Topics Concern  . Not on file  Social History Narrative  . Not on file   Social Determinants of Health   Financial Resource Strain: Not on file  Food Insecurity: Not on file  Transportation Needs: Not on file  Physical Activity: Not on file  Stress: Not on file  Social Connections: Not on file  Intimate Partner Violence: Not on file     No Known Allergies   Outpatient Medications Prior to Visit  Medication Sig Dispense Refill  . albuterol (PROAIR HFA) 108 (90 Base) MCG/ACT inhaler Inhale 2 puffs into the lungs every 6 (six) hours as needed for wheezing or shortness of breath. 8.5 g 2  . OXYGEN Inhale 2 L into the lungs at bedtime.    . vitamin C (VITAMIN C) 500 MG tablet Take 1 tablet (500 mg total) by mouth daily. Please take for 10 days    . zinc sulfate 220 (50 Zn) MG capsule Take 1 capsule (220 mg total) by mouth daily. Please take for 10 days    . carbamazepine (TEGRETOL) 200 MG tablet 1 full tablet twice daily 180 tablet 1  . Fluticasone-Umeclidin-Vilant (TRELEGY ELLIPTA) 100-62.5-25 MCG/INH AEPB Inhale 1 puff into the lungs daily.    Marland Kitchen ipratropium-albuterol (DUONEB) 0.5-2.5 (3) MG/3ML SOLN Take 3 mLs by nebulization 4 (four) times daily. 360 mL 2  . triamcinolone (KENALOG) 0.1 % Apply topically.     No facility-administered medications prior to visit.      Review of Systems  Constitutional: Negative for fever.  HENT: Negative for ear pain, rhinorrhea and sore throat.   Respiratory: Positive  for shortness of breath and wheezing. Negative for hemoptysis and sputum production.   Cardiovascular: Negative for chest pain, orthopnea, claudication, leg swelling, syncope and PND.  Gastrointestinal: Negative for abdominal pain and vomiting.  Musculoskeletal: Negative for neck pain.  Skin: Negative for rash.  Neurological: Negative for headaches.       Objective:   Physical Exam  Vitals:   03/10/21 1356  BP: (!) 158/112  Pulse: 77  Resp: 16  SpO2: 90%  Weight: 184 lb 9.6 oz (83.7 kg)    Gen: Pleasant, well-nourished, in no distress,  normal affect  ENT: No lesions,  mouth clear,  oropharynx clear, no postnasal drip  Neck: No JVD, no TMG, no carotid bruits  Lungs: No use of accessory muscles, no  dullness to percussion, distant breath sounds with prolonged expiratory phase  Cardiovascular: RRR, heart sounds normal, no murmur or gallops, no peripheral edema  Abdomen: soft and NT, no HSM,  BS normal  Musculoskeletal: No deformities, no cyanosis or clubbing  Neuro: alert, non focal  Skin: Warm, no lesions or rashes  No results found.       Assessment & Plan:  I personally reviewed all images and lab data in the [facility] system as well as any outside material available during this office visit and agree with the  radiology impressions.   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 ([place]) COPD with severe obstruction noted January 2020  Repeat pulmonary function testing not indicated  Refill Trelegy  Chronic respiratory failure with hypoxia ([facility]) Continue oxygen 2 L at night  Hypertension Begin therapy for hypertension with valsartan HCT 160/12.5  Left ear hearing loss Referral back to ENT for evaluation   Diagnoses and all orders for this visit:  Primary hypertension -     CBC With Diff/Platelet -     Lipid Panel -     Comprehensive metabolic panel -     Ambulatory referral to Cardiology  Sensorineural hearing loss (SNHL) of left ear, unspecified  hearing status on contralateral side -     Ambulatory referral to ENT  Chest pain, unspecified type  Ischemic heart disease screen -     Ambulatory referral to Cardiology  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 ([place])  Chronic respiratory failure with hypoxia ([place])  Other orders -     Fluticasone-Umeclidin-Vilant (TRELEGY ELLIPTA) 100-62.5-25 MCG/INH AEPB; Inhale 1 puff into the lungs daily. -     ipratropium-albuterol (DUONEB) 0.5-2.5 (3) MG/3ML SOLN; Take 3 mLs by nebulization 4 (four) times daily. -     carbamazepine (TEGRETOL) 200 MG tablet; 1 full tablet twice daily -     valsartan-hydrochlorothiazide (DIOVAN HCT) 160-12.5 MG tablet; Take 1 tablet by mouth daily.  Plan referral to cardiology for heart disease screening

## 2021-03-10 NOTE — Assessment & Plan Note (Signed)
Continue oxygen 2 L at night.  

## 2021-03-10 NOTE — Assessment & Plan Note (Signed)
Referral back to ENT for evaluation

## 2021-03-10 NOTE — Progress Notes (Signed)
Pt states she is needing blood work  Pt is also requesting to have a breathing test    Pt is requesting to get check for heart disease

## 2021-03-10 NOTE — Assessment & Plan Note (Addendum)
Begin therapy for hypertension with valsartan HCT 160/12.5

## 2021-03-10 NOTE — Assessment & Plan Note (Signed)
COPD with severe obstruction noted January 2020  Repeat pulmonary function testing not indicated  Refill Trelegy

## 2021-03-10 NOTE — Patient Instructions (Addendum)
Referral sent to ENT to assess your left ear  Referral sent to cardiology to screen you for heart disease Refill on all medication sent to the pharmacy , including three month supply Trelegy Start Valsartan HCT one daily for blood pressure.  Return Dr Joya Gaskins 3 months  Use oxygen 2 Liters at night

## 2021-03-12 ENCOUNTER — Other Ambulatory Visit: Payer: Self-pay | Admitting: Critical Care Medicine

## 2021-03-12 DIAGNOSIS — E7841 Elevated Lipoprotein(a): Secondary | ICD-10-CM

## 2021-03-12 LAB — CBC WITH DIFF/PLATELET
Basophils Absolute: 0 10*3/uL (ref 0.0–0.2)
Basos: 0 %
EOS (ABSOLUTE): 0.1 10*3/uL (ref 0.0–0.4)
Eos: 1 %
Hematocrit: 44.9 % (ref 34.0–46.6)
Hemoglobin: 15.4 g/dL (ref 11.1–15.9)
Immature Grans (Abs): 0 10*3/uL (ref 0.0–0.1)
Immature Granulocytes: 0 %
Lymphocytes Absolute: 2.1 10*3/uL (ref 0.7–3.1)
Lymphs: 23 %
MCH: 31.3 pg (ref 26.6–33.0)
MCHC: 34.3 g/dL (ref 31.5–35.7)
MCV: 91 fL (ref 79–97)
Monocytes Absolute: 0.5 10*3/uL (ref 0.1–0.9)
Monocytes: 6 %
Neutrophils Absolute: 6.4 10*3/uL (ref 1.4–7.0)
Neutrophils: 70 %
Platelets: 217 10*3/uL (ref 150–450)
RBC: 4.92 x10E6/uL (ref 3.77–5.28)
RDW: 13.1 % (ref 11.7–15.4)
WBC: 9 10*3/uL (ref 3.4–10.8)

## 2021-03-12 LAB — COMPREHENSIVE METABOLIC PANEL
ALT: 16 IU/L (ref 0–32)
AST: 16 IU/L (ref 0–40)
Albumin/Globulin Ratio: 1.9 (ref 1.2–2.2)
Albumin: 4.5 g/dL (ref 3.8–4.9)
Alkaline Phosphatase: 113 IU/L (ref 44–121)
BUN/Creatinine Ratio: 20 (ref 9–23)
BUN: 18 mg/dL (ref 6–24)
Bilirubin Total: <0.2 mg/dL (ref 0.0–1.2)
CO2: 24 mmol/L (ref 20–29)
Calcium: 9.5 mg/dL (ref 8.7–10.2)
Chloride: 103 mmol/L (ref 96–106)
Creatinine, Ser: 0.89 mg/dL (ref 0.57–1.00)
Globulin, Total: 2.4 g/dL (ref 1.5–4.5)
Glucose: 85 mg/dL (ref 65–99)
Potassium: 4.5 mmol/L (ref 3.5–5.2)
Sodium: 146 mmol/L — ABNORMAL HIGH (ref 134–144)
Total Protein: 6.9 g/dL (ref 6.0–8.5)
eGFR: 76 mL/min/{1.73_m2} (ref >59)

## 2021-03-12 LAB — LIPID PANEL
Chol/HDL Ratio: 2.8 ratio (ref 0.0–4.4)
Cholesterol, Total: 212 mg/dL — ABNORMAL HIGH (ref 100–199)
HDL: 75 mg/dL (ref >39)
LDL Chol Calc (NIH): 122 mg/dL — ABNORMAL HIGH (ref 0–99)
Triglycerides: 84 mg/dL (ref 0–149)
VLDL Cholesterol Cal: 15 mg/dL (ref 5–40)

## 2021-03-12 MED ORDER — ATORVASTATIN CALCIUM 10 MG PO TABS: 10.0000 mg | ORAL_TABLET | Freq: Every day | ORAL | 3 refills | Status: DC

## 2021-03-12 MED FILL — ATORVASTATIN 10 MG TABLET: 10 | 90 days supply | Qty: 90 | Fill #0

## 2021-03-13 ENCOUNTER — Telehealth: Payer: Self-pay

## 2021-03-13 NOTE — Telephone Encounter (Signed)
Contacted pt to go over lab results pt didn't answer lvm  

## 2021-03-21 ENCOUNTER — Other Ambulatory Visit: Payer: Self-pay

## 2021-03-27 ENCOUNTER — Other Ambulatory Visit: Payer: Self-pay

## 2021-03-27 MED FILL — Atorvastatin Calcium Tab 10 MG (Base Equivalent): ORAL | 90 days supply | Qty: 90 | Fill #0 | Status: AC

## 2021-04-16 ENCOUNTER — Ambulatory Visit: Payer: Medicaid Other | Admitting: Cardiology

## 2021-05-12 ENCOUNTER — Other Ambulatory Visit: Payer: Self-pay

## 2021-05-12 MED FILL — Fluticasone-Umeclidinium-Vilanterol AEPB 100-62.5-25 MCG/ACT: RESPIRATORY_TRACT | 90 days supply | Qty: 180 | Fill #0 | Status: CN

## 2021-05-12 MED FILL — Ipratropium-Albuterol Nebu Soln 0.5-2.5(3) MG/3ML: RESPIRATORY_TRACT | 30 days supply | Qty: 360 | Fill #0 | Status: AC

## 2021-05-12 MED FILL — Valsartan-Hydrochlorothiazide Tab 160-12.5 MG: ORAL | 60 days supply | Qty: 60 | Fill #0 | Status: AC

## 2021-05-18 MED FILL — Fluticasone-Umeclidinium-Vilanterol AEPB 100-62.5-25 MCG/ACT: RESPIRATORY_TRACT | 90 days supply | Qty: 180 | Fill #0 | Status: CN

## 2021-05-19 ENCOUNTER — Other Ambulatory Visit: Payer: Self-pay

## 2021-05-20 ENCOUNTER — Other Ambulatory Visit: Payer: Self-pay

## 2021-05-27 ENCOUNTER — Other Ambulatory Visit: Payer: Self-pay

## 2021-06-02 ENCOUNTER — Other Ambulatory Visit: Payer: Self-pay

## 2021-06-02 ENCOUNTER — Encounter: Payer: Self-pay | Admitting: Cardiology

## 2021-06-02 ENCOUNTER — Ambulatory Visit (INDEPENDENT_AMBULATORY_CARE_PROVIDER_SITE_OTHER): Payer: Medicaid Other | Admitting: Cardiology

## 2021-06-02 VITALS — BP 106/74 | HR 87 | Ht 70.0 in | Wt 185.2 lb

## 2021-06-02 DIAGNOSIS — E7800 Pure hypercholesterolemia, unspecified: Secondary | ICD-10-CM

## 2021-06-02 DIAGNOSIS — Z7189 Other specified counseling: Secondary | ICD-10-CM

## 2021-06-02 DIAGNOSIS — I10 Essential (primary) hypertension: Secondary | ICD-10-CM | POA: Diagnosis not present

## 2021-06-02 NOTE — Progress Notes (Signed)
Cardiology CONSULT Note    Date:  06/02/2021   ID:  [name], DOB Oct 26, 1966, MRN [id number]  PCP:  Elsie Stain, MD  Cardiologist:  Fransico Him, MD   Chief Complaint  Patient presents with   New Patient (Initial Visit)    Cardiac risk assessment, HTN and HLD    History of Present Illness:  Shamiyah Ngu is a 55 y.o. female who is being seen today for the evaluation of risk factors for CAD at the request of Elsie Stain, MD.  This is a 55yo AAF with a hx of COPD on chronic O2 at night, PNA due to COVID 19 in 2020, HTN who is referred to screening to rule out ischemic heart disease based on CRFs including HTN and former smoking.  She denies any chest pain or pressure, PND, orthopnea, LE edema, dizziness, palpitations or syncope. She has chronic DOE from COPD that is stable.  She is compliant with her meds and is tolerating meds with no SE.     Past Medical History:  Diagnosis Date   Acute respiratory failure with hypoxia ([place]) 05/24/2019   COPD (chronic obstructive pulmonary disease) ([facility])    GERD (gastroesophageal reflux disease) 12/25/2018   History of acoustic neuroma 12/05/2018   Left ear hearing loss 12/05/2018   Pneumonia due to COVID-19 virus 05/24/2019    Past Surgical History:  Procedure Laterality Date   cyber knife     ectopic pregnanacy      Current Medications: Current Meds  Medication Sig   albuterol (PROAIR HFA) 108 (90 Base) MCG/ACT inhaler Inhale 2 puffs into the lungs every 6 (six) hours as needed for wheezing or shortness of breath.   atorvastatin (LIPITOR) 10 MG tablet TAKE 1 TABLET (10 MG TOTAL) BY MOUTH DAILY.   carbamazepine (TEGRETOL) 200 MG tablet TAKE 1 TABLET BY MOUTH TWICE DAILY   Fluticasone-Umeclidin-Vilant 100-62.5-25 MCG/INH AEPB INHALE 1 PUFF INTO THE LUNGS DAILY.   Fluticasone-Umeclidin-Vilant 100-62.5-25 MCG/INH AEPB TAKE 1 PUFF BY MOUTH DAILY.   ipratropium-albuterol (DUONEB) 0.5-2.5 (3) MG/3ML SOLN TAKE 3 MLS BY  NEBULIZATION 4 (FOUR) TIMES DAILY.   OXYGEN Inhale 2 L into the lungs at bedtime.   triamcinolone (KENALOG) 0.1 % Apply topically.   valsartan-hydrochlorothiazide (DIOVAN-HCT) 160-12.5 MG tablet TAKE 1 TABLET BY MOUTH DAILY.   vitamin C (VITAMIN C) 500 MG tablet Take 1 tablet (500 mg total) by mouth daily. Please take for 10 days   zinc sulfate 220 (50 Zn) MG capsule Take 1 capsule (220 mg total) by mouth daily. Please take for 10 days    Allergies:   Patient has no known allergies.   Social History   Socioeconomic History   Marital status: Single    Spouse name: Not on file   Number of children: Not on file   Years of education: Not on file   Highest education level: Not on file  Occupational History   Not on file  Tobacco Use   Smoking status: Former    Pack years: 0.00   Smokeless tobacco: Never  Vaping Use   Vaping Use: Never used  Substance and Sexual Activity   Alcohol use: Yes    Alcohol/week: 2.0 - 3.0 standard drinks    Types: 2 - 3 Cans of beer per week    Comment: a night   Drug use: Not Currently   Sexual activity: Yes  Other Topics Concern   Not on file  Social History Narrative   Not  on file   Social Determinants of Health   Financial Resource Strain: Not on file  Food Insecurity: Not on file  Transportation Needs: Not on file  Physical Activity: Not on file  Stress: Not on file  Social Connections: Not on file     Family History:  The patient's family history includes Cancer in her father; Diabetes in her maternal aunt; Diabetes Mellitus II in her maternal aunt; Hypothyroidism in her mother.   ROS:   Please see the history of present illness.    ROS All other systems reviewed and are negative.  No flowsheet data found.     PHYSICAL EXAM:   VS:  BP 106/74   Pulse 87   Ht 5\' 10"  (1.778 m)   Wt 185 lb 3.2 oz (84 kg)   SpO2 (!) 88%   BMI 26.57 kg/m    GEN: Well nourished, well developed, in no acute distress  HEENT: normal  Neck: no JVD,  carotid bruits, or masses Cardiac: RRR; no murmurs, rubs, or gallops,no edema.  Intact distal pulses bilaterally.  Respiratory:  clear to auscultation bilaterally, normal work of breathing GI: soft, nontender, nondistended, + BS MS: no deformity or atrophy  Skin: warm and dry, no rash Neuro:  Alert and Oriented x 3, Strength and sensation are intact Psych: euthymic mood, full affect  Wt Readings from Last 3 Encounters:  06/02/21 185 lb 3.2 oz (84 kg)  03/10/21 184 lb 9.6 oz (83.7 kg)  01/27/21 182 lb (82.6 kg)      Studies/Labs Reviewed:   EKG:  EKG is ordered today.  The ekg ordered today demonstrates NSR with no ST changes  Recent Labs: 03/10/2021: ALT 16; BUN 18; Creatinine, Ser 0.89; Hemoglobin 15.4; Platelets 217; Potassium 4.5; Sodium 146   Lipid Panel    Component Value Date/Time   CHOL 212 (H) 03/10/2021 1500   TRIG 84 03/10/2021 1500   HDL 75 03/10/2021 1500   CHOLHDL 2.8 03/10/2021 1500   LDLCALC 122 (H) 03/10/2021 1500   Additional studies/ records that were reviewed today include:  OV notes from PCP, EKG    ASSESSMENT:    1. Encounter for cardiac risk counseling   2. Primary hypertension   3. Pure hypercholesterolemia      PLAN:  In order of problems listed above:  Cardiac risk factors for CAD -she has a hx of former tobacco abuse now with O2 dep COPD -she also has a hx of HTN -EKG is nonischemic -recommend coronary Ca score to help to assess future risk of cardiovascular disease -I will get an ETT to assess for ischemia -Shared Decision Making/Informed Consent The risks [chest pain, shortness of breath, cardiac arrhythmias, dizziness, blood pressure fluctuations, myocardial infarction, stroke/transient ischemic attack, and life-threatening complications (estimated to be 1 in 10,000)], benefits (risk stratification, diagnosing coronary artery disease, treatment guidance) and alternatives of an exercise tolerance test were discussed in detail with Ms.  Ricard Dillon and she agrees to proceed.  2.  HTN -BP controlled on exam today -continue on Valsartan HCT 160-12.5mg  daily  3.  HLD -LDL goal preferably < 100 -I have personally reviewed and interpreted outside lbs performed by patient's PCP which showed LDL 122, HDL 75 and TAGs 84 in March 2022 -if she is found to have coronary Ca, her LDL goal will be < 70 and will need to adjust statin  Time Spent: 25 minutes total time of encounter, including 20 minutes spent in face-to-face patient care on the date of this  encounter. This time includes coordination of care and counseling regarding above mentioned problem list. Remainder of non-face-to-face time involved reviewing chart documents/testing relevant to the patient encounter and documentation in the medical record. I have independently reviewed documentation from referring provider  Medication Adjustments/Labs and Tests Ordered: Current medicines are reviewed at length with the patient today.  Concerns regarding medicines are outlined above.  Medication changes, Labs and Tests ordered today are listed in the Patient Instructions below.  There are no Patient Instructions on file for this visit.   Signed, Fransico Him, MD  06/02/2021 4:27 PM    [address], [place], [place]  [address] Phone: [phone]; Fax: [phone]

## 2021-06-02 NOTE — Addendum Note (Signed)
Addended by: [name] on: 06/02/2021 04:43 PM   Modules accepted: Orders

## 2021-06-02 NOTE — Patient Instructions (Signed)
Medication Instructions:  Your physician recommends that you continue on your current medications as directed. Please refer to the Current Medication list given to you today.  *If you need a refill on your cardiac medications before your next appointment, please call your pharmacy*   Testing/Procedures: Your physician has requested that you have an exercise tolerance test. For further information please visit [email or web address]. Please also follow instruction sheet, as given.  Your physician has requested that you have a calcium score CT scan.    Follow-Up: At [facility], you and your health needs are our priority.  As part of our continuing mission to provide you with exceptional heart care, we have created designated Provider Care Teams.  These Care Teams include your primary Cardiologist (physician) and Advanced Practice Providers (APPs -  Physician Assistants and Nurse Practitioners) who all work together to provide you with the care you need, when you need it.  Follow up with Dr. Radford Pax as needed based on results of testing

## 2021-06-03 ENCOUNTER — Telehealth: Payer: Self-pay | Admitting: Cardiology

## 2021-06-03 NOTE — Telephone Encounter (Signed)
Patient cancel the CT Score - she done have the finances to pay for the test.   Order has been cancel.

## 2021-06-23 ENCOUNTER — Telehealth: Payer: Self-pay | Admitting: Cardiology

## 2021-06-23 DIAGNOSIS — R0609 Other forms of dyspnea: Secondary | ICD-10-CM

## 2021-06-23 NOTE — Telephone Encounter (Signed)
Coronary CT ordered per Dr. Radford Pax. Cancelled ETT for 7/12 and cancelled the order. Called and advised patient that the order for the CT has been placed and that she will receive a call from our scheduler once the test has been pre-certified. I advised that at that point she can find out from her insurance company what her financial responsibility is, if any. She verbalized understanding and agreement with the plan. She is aware that I have cancelled her ETT which was scheduled on 7/12. She thanked me for the call.

## 2021-06-23 NOTE — Telephone Encounter (Signed)
Returned call to patient who states she cannot afford the $100 out-of-pocket expense for the calcium score. She also states that she will not be able to complete the ETT due to a history of COPD and would like to know what additional test Dr. Radford Pax can order that will be covered by insurance. I advised that I will forward message to Dr. Radford Pax for review and that once she advises the type of test she recommends, that patient may have to check on out-of-pocket expense as the cost of these tests (except the calcium score) vary based on the patient's insurance and deductibles etc. Patient verbalized understanding and thanked me for the call.

## 2021-06-23 NOTE — Telephone Encounter (Signed)
Patient called in to see if there was another test that she can do instead of the one that is schedule for the 12th. Patient feels that you wont get accurate test from her being she get out of breath east. Please advise

## 2021-06-29 ENCOUNTER — Encounter (HOSPITAL_COMMUNITY): Payer: Self-pay

## 2021-06-29 ENCOUNTER — Telehealth: Payer: Self-pay | Admitting: Critical Care Medicine

## 2021-06-29 ENCOUNTER — Other Ambulatory Visit: Payer: Self-pay

## 2021-06-29 ENCOUNTER — Other Ambulatory Visit: Payer: Self-pay | Admitting: Critical Care Medicine

## 2021-06-29 ENCOUNTER — Other Ambulatory Visit (HOSPITAL_COMMUNITY): Payer: Self-pay | Admitting: Emergency Medicine

## 2021-06-29 DIAGNOSIS — R0609 Other forms of dyspnea: Secondary | ICD-10-CM

## 2021-06-29 DIAGNOSIS — Z7189 Other specified counseling: Secondary | ICD-10-CM

## 2021-06-29 DIAGNOSIS — R0600 Dyspnea, unspecified: Secondary | ICD-10-CM

## 2021-06-29 MED ORDER — METOPROLOL TARTRATE 25 MG PO TABS: 25.0000 mg | ORAL_TABLET | Freq: Once | ORAL | 0 refills | Status: DC

## 2021-06-29 MED ORDER — VALSARTAN-HYDROCHLOROTHIAZIDE 160-12.5 MG PO TABS
1.0000 | ORAL_TABLET | Freq: Every day | ORAL | 0 refills | Status: DC
  Filled 2021-06-29 – 2021-07-20 (×2): qty 90, 90d supply, fill #0

## 2021-06-29 MED ORDER — IVABRADINE HCL 5 MG PO TABS: 7.5000 mg | ORAL_TABLET | Freq: Once | ORAL | 0 refills | Status: AC

## 2021-06-29 MED ORDER — METOPROLOL TARTRATE 25 MG PO TABS
25.0000 mg | ORAL_TABLET | Freq: Once | ORAL | 0 refills | Status: DC
Start: 2021-06-29 — End: 2021-06-29
  Filled 2021-06-29: qty 1, 1d supply, fill #0

## 2021-06-29 MED ORDER — IVABRADINE HCL 5 MG PO TABS
7.5000 mg | ORAL_TABLET | Freq: Once | ORAL | 0 refills | Status: DC
Start: 2021-06-29 — End: 2021-06-29
  Filled 2021-06-29: qty 2, 1d supply, fill #0

## 2021-06-29 MED FILL — Carbamazepine Tab 200 MG: ORAL | 90 days supply | Qty: 180 | Fill #0 | Status: AC

## 2021-06-29 MED FILL — Ipratropium-Albuterol Nebu Soln 0.5-2.5(3) MG/3ML: RESPIRATORY_TRACT | 30 days supply | Qty: 360 | Fill #1 | Status: CN

## 2021-06-29 MED FILL — Fluticasone-Umeclidinium-Vilanterol AEPB 100-62.5-25 MCG/ACT: RESPIRATORY_TRACT | 90 days supply | Qty: 180 | Fill #0 | Status: AC

## 2021-06-29 MED FILL — Atorvastatin Calcium Tab 10 MG (Base Equivalent): ORAL | 90 days supply | Qty: 90 | Fill #1 | Status: AC

## 2021-06-29 NOTE — Progress Notes (Signed)
Reaching out to patient to offer assistance regarding upcoming cardiac imaging study; pt verbalizes understanding of appt date/time, parking situation and where to check in, pre-test NPO status and medications ordered, and verified current allergies; name and call back number provided for further questions should they arise Madison Bond RN Navigator Cardiac Imaging [place] [facility] [phone] office [phone] cell  Prescribed 7.5mg  ivabradine + 25mg  metoprolol tartrate one time dose to patients preferred pharmacy Requested patient come for labs tomorrow at [address] office lab. Will send detailed instructions to patients mychart Madison Phillips

## 2021-06-29 NOTE — Telephone Encounter (Signed)
Madison Phillips calling calling from [employer] is calling about Prior Authorization for Tegretol 200mg  tablets. [place]- [id number] Reference Number [id number]

## 2021-06-29 NOTE — Telephone Encounter (Signed)
Medication has changed to generic.

## 2021-06-30 ENCOUNTER — Telehealth (HOSPITAL_COMMUNITY): Payer: Self-pay | Admitting: Emergency Medicine

## 2021-06-30 ENCOUNTER — Other Ambulatory Visit: Payer: Self-pay

## 2021-06-30 ENCOUNTER — Other Ambulatory Visit: Payer: Medicare Other | Admitting: *Deleted

## 2021-06-30 DIAGNOSIS — R0609 Other forms of dyspnea: Secondary | ICD-10-CM

## 2021-06-30 DIAGNOSIS — R0600 Dyspnea, unspecified: Secondary | ICD-10-CM

## 2021-06-30 LAB — BASIC METABOLIC PANEL
BUN/Creatinine Ratio: 21 (ref 9–23)
BUN: 17 mg/dL (ref 6–24)
CO2: 23 mmol/L (ref 20–29)
Calcium: 9.3 mg/dL (ref 8.7–10.2)
Chloride: 101 mmol/L (ref 96–106)
Creatinine, Ser: 0.81 mg/dL (ref 0.57–1.00)
Glucose: 110 mg/dL — ABNORMAL HIGH (ref 65–99)
Potassium: 3.5 mmol/L (ref 3.5–5.2)
Sodium: 141 mmol/L (ref 134–144)
eGFR: 85 mL/min/{1.73_m2} (ref >59)

## 2021-06-30 NOTE — Telephone Encounter (Signed)
Pt reports shes having issues getting medications for [facility] appt tomorrow. States shes in the middle of some 'business' and that she cannot talk right now. Also mentioned that she got another phone call earlier from the pre-service center.   I encouraged her to read the instructions sent to her mychart account or to call me back with any questions prior to her appt tomorrow. I offered to move her appt if she doesn't have time tomorrow and she wants to keep her appt to 'get it over with' despite the barriers she is facing.  Pt has my number for call back Marchia Bond RN Navigator Cardiac Imaging [facility] [phone] Office  [phone] Cell

## 2021-06-30 NOTE — Telephone Encounter (Signed)
Reaching out to patient to offer assistance regarding upcoming cardiac imaging study; pt verbalizes understanding of appt date/time, parking situation and where to check in, pre-test NPO status and medications ordered, and verified current allergies; name and call back number provided for further questions should they arise Madison Bond RN Navigator Cardiac Imaging [facility] [phone] office [phone] cell  25mg  metoprolol + 7.5mg  ivabradine 2 hr prior to scan Pt encouraged not to wear one-piece outfit or underwire to appt

## 2021-07-01 ENCOUNTER — Encounter: Payer: Self-pay | Admitting: Cardiology

## 2021-07-01 ENCOUNTER — Ambulatory Visit (HOSPITAL_COMMUNITY)
Admission: RE | Admit: 2021-07-01 | Discharge: 2021-07-01 | Disposition: A | Discharge status: Home / Self Care | Payer: Medicare Other | Source: Ambulatory Visit | Attending: Cardiology | Admitting: Cardiology

## 2021-07-01 ENCOUNTER — Encounter (HOSPITAL_COMMUNITY): Payer: Self-pay

## 2021-07-01 ENCOUNTER — Telehealth: Payer: Self-pay

## 2021-07-01 DIAGNOSIS — R0600 Dyspnea, unspecified: Secondary | ICD-10-CM | POA: Diagnosis not present

## 2021-07-01 DIAGNOSIS — Q211 Atrial septal defect: Secondary | ICD-10-CM

## 2021-07-01 DIAGNOSIS — R0609 Other forms of dyspnea: Secondary | ICD-10-CM

## 2021-07-01 DIAGNOSIS — I10 Essential (primary) hypertension: Secondary | ICD-10-CM

## 2021-07-01 DIAGNOSIS — Q2112 Patent foramen ovale: Secondary | ICD-10-CM

## 2021-07-01 DIAGNOSIS — I517 Cardiomegaly: Secondary | ICD-10-CM | POA: Diagnosis not present

## 2021-07-01 DIAGNOSIS — Z7189 Other specified counseling: Secondary | ICD-10-CM

## 2021-07-01 DIAGNOSIS — E7800 Pure hypercholesterolemia, unspecified: Secondary | ICD-10-CM

## 2021-07-01 HISTORY — DX: Patent foramen ovale: Q21.12

## 2021-07-01 HISTORY — DX: Atrial septal defect: Q21.1

## 2021-07-01 MED ORDER — IOHEXOL 350 MG/ML SOLN
95.0000 mL | Freq: Once | INTRAVENOUS | Status: AC | PRN
  Administered 2021-07-01: 95 mL via INTRAVENOUS

## 2021-07-01 MED ORDER — NITROGLYCERIN 0.4 MG SL SUBL
0.8000 mg | SUBLINGUAL_TABLET | Freq: Once | SUBLINGUAL | Status: AC
  Administered 2021-07-01: 0.8 mg via SUBLINGUAL

## 2021-07-01 MED ORDER — POTASSIUM CHLORIDE CRYS ER 20 MEQ PO TBCR: 20.0000 meq | EXTENDED_RELEASE_TABLET | Freq: Every day | ORAL | 3 refills | Status: DC

## 2021-07-01 MED ORDER — NITROGLYCERIN 0.4 MG SL SUBL
SUBLINGUAL_TABLET | SUBLINGUAL | Status: AC
  Filled 2021-07-01: qty 2

## 2021-07-01 NOTE — Progress Notes (Signed)
Patient tolerated CT well. Vital signs stable encourage to drink water throughout day.Reasons explained and verbalized understanding. Ambulated steady gait.   

## 2021-07-01 NOTE — Telephone Encounter (Signed)
The patient has been notified of the result and verbalized understanding.  All questions (if any) were answered. [name], RN 07/01/2021 3:40 PM  Echocardiogram has been ordered.

## 2021-07-01 NOTE — Telephone Encounter (Signed)
The patient has been notified of the result and verbalized understanding.  All questions (if any) were answered. [name], RN 07/01/2021 8:25 AM  Rx has been sent in. Lab work has been scheduled.

## 2021-07-01 NOTE — Telephone Encounter (Signed)
-----   Message from Sueanne Margarita, MD sent at 07/01/2021  2:57 PM EDT ----- Normal coronary arteries with no coronary Ca - possible PFO with RV and RA enlargement..please order 2D echo with bubble study

## 2021-07-01 NOTE — Telephone Encounter (Signed)
-----   Message from Sueanne Margarita, MD sent at 07/01/2021  7:20 AM EDT ----- K+ low normal at 3.5 - add Kdur 74meq daily and repeat BMET in 1 week

## 2021-07-02 ENCOUNTER — Telehealth: Payer: Self-pay | Admitting: Critical Care Medicine

## 2021-07-02 NOTE — Telephone Encounter (Signed)
Copied from [place] [id number]. Topic: General - Other >> Jun 29, 2021  1:52 PM Tessa Lerner A wrote: Reason for CRM: Jake with [employer] has called regarding a prior auth for ipratropium-albuterol (DUONEB) 0.5-2.5 (3) MG/3ML SOLN  Reference number [id number]  Please contact to further advise   Patient came by requesting her Duoneb. She stated it was denied by [facility]. Please advise.

## 2021-07-03 ENCOUNTER — Other Ambulatory Visit: Payer: Medicaid Other

## 2021-07-06 ENCOUNTER — Other Ambulatory Visit: Payer: Self-pay

## 2021-07-06 ENCOUNTER — Other Ambulatory Visit: Payer: Medicare Other | Admitting: *Deleted

## 2021-07-06 DIAGNOSIS — R0609 Other forms of dyspnea: Secondary | ICD-10-CM

## 2021-07-06 DIAGNOSIS — R0600 Dyspnea, unspecified: Secondary | ICD-10-CM

## 2021-07-06 LAB — BASIC METABOLIC PANEL
BUN/Creatinine Ratio: 24 — ABNORMAL HIGH (ref 9–23)
BUN: 19 mg/dL (ref 6–24)
CO2: 26 mmol/L (ref 20–29)
Calcium: 9.7 mg/dL (ref 8.7–10.2)
Chloride: 98 mmol/L (ref 96–106)
Creatinine, Ser: 0.8 mg/dL (ref 0.57–1.00)
Glucose: 97 mg/dL (ref 65–99)
Potassium: 3.9 mmol/L (ref 3.5–5.2)
Sodium: 140 mmol/L (ref 134–144)
eGFR: 86 mL/min/{1.73_m2} (ref >59)

## 2021-07-07 ENCOUNTER — Other Ambulatory Visit: Payer: Self-pay

## 2021-07-08 ENCOUNTER — Other Ambulatory Visit: Payer: Self-pay

## 2021-07-08 ENCOUNTER — Other Ambulatory Visit: Payer: Self-pay | Admitting: Pharmacist

## 2021-07-08 DIAGNOSIS — J449 Chronic obstructive pulmonary disease, unspecified: Secondary | ICD-10-CM

## 2021-07-08 MED ORDER — IPRATROPIUM-ALBUTEROL 0.5-2.5 (3) MG/3ML IN SOLN: 3.0000 mL | Freq: Four times a day (QID) | RESPIRATORY_TRACT | 2 refills | Status: DC | PRN

## 2021-07-08 MED FILL — Ipratropium-Albuterol Nebu Soln 0.5-2.5(3) MG/3ML: RESPIRATORY_TRACT | 30 days supply | Qty: 360 | Fill #1 | Status: CN

## 2021-07-13 ENCOUNTER — Other Ambulatory Visit: Payer: Medicare Other

## 2021-07-15 ENCOUNTER — Other Ambulatory Visit: Payer: Self-pay

## 2021-07-15 ENCOUNTER — Ambulatory Visit (HOSPITAL_COMMUNITY): Payer: Medicare Other | Attending: Internal Medicine

## 2021-07-15 DIAGNOSIS — I10 Essential (primary) hypertension: Secondary | ICD-10-CM | POA: Diagnosis present

## 2021-07-15 DIAGNOSIS — R0609 Other forms of dyspnea: Secondary | ICD-10-CM

## 2021-07-15 DIAGNOSIS — Q2112 Patent foramen ovale: Secondary | ICD-10-CM

## 2021-07-15 DIAGNOSIS — R0600 Dyspnea, unspecified: Secondary | ICD-10-CM

## 2021-07-15 DIAGNOSIS — Q211 Atrial septal defect: Secondary | ICD-10-CM | POA: Insufficient documentation

## 2021-07-15 LAB — ECHOCARDIOGRAM COMPLETE BUBBLE STUDY
Area-P 1/2: 2.22 cm2
S' Lateral: 3 cm

## 2021-07-15 NOTE — Progress Notes (Signed)
Post bubble study patient experienced dizziness and abnormal eye motion that is gradually resolving. Spoke with DOD, Dr. Rayann Heman. Discharge per Dr. Rayann Heman when patient is back at baseline.  CRITICAL VALUE STICKER  DATE & TIME NOTIFIED:  07/15/21 2:31 pm MESSENGER (representative from lab): Ok to discharge patient-when back at baseline  MD NOTIFIED: Dr. Rayann Heman  TIME OF NOTIFICATION: 2:31 pm  RESPONSE:  Discharge.

## 2021-07-20 ENCOUNTER — Other Ambulatory Visit: Payer: Self-pay

## 2021-07-20 ENCOUNTER — Telehealth: Payer: Self-pay

## 2021-07-20 NOTE — Telephone Encounter (Signed)
-----   Message from Sueanne Margarita, MD sent at 07/19/2021  5:57 PM EDT ----- Regarding: RE: Very postive bubble study Please get patient a Neuro consult for further evaluation of seizure like activity after agitated saline contrast injection for PFO ----- Message ----- From: Cresenciano Lick Sent: 07/15/2021   2:41 PM EDT To: Sueanne Margarita, MD Subject: Very postive bubble study                      Hey Dr. Radford Pax,  This is just an FYI. This patient's bubble study was positive, very positive. We didn't do a Valsalva due to patient's symptoms. Immediately post 2nd injection the patient began experiencing dizziness and abnormal eye motion. The symptoms resolved during the echo. We notified, the DOD, Dr. Rayann Heman. He stated to discharge the patient once she was back to baseline.   Lorriane Shire

## 2021-07-20 NOTE — Telephone Encounter (Signed)
Left message for patient to call back  

## 2021-07-20 NOTE — Telephone Encounter (Signed)
Pt returning phone call... please advise  

## 2021-07-22 ENCOUNTER — Other Ambulatory Visit: Payer: Self-pay

## 2021-07-22 ENCOUNTER — Encounter (HOSPITAL_COMMUNITY): Payer: Self-pay

## 2021-07-22 ENCOUNTER — Ambulatory Visit (HOSPITAL_COMMUNITY)
Admission: EM | Admit: 2021-07-22 | Discharge: 2021-07-22 | Disposition: A | Discharge status: Home / Self Care | Payer: Medicare Other | Attending: Student | Admitting: Student

## 2021-07-22 DIAGNOSIS — S39012A Strain of muscle, fascia and tendon of lower back, initial encounter: Secondary | ICD-10-CM

## 2021-07-22 MED ORDER — TIZANIDINE HCL 2 MG PO CAPS: 2.0000 mg | ORAL_CAPSULE | Freq: Three times a day (TID) | ORAL | 0 refills | Status: DC

## 2021-07-22 NOTE — ED Triage Notes (Signed)
Pt c/o pain in left lower back, denies abd pain and other symptoms.  Started: 3 days ago Interventions: tylenol, ice- non helpful

## 2021-07-22 NOTE — ED Provider Notes (Signed)
[place]    CSN: [id number] Arrival date & time: 07/22/21  0801      History   Chief Complaint Chief Complaint  Patient presents with   Back Pain    HPI Madison Phillips is a 55 y.o. female presenting with left-sided lumbar strain following heavy lifting.  Medical history COPD, GERD.  States that she did some heavy lifting 3 days ago, following this developed left-sided lower back pain, worse with moving and twisting her spine.  She has been taking her sister's Tylenol with codeine which is providing minimal relief.  Also using ice with minimal relief.  She is followed by an orthopedist for hip issues, but has not seen them for her back pain before.  Denies new urinary symptoms. Denies pain shooting down legs, denies numbness in arms/legs, denies weakness in arms/legs, denies saddle anesthesia, denies bowel/bladder incontinence, denies urinary retention, denies constipation.   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   Left ear hearing loss 12/05/2018   PFO (patent foramen ovale)    noted on coronary CTA 06/2021   Pneumonia due to COVID-19 virus 05/24/2019    Patient Active Problem List   Diagnosis Date Noted   PFO (patent foramen ovale) 07/01/2021   Centrilobular emphysema ([place]) 07/17/2019   Postmenopausal bleeding 03/09/2019   Chronic respiratory failure with hypoxia ([place]) 01/23/2019   Hypertension 12/25/2018   GERD (gastroesophageal reflux disease) 12/25/2018   Left ear hearing loss 12/05/2018   History of acoustic neuroma 12/05/2018   Varicose veins of left lower extremity 12/05/2018   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 ([place]) 12/05/2018   Hyperlipidemia 03/04/2016   Polyp of colon 11/03/2015    Past Surgical History:  Procedure  Laterality Date   cyber knife     ectopic pregnanacy      OB History   No obstetric history on file.      Home Medications    Prior to Admission medications   Medication Sig Start Date End Date Taking? Authorizing Provider  tizanidine (ZANAFLEX) 2 MG capsule Take 1 capsule (2 mg total) by mouth 3 (three) times daily. 07/22/21  Yes Hazel Sams, PA-C  albuterol So [facility] HFA) 108 (90 Base) MCG/ACT inhaler Inhale 2 puffs into the lungs every 6 (six) hours as needed for wheezing or shortness of breath. 07/07/20   Elsie Stain, MD  atorvastatin (LIPITOR) 10 MG tablet TAKE 1 TABLET (10 MG TOTAL) BY MOUTH DAILY. 03/12/21 03/12/22  Elsie Stain, MD  carbamazepine (TEGRETOL) 200 MG tablet TAKE 1 TABLET BY MOUTH TWICE DAILY 03/10/21 03/10/22  Elsie Stain, MD  Fluticasone-Umeclidin-Vilant 100-62.5-25 MCG/INH AEPB INHALE 1 PUFF INTO THE LUNGS DAILY. 03/10/21 03/10/22  Elsie Stain, MD  Fluticasone-Umeclidin-Vilant 100-62.5-25 MCG/INH AEPB TAKE 1 PUFF BY MOUTH DAILY. 11/04/20 11/04/21  Elsie Stain, MD  ipratropium-albuterol (DUONEB) 0.5-2.5 (3) MG/3ML SOLN Take 3 mLs by nebulization every 6 (six) hours as needed. J44.9 07/08/21 07/08/22  Elsie Stain, MD  metoprolol tartrate (LOPRESSOR) 25 MG tablet Take 1 tablet (25 mg total) by mouth once for 1 dose. 2 hr prior to CT 06/29/21 06/29/21  Sueanne Margarita, MD  OXYGEN Inhale 2 L into the lungs at bedtime.    [provider]  potassium chloride SA (KLOR-CON) 20 MEQ  tablet Take 1 tablet (20 mEq total) by mouth daily. 07/01/21   Sueanne Margarita, MD  triamcinolone (KENALOG) 0.1 % Apply topically. 11/03/20   [provider]  valsartan-hydrochlorothiazide (DIOVAN-HCT) 160-12.5 MG tablet TAKE 1 TABLET BY MOUTH DAILY. 06/29/21 06/29/22  Elsie Stain, MD  vitamin C (VITAMIN C) 500 MG tablet Take 1 tablet (500 mg total) by mouth daily. Please take for 10 days 05/29/19   Elgergawy, Silver Huguenin, MD  zinc sulfate 220 (50 Zn) MG  capsule Take 1 capsule (220 mg total) by mouth daily. Please take for 10 days 05/29/19   Elgergawy, Silver Huguenin, MD    Family History Family History  Problem Relation Age of Onset   Hypothyroidism Mother    Diabetes Mellitus II Maternal Aunt    Diabetes Maternal Aunt    Cancer Father    Breast cancer Neg Hx     Social History Social History   Tobacco Use   Smoking status: Former   Smokeless tobacco: Never  Scientific laboratory technician Use: Never used  Substance Use Topics   Alcohol use: Yes    Alcohol/week: 2.0 - 3.0 standard drinks    Types: 2 - 3 Cans of beer per week    Comment: a night   Drug use: Not Currently     Allergies   Patient has no known allergies.   Review of Systems Review of Systems  Constitutional:  Negative for chills, fever and unexpected weight change.  Respiratory:  Negative for chest tightness and shortness of breath.   Cardiovascular:  Negative for chest pain and palpitations.  Gastrointestinal:  Negative for abdominal pain, diarrhea, nausea and vomiting.  Genitourinary:  Negative for decreased urine volume, difficulty urinating and frequency.  Musculoskeletal:  Positive for back pain. Negative for arthralgias, gait problem, joint swelling, myalgias, neck pain and neck stiffness.  Skin:  Negative for wound.  Neurological:  Negative for dizziness, tremors, seizures, syncope, facial asymmetry, speech difficulty, weakness, light-headedness, numbness and headaches.  All other systems reviewed and are negative.   Physical Exam Triage Vital Signs ED Triage Vitals  Enc Vitals Group     BP 07/22/21 0823 114/81     Pulse Rate 07/22/21 0823 93     Resp 07/22/21 0823 20     Temp 07/22/21 0823 97.7 F (36.5 C)     Temp Source 07/22/21 0823 Oral     SpO2 07/22/21 0823 98 %     Weight --      Height --      Head Circumference --      Peak Flow --      Pain Score 07/22/21 0821 8     Pain Loc --      Pain Edu? --      Excl. in [place]? --    No data  found.  Updated Vital Signs BP 114/81 (BP Location: Left Arm)   Pulse 93   Temp 97.7 F (36.5 C) (Oral)   Resp 20   SpO2 98%   Visual Acuity Right Eye Distance:   Left Eye Distance:   Bilateral Distance:    Right Eye Near:   Left Eye Near:    Bilateral Near:     Physical Exam Vitals reviewed.  Constitutional:      General: She is not in acute distress.    Appearance: Normal appearance. She is not ill-appearing.  HENT:     Head: Normocephalic and atraumatic.  Cardiovascular:     Rate  and Rhythm: Normal rate and regular rhythm.     Heart sounds: Normal heart sounds.  Pulmonary:     Effort: Pulmonary effort is normal.     Breath sounds: Normal breath sounds and air entry.  Abdominal:     Tenderness: There is no abdominal tenderness. There is no right CVA tenderness, left CVA tenderness, guarding or rebound.  Musculoskeletal:     Cervical back: Normal range of motion. No swelling, deformity, signs of trauma, rigidity, spasms, tenderness, bony tenderness or crepitus. No pain with movement.     Thoracic back: No swelling, deformity, signs of trauma, spasms, tenderness or bony tenderness. Normal range of motion. No scoliosis.     Lumbar back: No swelling, deformity, signs of trauma, spasms, tenderness or bony tenderness. Normal range of motion. Negative right straight leg raise test and negative left straight leg raise test. No scoliosis.     Comments: Left-sided lumbar paraspinous muscle tenderness.  No pain with palpation. pain elicited with flexion and extension lumbar spine, and with twisting to the left and right.  Strength and sensation intact upper and lower extremities, no saddle anesthesia.  Gait intact. No midline spinous tenderness, deformity, stepoff.  Absolutely no other injury, deformity, tenderness, ecchymosis, abrasion.  Neurological:     General: No focal deficit present.     Mental Status: She is alert.     Cranial Nerves: No cranial nerve deficit.   Psychiatric:        Mood and Affect: Mood normal.        Behavior: Behavior normal.        Thought Content: Thought content normal.        Judgment: Judgment normal.     UC Treatments / Results  Labs (all labs ordered are listed, but only abnormal results are displayed) Labs Reviewed - No data to display  EKG   Radiology No results found.  Procedures Procedures (including critical care time)  Medications Ordered in UC Medications - No data to display  Initial Impression / Assessment and Plan / UC Course  I have reviewed the triage vital signs and the nursing notes.  Pertinent labs & imaging results that were available during my care of the patient were reviewed by me and considered in my medical decision making (see chart for details).     This patient is a very pleasant 55 y.o. year old female presenting with lumbar strain. No red flag symptoms. Zanaflex, tylenol/ibuprofen.  Advised against using sister's Tylenol with codeine. F/u with ortho if symptoms worsen/persist. ED return precautions discussed. Patient verbalizes understanding and agreement.    Final Clinical Impressions(s) / UC Diagnoses   Final diagnoses:  Strain of lumbar region, initial encounter     Discharge Instructions      -Start the muscle relaxer-Zanaflex (tizanidine), up to 3 times daily for muscle spasms and pain.  This can make you drowsy, so take at bedtime or when you do not need to drive or operate machinery. -For additional relief- Take Tylenol 1000 mg 3 times daily, and ibuprofen 800 mg 3 times daily with food.  You can take these together, or alternate every 3-4 hours. -If symptoms persist in 5-7 days, follow-up with an orthopedist. I recommend [facility] at [address], [place], [place] [address]. You can schedule an appointment by calling [phone]) or online ([email or web address]), but they also have a walk-in clinic M-F 8a-8p and Sat 10a-3p. You could alternatively follow-up  with your own orthopedist.    ED Prescriptions  Medication Sig Dispense Auth. Provider   tizanidine (ZANAFLEX) 2 MG capsule Take 1 capsule (2 mg total) by mouth 3 (three) times daily. 21 capsule Hazel Sams, PA-C      PDMP not reviewed this encounter.   Hazel Sams, PA-C 07/22/21 [phone]

## 2021-07-22 NOTE — Discharge Instructions (Addendum)
-  Start the muscle relaxer-Zanaflex (tizanidine), up to 3 times daily for muscle spasms and pain.  This can make you drowsy, so take at bedtime or when you do not need to drive or operate machinery. -For additional relief- Take Tylenol 1000 mg 3 times daily, and ibuprofen 800 mg 3 times daily with food.  You can take these together, or alternate every 3-4 hours. -If symptoms persist in 5-7 days, follow-up with an orthopedist. I recommend [facility] at [address], [place], [place] [address]. You can schedule an appointment by calling [phone]) or online ([email or web address]), but they also have a walk-in clinic M-F 8a-8p and Sat 10a-3p. You could alternatively follow-up with your own orthopedist.

## 2021-07-23 ENCOUNTER — Other Ambulatory Visit: Payer: Self-pay

## 2021-07-23 ENCOUNTER — Telehealth: Payer: Self-pay | Admitting: Neurology

## 2021-07-23 MED FILL — Fluticasone-Umeclidinium-Vilanterol AEPB 100-62.5-25 MCG/ACT: RESPIRATORY_TRACT | 90 days supply | Qty: 180 | Fill #0 | Status: CN

## 2021-07-23 NOTE — Telephone Encounter (Addendum)
Pt states it is due to the fault of [employer] that put the code in for : Acoustic Neuroma as to why she needs the :carbamazepine (TEGRETOL) 200 MG tablet pt states the code should have been for Facial Spasms, she states because of the wrong code she is being denied this seizure medication that she needs due to having a week remaining.  Pt has asked the urgency be stressed on how very important it is that she gets this medication.  Please call.

## 2021-07-23 NOTE — Telephone Encounter (Signed)
In review of the patient's chart, her last refill of carbamazepine '200mg'$ , one tab BID was sent in by Dr. Asencion Noble. I called [employer] [phone]) to see if there was in issue. [employer] said the patient received a refill for #180 on 06/29/21. I called [facility] [phone]) and spoke to [place]. She is showing a prescription was picked up on 06/29/21 for #180. She also said the patient had additional refills on file. She is going to count her stock to see if they inadvertently provided the patient with only a 30 day supply. She would like for me to call her back. In the meantime, I have left a message asking the patient to return my call.

## 2021-07-23 NOTE — Telephone Encounter (Signed)
I spoke to [place] at the pharmacy of [facility] who confirmed the patient received the correct number of tablets. She also stated the prescription came from Dr. Asencion Noble who is a physician at their clinic.   I called the patient back again who states "I lied about being out of medication next week" because she was concerned about a letter she received from [facility]. She did not want to get in a situation that led to her being without the carbamazepine. The letter indicated the medication would not be covered for her history of acoustic neuroma. She takes it for facial spasms. In my earlier conversation with [facility], they said that no prior authorization would be needed for this medication. I told the patient she may want to contact them just to confirm her coverage. She appreciated the information.

## 2021-08-03 ENCOUNTER — Ambulatory Visit: Payer: Medicaid Other | Admitting: Neurology

## 2021-08-04 ENCOUNTER — Ambulatory Visit: Payer: Medicare Other | Attending: Critical Care Medicine | Admitting: Critical Care Medicine

## 2021-08-04 ENCOUNTER — Other Ambulatory Visit: Payer: Self-pay

## 2021-08-04 ENCOUNTER — Encounter: Payer: Self-pay | Admitting: Critical Care Medicine

## 2021-08-04 ENCOUNTER — Other Ambulatory Visit: Payer: Self-pay | Admitting: Critical Care Medicine

## 2021-08-04 DIAGNOSIS — J449 Chronic obstructive pulmonary disease, unspecified: Secondary | ICD-10-CM | POA: Diagnosis not present

## 2021-08-04 DIAGNOSIS — Z1231 Encounter for screening mammogram for malignant neoplasm of breast: Secondary | ICD-10-CM

## 2021-08-04 DIAGNOSIS — Z86018 Personal history of other benign neoplasm: Secondary | ICD-10-CM

## 2021-08-04 DIAGNOSIS — Q211 Atrial septal defect: Secondary | ICD-10-CM

## 2021-08-04 DIAGNOSIS — I10 Essential (primary) hypertension: Secondary | ICD-10-CM

## 2021-08-04 DIAGNOSIS — Z124 Encounter for screening for malignant neoplasm of cervix: Secondary | ICD-10-CM

## 2021-08-04 DIAGNOSIS — J9611 Chronic respiratory failure with hypoxia: Secondary | ICD-10-CM

## 2021-08-04 DIAGNOSIS — Q2112 Patent foramen ovale: Secondary | ICD-10-CM

## 2021-08-04 DIAGNOSIS — E7800 Pure hypercholesterolemia, unspecified: Secondary | ICD-10-CM

## 2021-08-04 MED ORDER — VALSARTAN-HYDROCHLOROTHIAZIDE 160-12.5 MG PO TABS
1.0000 | ORAL_TABLET | Freq: Every day | ORAL | 2 refills | Status: DC
  Filled 2021-08-04 – 2021-10-26 (×2): qty 90, 90d supply, fill #0

## 2021-08-04 MED ORDER — FLUTICASONE-UMECLIDIN-VILANT 100-62.5-25 MCG/INH IN AEPB
INHALATION_SPRAY | RESPIRATORY_TRACT | 1 refills | Status: DC
  Filled 2021-08-04: qty 180, fill #0

## 2021-08-04 MED ORDER — ATORVASTATIN CALCIUM 10 MG PO TABS
ORAL_TABLET | Freq: Every day | ORAL | 3 refills | Status: DC
  Filled 2021-08-04: qty 90, fill #0
  Filled 2021-10-01: qty 90, 90d supply, fill #0

## 2021-08-04 MED ORDER — ALBUTEROL SULFATE (2.5 MG/3ML) 0.083% IN NEBU
2.5000 mg | INHALATION_SOLUTION | Freq: Four times a day (QID) | RESPIRATORY_TRACT | 1 refills | Status: DC | PRN
  Filled 2021-08-04: qty 150, 13d supply, fill #0

## 2021-08-04 NOTE — Assessment & Plan Note (Signed)
Noted on coronary CT echocardiogram does not show pulmonary hypertension or significant shunt will observe for now patient given recommendations for prophylaxis should she have significant dental procedures

## 2021-08-04 NOTE — Assessment & Plan Note (Signed)
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

## 2021-08-04 NOTE — Assessment & Plan Note (Signed)
Continue atorvastatin 10 mg daily we will follow-up lipid panel at the next visit

## 2021-08-04 NOTE — Progress Notes (Signed)
Established Patient Office Visit  Subjective:  Patient ID: Madison Phillips, female    DOB: 04/29/1966  Age: 55 y.o. MRN: [id number] Virtual Visit via Telephone Note  I connected with [name] on 08/04/21 at  2:00 PM EDT by telephone and verified that I am speaking with the correct person using two identifiers.   Consent:  I discussed the limitations, risks, security and privacy concerns of performing an evaluation and management service by telephone and the availability of in person appointments. I also discussed with the patient that there may be a patient responsible charge related to this service. The patient expressed understanding and agreed to proceed.  Location of patient: Patient is at home Location of provider: I am in my office  Persons participating in the televisit with the patient.   No one else on the call    History of Present Illness:   CC: PCP f/u visit  HPI [name] presents for follow-up primary care visit. Patient was last seen in March for COPD chronic respiratory failure she is currently on Trelegy and nocturnal oxygen treatments.  Her breathing is at baseline.  Her insurance denied the DuoNeb but may pay for albuterol by nebulization. Blood pressure has been good at recent visits.  She is on the valsartan HCT.  She is no longer requiring potassium supplementation.  Patient's been seen by cardiology and screen for cardiac disease she does have a patent foramen ovale but no significant pulmonary hypertension on echo and coronary CT was negative for coronary artery disease  Will need neurology input as I was not able to get her Tegretol refilled she does have about a month supply left Past Medical History:  Diagnosis Date   Acute respiratory failure with hypoxia ([place]) 05/24/2019   COPD (chronic obstructive pulmonary disease) (HCC)    GERD (gastroesophageal reflux disease) 12/25/2018   History of acoustic neuroma 12/05/2018   Left ear  hearing loss 12/05/2018   PFO (patent foramen ovale)    noted on coronary CTA 06/2021   Pneumonia due to COVID-19 virus 05/24/2019    Past Surgical History:  Procedure Laterality Date   cyber knife     ectopic pregnanacy      Family History  Problem Relation Age of Onset   Hypothyroidism Mother    Diabetes Mellitus II Maternal Aunt    Diabetes Maternal Aunt    Cancer Father    Breast cancer Neg Hx     Social History   Socioeconomic History   Marital status: Single    Spouse name: Not on file   Number of children: Not on file   Years of education: Not on file   Highest education level: Not on file  Occupational History   Not on file  Tobacco Use   Smoking status: Former   Smokeless tobacco: Never  Vaping Use   Vaping Use: Never used  Substance and Sexual Activity   Alcohol use: Yes    Alcohol/week: 2.0 - 3.0 standard drinks    Types: 2 - 3 Cans of beer per week    Comment: a night   Drug use: Not Currently   Sexual activity: Yes  Other Topics Concern   Not on file  Social History Narrative   Not on file   Social Determinants of Health   Financial Resource Strain: Not on file  Food Insecurity: Not on file  Transportation Needs: Not on file  Physical Activity: Not on file  Stress: Not on  file  Social Connections: Not on file  Intimate Partner Violence: Not on file    Outpatient Medications Prior to Visit  Medication Sig Dispense Refill   albuterol (PROAIR HFA) 108 (90 Base) MCG/ACT inhaler Inhale 2 puffs into the lungs every 6 (six) hours as needed for wheezing or shortness of breath. 8.5 g 2   carbamazepine (TEGRETOL) 200 MG tablet TAKE 1 TABLET BY MOUTH TWICE DAILY 180 tablet 1   OXYGEN Inhale 2 L into the lungs at bedtime.     triamcinolone (KENALOG) 0.1 % Apply topically.     vitamin C (VITAMIN C) 500 MG tablet Take 1 tablet (500 mg total) by mouth daily. Please take for 10 days     zinc sulfate 220 (50 Zn) MG capsule Take 1 capsule (220 mg total) by  mouth daily. Please take for 10 days     atorvastatin (LIPITOR) 10 MG tablet TAKE 1 TABLET (10 MG TOTAL) BY MOUTH DAILY. 90 tablet 3   Fluticasone-Umeclidin-Vilant 100-62.5-25 MCG/INH AEPB INHALE 1 PUFF INTO THE LUNGS DAILY. 180 each 1   Fluticasone-Umeclidin-Vilant 100-62.5-25 MCG/INH AEPB TAKE 1 PUFF BY MOUTH DAILY. 180 each 1   ipratropium-albuterol (DUONEB) 0.5-2.5 (3) MG/3ML SOLN Take 3 mLs by nebulization every 6 (six) hours as needed. J44.9 360 mL 2   metoprolol tartrate (LOPRESSOR) 25 MG tablet Take 1 tablet (25 mg total) by mouth once for 1 dose. 2 hr prior to CT 1 tablet 0   potassium chloride SA (KLOR-CON) 20 MEQ tablet Take 1 tablet (20 mEq total) by mouth daily. 90 tablet 3   tizanidine (ZANAFLEX) 2 MG capsule Take 1 capsule (2 mg total) by mouth 3 (three) times daily. 21 capsule 0   valsartan-hydrochlorothiazide (DIOVAN-HCT) 160-12.5 MG tablet TAKE 1 TABLET BY MOUTH DAILY. 90 tablet 0   No facility-administered medications prior to visit.    No Known Allergies  ROS Review of Systems    Objective:    Physical Exam No exam this is a phone visit There were no vitals taken for this visit. Wt Readings from Last 3 Encounters:  06/02/21 185 lb 3.2 oz (84 kg)  03/10/21 184 lb 9.6 oz (83.7 kg)  01/27/21 182 lb (82.6 kg)     Health Maintenance Due  Topic Date Due   COVID-19 Vaccine (1) Never done   Pneumococcal Vaccine 1-34 Years old (1 - PCV) Never done   Zoster Vaccines- Shingrix (1 of 2) Never done    There are no preventive care reminders to display for this patient. 06/2021 Echo 1. Evidence of atrial level shunting detected by color flow Doppler. Agitated saline contrast bubble study was positive with shunting observed within 3-6 cardiac cycles suggestive of interatrial shunt. 2. Left ventricular ejection fraction, by estimation, is 60 to 65%. The left ventricle has normal function. The left ventricle has no regional wall motion abnormalities. Left  ventricular diastolic parameters were normal. 3. Right ventricular systolic function is normal. The right ventricular size is normal. There is normal pulmonary artery systolic pressure. The estimated right ventricular systolic pressure is 40.9 mmHg. 4. The mitral valve is normal in structure. No evidence of mitral valve regurgitation. No evidence of mitral stenosis. 5. The aortic valve is tricuspid. Aortic valve regurgitation is not visualized. No aortic stenosis is present. 6. The inferior vena cava is normal in size with greater than 50% respiratory variability, suggesting right atrial pressure of 3 mmHg.   06/2021 Coronary CT  MPRESSION: 1. Coronary calcium score of 0.  2. Normal coronary origin with right dominance.   3. Normal coronary arteries.   4. Dilated pulmonary artery suggestive of pulmonary hypertension. RA/RV dilation noted as well.   5. PFO noted.   RECOMMENDATIONS: 1. No evidence of CAD (0%). Consider non-atherosclerotic causes of chest pain.   2. Would recommend an echocardiogram to evaluate PA pressure.   Lab Results  Component Value Date   TSH 0.537 12/05/2018   Lab Results  Component Value Date   WBC 9.0 03/10/2021   HGB 15.4 03/10/2021   HCT 44.9 03/10/2021   MCV 91 03/10/2021   PLT 217 03/10/2021   Lab Results  Component Value Date   NA 140 07/06/2021   K 3.9 07/06/2021   CO2 26 07/06/2021   GLUCOSE 97 07/06/2021   BUN 19 07/06/2021   CREATININE 0.80 07/06/2021   BILITOT <0.2 03/10/2021   ALKPHOS 113 03/10/2021   AST 16 03/10/2021   ALT 16 03/10/2021   PROT 6.9 03/10/2021   ALBUMIN 4.5 03/10/2021   CALCIUM 9.7 07/06/2021   ANIONGAP 6 05/27/2019   EGFR 86 07/06/2021   Lab Results  Component Value Date   CHOL 212 (H) 03/10/2021   Lab Results  Component Value Date   HDL 75 03/10/2021   Lab Results  Component Value Date   LDLCALC 122 (H) 03/10/2021   Lab Results  Component Value Date   TRIG 84 03/10/2021   Lab Results   Component Value Date   CHOLHDL 2.8 03/10/2021   Lab Results  Component Value Date   HGBA1C 5.4 12/24/2018      Assessment & Plan:   Problem List Items Addressed This Visit       Cardiovascular and Mediastinum   Hypertension    Hypertension well controlled at this time continue valsartan HCT discontinue potassium      Relevant Medications   atorvastatin (LIPITOR) 10 MG tablet   valsartan-hydrochlorothiazide (DIOVAN-HCT) 160-12.5 MG tablet   PFO (patent foramen ovale)    Noted on coronary CT echocardiogram does not show pulmonary hypertension or significant shunt will observe for now patient given recommendations for prophylaxis should she have significant dental procedures      Relevant Medications   atorvastatin (LIPITOR) 10 MG tablet   valsartan-hydrochlorothiazide (DIOVAN-HCT) 160-12.5 MG tablet     Respiratory   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) (Chronic)    Gold stage D COPD stable at this time continue Trelegy we will prescribe albuterol by nebulization if needed Continue nocturnal oxygen therapy      Relevant Medications   Fluticasone-Umeclidin-Vilant 100-62.5-25 MCG/INH AEPB   albuterol (PROVENTIL) (2.5 MG/3ML) 0.083% nebulizer solution   Chronic respiratory failure with hypoxia (HCC)    Continue nocturnal oxygen therapy 2 L        Other   History of acoustic neuroma    History of acoustic neuroma with associated facial spasms for which she is on Tegretol have asked neurology to assist in referral as [employer] has denied my attempts      Hyperlipidemia    Continue atorvastatin 10 mg daily we will follow-up lipid panel at the next visit      Relevant Medications   atorvastatin (LIPITOR) 10 MG tablet   valsartan-hydrochlorothiazide (DIOVAN-HCT) 160-12.5 MG tablet   Other Visit Diagnoses     Cervical cancer screening    -  Primary   Relevant Orders   Ambulatory referral to Gynecology       Meds ordered this encounter  Medications   Fluticasone-Umeclidin-Vilant 100-62.5-25 MCG/INH AEPB    Sig: TAKE 1 PUFF BY MOUTH DAILY.    Dispense:  180 each    Refill:  1   atorvastatin (LIPITOR) 10 MG tablet    Sig: TAKE 1 TABLET (10 MG TOTAL) BY MOUTH DAILY.    Dispense:  90 tablet    Refill:  3   valsartan-hydrochlorothiazide (DIOVAN-HCT) 160-12.5 MG tablet    Sig: TAKE 1 TABLET BY MOUTH DAILY.    Dispense:  90 tablet    Refill:  2   albuterol (PROVENTIL) (2.5 MG/3ML) 0.083% nebulizer solution    Sig: Take 3 mLs (2.5 mg total) by nebulization every 6 (six) hours as needed for wheezing or shortness of breath.    Dispense:  150 mL    Refill:  1   Referral made to gynecology for cervical cancer screening and woman well exam per patient request  Follow-up: Return in about 2 months (around 10/04/2021).   Follow Up Instructions: Patient knows a follow-up exam will occur in October and referral to gynecology will be made   I discussed the assessment and treatment plan with the patient. The patient was provided an opportunity to ask questions and all were answered. The patient agreed with the plan and demonstrated an understanding of the instructions.   The patient was advised to call back or seek an in-person evaluation if the symptoms worsen or if the condition fails to improve as anticipated.  I provided 24 minutes of non-face-to-face time during this encounter  including  median intraservice time , review of notes, labs, imaging, medications  and explaining diagnosis and management to the patient .    Asencion Noble, MD

## 2021-08-04 NOTE — Assessment & Plan Note (Signed)
Gold stage D COPD stable at this time continue Trelegy we will prescribe albuterol by nebulization if needed Continue nocturnal oxygen therapy

## 2021-08-04 NOTE — Assessment & Plan Note (Signed)
Continue nocturnal oxygen therapy 2 L

## 2021-08-04 NOTE — Assessment & Plan Note (Signed)
Hypertension well controlled at this time continue valsartan HCT discontinue potassium

## 2021-08-05 ENCOUNTER — Other Ambulatory Visit: Payer: Self-pay

## 2021-08-05 ENCOUNTER — Other Ambulatory Visit: Payer: Self-pay | Admitting: Neurology

## 2021-08-05 MED ORDER — CARBAMAZEPINE 200 MG PO TABS
ORAL_TABLET | Freq: Two times a day (BID) | ORAL | 1 refills | Status: DC
  Filled 2021-08-05: qty 180, fill #0

## 2021-08-10 ENCOUNTER — Telehealth: Payer: Self-pay | Admitting: Neurology

## 2021-08-10 ENCOUNTER — Other Ambulatory Visit: Payer: Self-pay

## 2021-08-10 ENCOUNTER — Ambulatory Visit (INDEPENDENT_AMBULATORY_CARE_PROVIDER_SITE_OTHER): Payer: Medicare Other | Admitting: Neurology

## 2021-08-10 ENCOUNTER — Encounter: Payer: Self-pay | Admitting: Neurology

## 2021-08-10 VITALS — BP 104/70 | HR 80 | Ht 70.0 in | Wt 182.0 lb

## 2021-08-10 DIAGNOSIS — Z86018 Personal history of other benign neoplasm: Secondary | ICD-10-CM | POA: Diagnosis not present

## 2021-08-10 MED ORDER — ALPRAZOLAM 0.5 MG PO TABS: ORAL_TABLET | ORAL | 0 refills | Status: AC

## 2021-08-10 MED ORDER — ALPRAZOLAM 0.5 MG PO TABS
ORAL_TABLET | ORAL | 0 refills | Status: DC
  Filled 2021-08-10: qty 3, fill #0

## 2021-08-10 NOTE — Telephone Encounter (Signed)
Medicare/medicaid order sent to [facility]. [employer] they will reach out to the patient to schedule.

## 2021-08-10 NOTE — Progress Notes (Signed)
Reason for visit: Acoustic neuroma  Madison Phillips is an 55 y.o. female  History of present illness:  Madison Phillips is a 55 year old right-handed black female with a history of an acoustic neuroma on the left diagnosed in 2011.  The patient had a gamma knife procedure done.  She has moved to this area from [place].  MRI of the brain was done over 2 years ago.  She has not noted any new issues, she did have headaches previously on her last visit, but she stopped drinking wine, and this helped her headache.  The patient has decreased hearing in the left ear, she wonders if a hearing aid may help her.  The patient otherwise is felt quite well.  She no longer has any hemifacial spasm on carbamazepine.  She has had some recent blood work done.  Past Medical History:  Diagnosis Date   Acute respiratory failure with hypoxia ([place]) 05/24/2019   COPD (chronic obstructive pulmonary disease) (HCC)    GERD (gastroesophageal reflux disease) 12/25/2018   History of acoustic neuroma 12/05/2018   Left ear hearing loss 12/05/2018   PFO (patent foramen ovale)    noted on coronary CTA 06/2021   Pneumonia due to COVID-19 virus 05/24/2019    Past Surgical History:  Procedure Laterality Date   cyber knife     ectopic pregnanacy      Family History  Problem Relation Age of Onset   Hypothyroidism Mother    Diabetes Mellitus II Maternal Aunt    Diabetes Maternal Aunt    Cancer Father    Breast cancer Neg Hx     Social history:  reports that she has quit smoking. She has never used smokeless tobacco. She reports current alcohol use of about 3.0 - 4.0 standard drinks per week. She reports that she does not currently use drugs.   No Known Allergies  Medications:  Prior to Admission medications   Medication Sig Start Date End Date Taking? Authorizing Provider  albuterol (PROAIR HFA) 108 (90 Base) MCG/ACT inhaler Inhale 2 puffs into the lungs every 6 (six) hours as needed for wheezing or shortness  of breath. 07/07/20  Yes Elsie Stain, MD  albuterol (PROVENTIL) (2.5 MG/3ML) 0.083% nebulizer solution Take 3 mLs (2.5 mg total) by nebulization every 6 (six) hours as needed for wheezing or shortness of breath. 08/04/21  Yes Elsie Stain, MD  atorvastatin (LIPITOR) 10 MG tablet TAKE 1 TABLET (10 MG TOTAL) BY MOUTH DAILY. 08/04/21 08/04/22 Yes Elsie Stain, MD  carbamazepine (TEGRETOL) 200 MG tablet TAKE 1 TABLET BY MOUTH TWICE DAILY 08/05/21 08/05/22 Yes Kathrynn Ducking, MD  Fluticasone-Umeclidin-Vilant 100-62.5-25 MCG/INH AEPB TAKE 1 PUFF BY MOUTH DAILY. 08/04/21 08/04/22 Yes Elsie Stain, MD  OXYGEN Inhale 2 L into the lungs at bedtime.   Yes [provider]  valsartan-hydrochlorothiazide (DIOVAN-HCT) 160-12.5 MG tablet TAKE 1 TABLET BY MOUTH DAILY. 08/04/21 08/04/22 Yes Elsie Stain, MD  vitamin C (VITAMIN C) 500 MG tablet Take 1 tablet (500 mg total) by mouth daily. Please take for 10 days 05/29/19  Yes Elgergawy, Silver Huguenin, MD  zinc sulfate 220 (50 Zn) MG capsule Take 1 capsule (220 mg total) by mouth daily. Please take for 10 days 05/29/19  Yes Elgergawy, Silver Huguenin, MD  triamcinolone (KENALOG) 0.1 % Apply topically. Patient not taking: Reported on 08/10/2021 11/03/20   [provider]    ROS:  Out of a complete 14 system review of symptoms, the patient complains  only of the following symptoms, and all other reviewed systems are negative.  Decreased hearing, left  Blood pressure 104/70, pulse 80, height '5\' 10"'$  (1.778 m), weight 182 lb (82.6 kg).  Physical Exam  General: The patient is alert and cooperative at the time of the examination.  Skin: No significant peripheral edema is noted.   Neurologic Exam  Mental status: The patient is alert and oriented x 3 at the time of the examination. The patient has apparent normal recent and remote memory, with an apparently normal attention span and concentration ability.   Cranial nerves: Facial symmetry is  present. Speech is normal, no aphasia or dysarthria is noted. Extraocular movements are full. Visual fields are full.  Motor: The patient has good strength in all 4 extremities.  Sensory examination: Soft touch sensation is symmetric on the face, arms, and legs.  Coordination: The patient has good finger-nose-finger and heel-to-shin bilaterally.  Gait and station: The patient has a normal gait. Tandem gait is normal. Romberg is negative. No drift is seen.  Reflexes: Deep tendon reflexes are symmetric.   MRI brain 05/13/19:  IMPRESSION: This MRI of the brain with and without contrast shows the following: 1.  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 2.   There is a normal enhancement pattern elsewhere in the brain. 3.   Mild chronic microvascular ischemic changes.  There were no acute findings.  * MRI scan images were reviewed online. I agree with the written report.    Assessment/Plan:  1.  Acoustic neuroma, left  2.  Left hemifacial spasm, well controlled  The patient will be set up for MRI of the brain to follow-up with the prior study.  If the tumor appears to be stable, scanning procedures could be done every 3 to 4 years.  The patient will continue the carbamazepine as this has been very effective in controlling her hemifacial spasm.  The patient in the future can follow-up with Dr. Krista Blue.  Jill Alexanders MD 08/10/2021 11:32 AM  [facility] [address] [place], [place] [address]  Phone [phone] Fax [phone]

## 2021-08-11 ENCOUNTER — Telehealth: Payer: Self-pay

## 2021-08-11 NOTE — Telephone Encounter (Signed)
I have called the pt to f/u on this message from Dr. Jannifer Franklin.  I have reviewed the pt's chart and cannot see where a rx has been sent to [facility] mail service in the past? I found previous rx being [facility] and [facility]? Also attempted a PA on cover my meds and was given comment PA is not needed.  The prescription for the Carbamazepine might just need to be sent for [facility] to process.

## 2021-08-11 NOTE — Telephone Encounter (Signed)
-----   Message from Kathrynn Ducking, MD sent at 08/10/2021  2:20 PM EDT ----- I talked to the patient today, she indicates that she is having difficulty getting her carbamazepine approved through [facility] mail order pharmacy, they are questioning the reason for its use, the patient has been treated for hemifacial spasm associated with a left acoustic neuroma, the carbamazepine has been very effective in controlling the hemifacial spasm.  Please contact the pharmacy and see if they can get the medication approved, I will be happy to write a letter if needed.  She has been on this medication for quite some time.

## 2021-08-13 ENCOUNTER — Other Ambulatory Visit: Payer: Self-pay

## 2021-08-13 ENCOUNTER — Ambulatory Visit
Admission: RE | Admit: 2021-08-13 | Discharge: 2021-08-13 | Disposition: A | Discharge status: Home / Self Care | Payer: Medicare Other | Source: Ambulatory Visit | Attending: Neurology | Admitting: Neurology

## 2021-08-13 DIAGNOSIS — Z86018 Personal history of other benign neoplasm: Secondary | ICD-10-CM | POA: Diagnosis not present

## 2021-08-13 MED ORDER — GADOBENATE DIMEGLUMINE 529 MG/ML IV SOLN
17.0000 mL | Freq: Once | INTRAVENOUS | Status: AC | PRN
  Administered 2021-08-13: 17 mL via INTRAVENOUS

## 2021-08-15 ENCOUNTER — Telehealth: Payer: Self-pay | Admitting: Neurology

## 2021-08-15 ENCOUNTER — Other Ambulatory Visit: Payer: Self-pay | Admitting: Neurology

## 2021-08-15 MED ORDER — CARBAMAZEPINE 200 MG PO TABS: ORAL_TABLET | Freq: Two times a day (BID) | ORAL | 1 refills | Status: DC

## 2021-08-15 NOTE — Telephone Encounter (Signed)
I called the patient.  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  We will continue to follow over time.   MRI brain 08/14/21:  IMPRESSION:    MRI brain / IAC (with and without) demonstrating: - Left cerebellopontine angle enhancing mass measuring 1.7 x 1.1 x 1.1 cm (AP x trans x SI). Findings consistent with vestibular schwannoma.  - Compared to MRI from 05/11/19, no significant change.

## 2021-08-22 IMAGING — MR MR HIP*L* W/O CM
4 of 5 series · 22 of 40 positions shown · non-contrast
Comparison: Plain films left hip from [REDACTED] 10/22/2019.

CLINICAL DATA: Chronic left hip pain, worsening. Question avascular
necrosis.

EXAM:
MR OF THE LEFT HIP WITHOUT CONTRAST
TECHNIQUE: Multiplanar, multisequence MR imaging was performed. No intravenous
contrast was administered.

[Series 3: T1 · coronal · 4.0mm · 0.74mm/px · 7 of 21 slices shown]
[im 1/21]
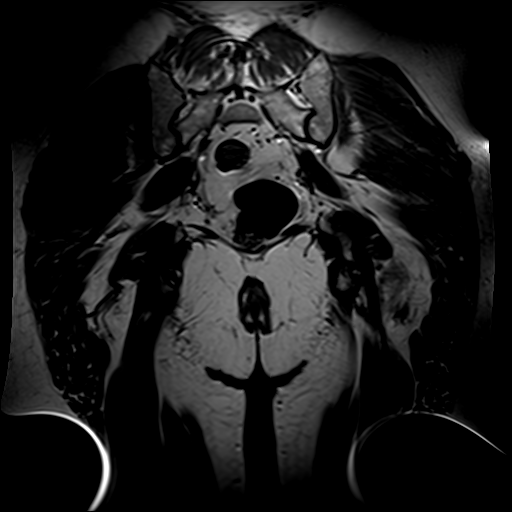
[im 4/21]
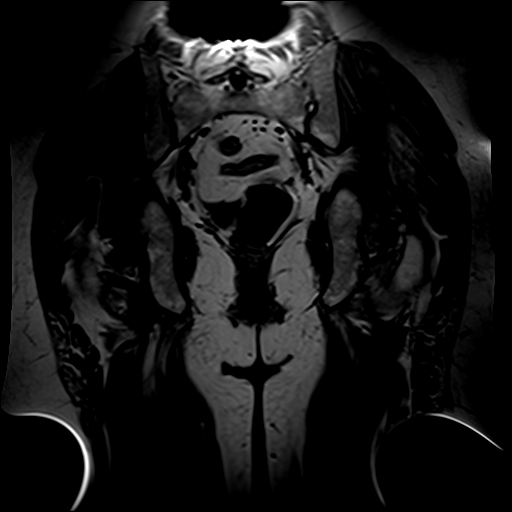
[im 7/21]
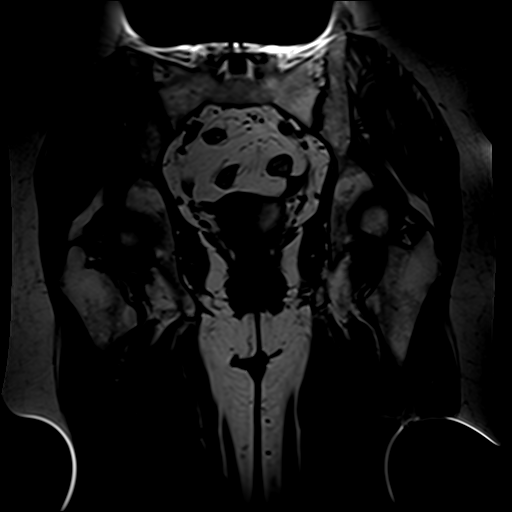
[im 11/21]
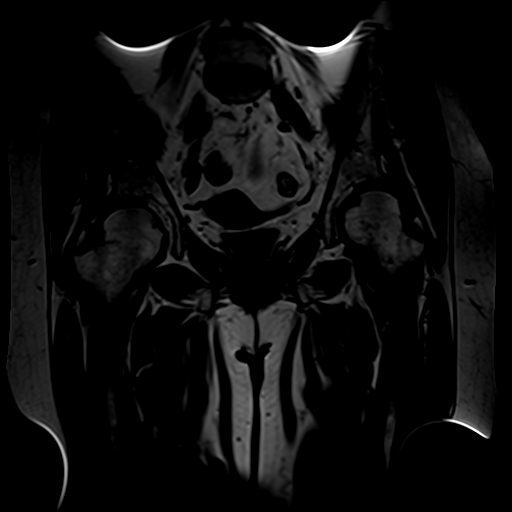
[im 14/21]
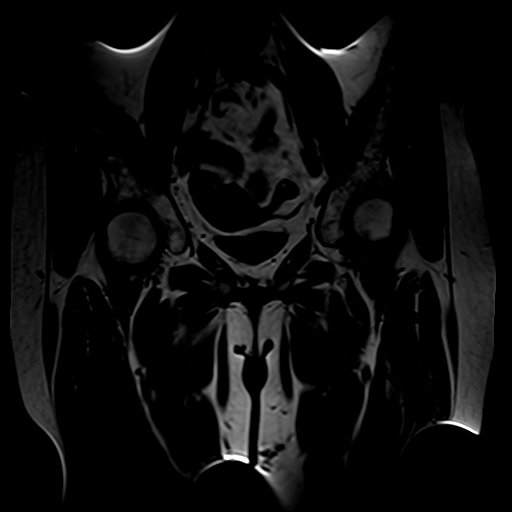
[im 17/21]
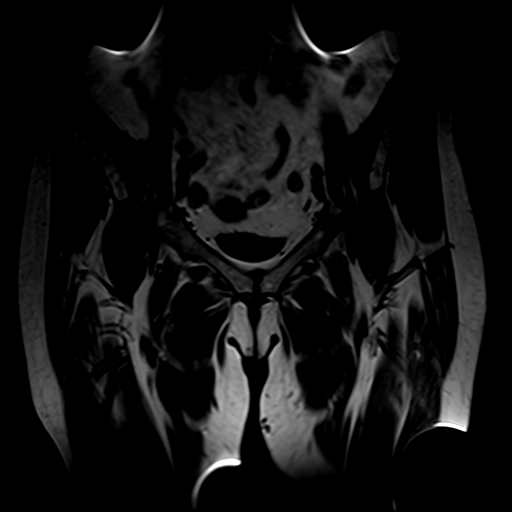
[im 21/21]
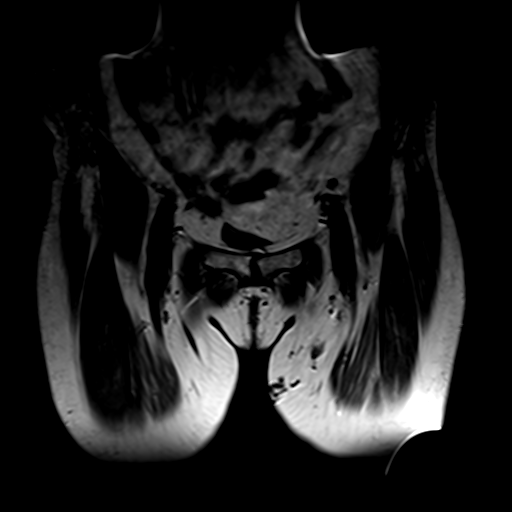

[Series 4: T2 fat-sat · coronal · 4.0mm · 0.74mm/px · 8 of 24 slices shown (1 of 2)]
[im 1/24]
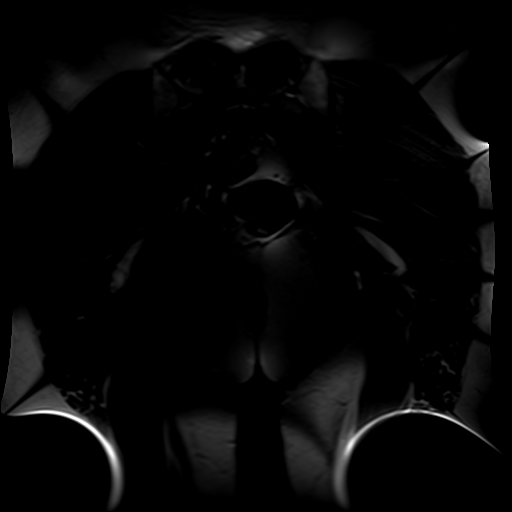
[im 4/24]
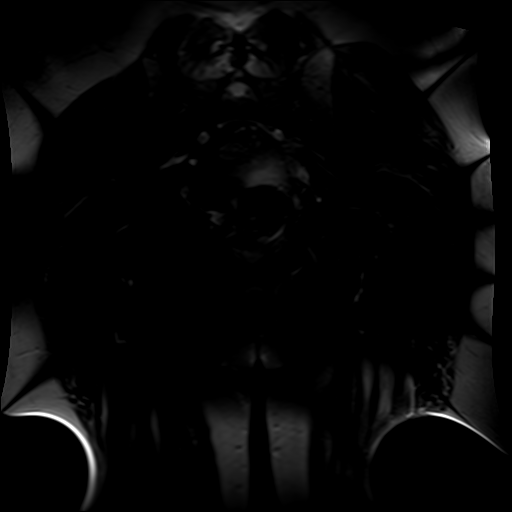
[im 7/24]
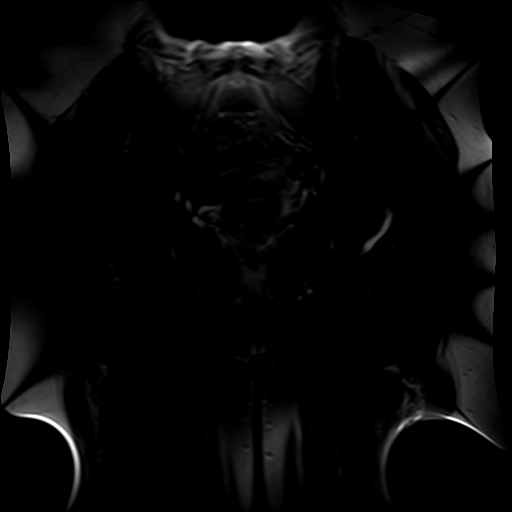
[im 10/24]
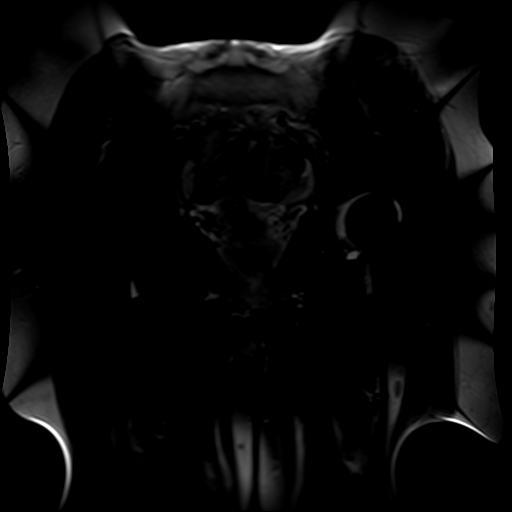
[im 14/24]
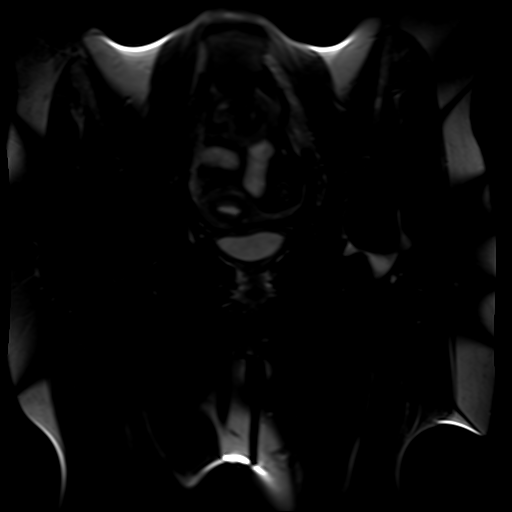
[im 17/24]
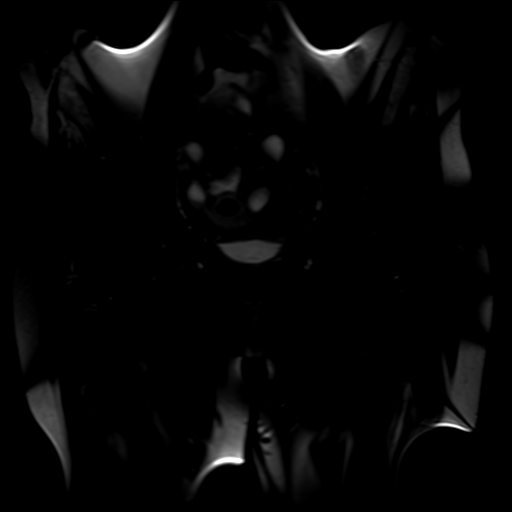
[im 20/24]
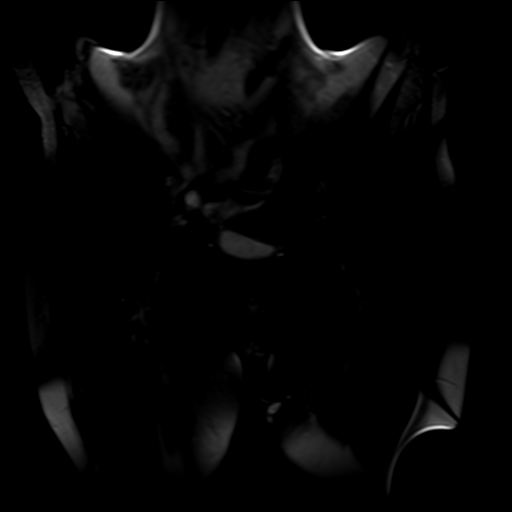
[im 24/24]
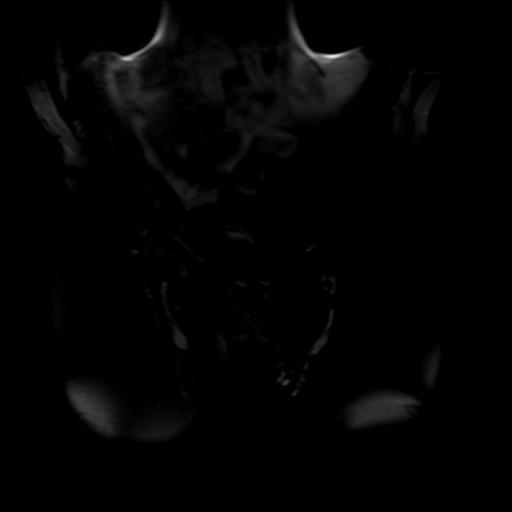

[Series 5: T2 fat-sat · axial · 4.0mm · 0.35mm/px · z∈[-49,+66]mm · 4 of 27 slices shown (2 of 2)]
[im 1/27]
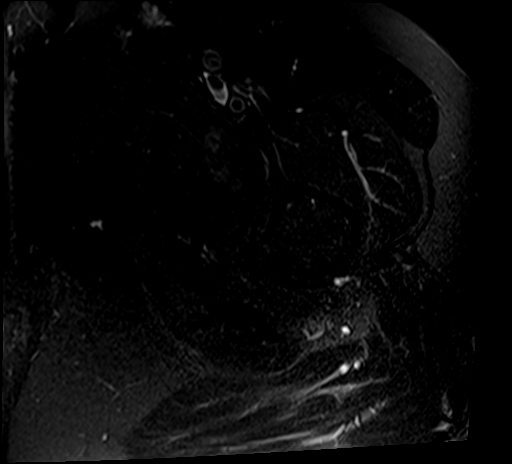
[im 3/27]
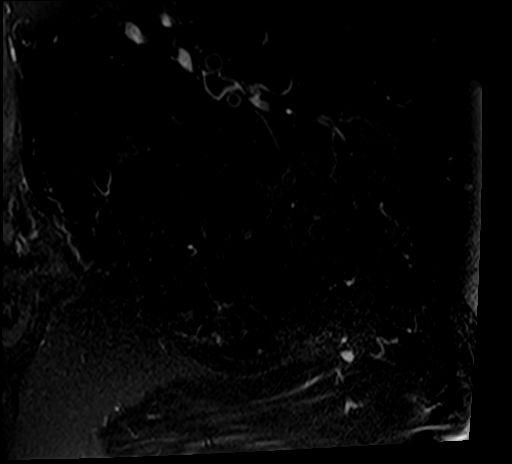
[im 15/27]
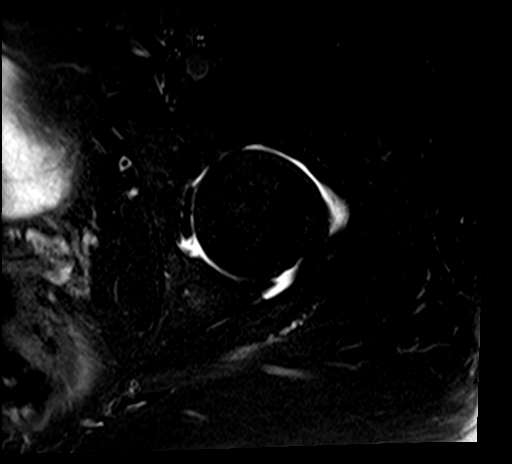
[im 24/27]
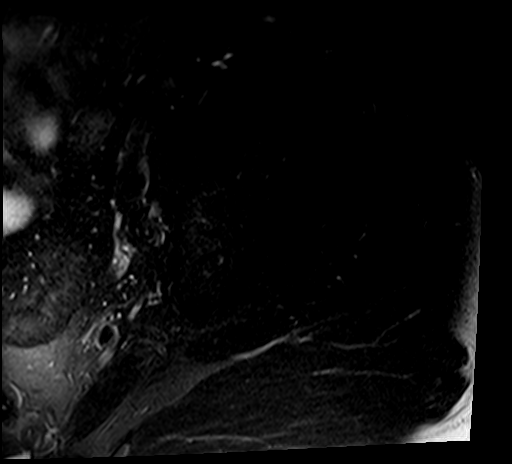

[Series 6: PD fat-sat · sagittal · 4.0mm · 0.70mm/px · 3 of 22 slices shown]
[im 4/22]
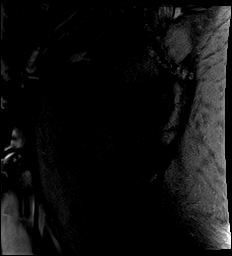
[im 13/22]
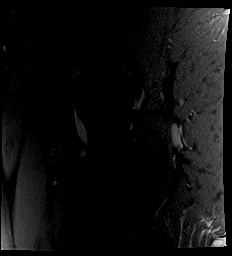
[im 19/22]
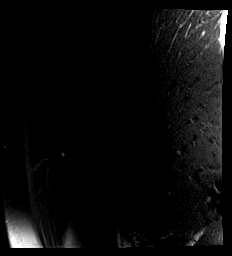

[22 of 40 positions shown; findings below may reference images not displayed]

FINDINGS: Bones: There is no avascular necrosis of the femoral heads. No
fracture, stress change or focal lesion. Mild subchondral edema is
seen in the left acetabulum.

Articular cartilage and labrum

Articular cartilage: Thinning is worst anteriorly and superiorly
where there is associated joint space narrowing.

Labrum:  The left anterior labrum is degenerated and torn.

Joint or bursal effusion

Joint effusion:  Small effusion on the left.

Bursae: Negative.

Muscles and tendons

Muscles and tendons: Mild intrasubstance increased T2 signal is seen
in the left hamstrings at their origin consistent with tendinosis.
No tear.

Other findings

Miscellaneous:   Imaged intrapelvic contents are negative.
IMPRESSION: 1. No evidence of avascular necrosis.
2. Moderate left hip osteoarthritis with associated degeneration and
tearing of the anterior labrum.
3. Mild tendinosis of the left hamstrings at their origin without
tear.

## 2021-08-23 ENCOUNTER — Other Ambulatory Visit: Payer: Self-pay

## 2021-08-23 ENCOUNTER — Encounter (HOSPITAL_COMMUNITY): Payer: Self-pay | Admitting: Emergency Medicine

## 2021-08-23 ENCOUNTER — Emergency Department (HOSPITAL_COMMUNITY): Payer: Medicare Other

## 2021-08-23 ENCOUNTER — Emergency Department (HOSPITAL_COMMUNITY)
Admission: EM | Admit: 2021-08-23 | Discharge: 2021-08-23 | Disposition: A | Discharge status: Home / Self Care | Payer: Medicare Other | Attending: Emergency Medicine | Admitting: Emergency Medicine

## 2021-08-23 DIAGNOSIS — Z79899 Other long term (current) drug therapy: Secondary | ICD-10-CM | POA: Insufficient documentation

## 2021-08-23 DIAGNOSIS — I10 Essential (primary) hypertension: Secondary | ICD-10-CM | POA: Diagnosis not present

## 2021-08-23 DIAGNOSIS — Z87891 Personal history of nicotine dependence: Secondary | ICD-10-CM | POA: Diagnosis not present

## 2021-08-23 DIAGNOSIS — Z8616 Personal history of COVID-19: Secondary | ICD-10-CM | POA: Diagnosis not present

## 2021-08-23 DIAGNOSIS — J449 Chronic obstructive pulmonary disease, unspecified: Secondary | ICD-10-CM | POA: Diagnosis not present

## 2021-08-23 DIAGNOSIS — Z28310 Unvaccinated for covid-19: Secondary | ICD-10-CM | POA: Insufficient documentation

## 2021-08-23 DIAGNOSIS — U071 COVID-19: Secondary | ICD-10-CM

## 2021-08-23 DIAGNOSIS — R0602 Shortness of breath: Secondary | ICD-10-CM | POA: Diagnosis present

## 2021-08-23 LAB — BASIC METABOLIC PANEL
Anion gap: 7 (ref 5–15)
BUN: 11 mg/dL (ref 6–20)
CO2: 33 mmol/L — ABNORMAL HIGH (ref 22–32)
Calcium: 9.4 mg/dL (ref 8.9–10.3)
Chloride: 99 mmol/L (ref 98–111)
Creatinine, Ser: 0.88 mg/dL (ref 0.44–1.00)
GFR, Estimated: >60 mL/min (ref >60)
Glucose, Bld: 108 mg/dL — ABNORMAL HIGH (ref 70–99)
Potassium: 2.9 mmol/L — ABNORMAL LOW (ref 3.5–5.1)
Sodium: 139 mmol/L (ref 135–145)

## 2021-08-23 LAB — CBC
HCT: 43.7 % (ref 36.0–46.0)
Hemoglobin: 14.3 g/dL (ref 12.0–15.0)
MCH: 31.2 pg (ref 26.0–34.0)
MCHC: 32.7 g/dL (ref 30.0–36.0)
MCV: 95.4 fL (ref 80.0–100.0)
Platelets: 182 10*3/uL (ref 150–400)
RBC: 4.58 MIL/uL (ref 3.87–5.11)
RDW: 13.3 % (ref 11.5–15.5)
WBC: 8.2 10*3/uL (ref 4.0–10.5)
nRBC: 0 % (ref 0.0–0.2)

## 2021-08-23 LAB — RESP PANEL BY RT-PCR (FLU A&B, COVID) ARPGX2
Influenza A by PCR: NEGATIVE
Influenza B by PCR: NEGATIVE
SARS Coronavirus 2 by RT PCR: POSITIVE — AB

## 2021-08-23 LAB — TROPONIN I (HIGH SENSITIVITY)
Troponin I (High Sensitivity): 6 ng/L (ref <18)
Troponin I (High Sensitivity): 4 ng/L (ref <18)

## 2021-08-23 MED ORDER — PREDNISONE 10 MG PO TABS
50.0000 mg | ORAL_TABLET | Freq: Every day | ORAL | 0 refills | Status: DC
  Filled 2021-08-23: qty 25, 5d supply, fill #0

## 2021-08-23 MED ORDER — ACETAMINOPHEN 325 MG PO TABS
650.0000 mg | ORAL_TABLET | Freq: Once | ORAL | Status: AC | PRN
  Administered 2021-08-23: 650 mg via ORAL
  Filled 2021-08-23: qty 2

## 2021-08-23 MED ORDER — PREDNISONE 20 MG PO TABS
60.0000 mg | ORAL_TABLET | Freq: Once | ORAL | Status: AC
  Administered 2021-08-23: 60 mg via ORAL
  Filled 2021-08-23: qty 3

## 2021-08-23 MED ORDER — GUAIFENESIN 100 MG/5ML PO LIQD
100.0000 mg | ORAL | 0 refills | Status: AC | PRN
  Filled 2021-08-23: qty 60, 1d supply, fill #0

## 2021-08-23 MED ORDER — DEXAMETHASONE SODIUM PHOSPHATE 10 MG/ML IJ SOLN: 10.0000 mg | Freq: Once | INTRAMUSCULAR | Status: DC

## 2021-08-23 MED ORDER — ALBUTEROL SULFATE HFA 108 (90 BASE) MCG/ACT IN AERS
2.0000 | INHALATION_SPRAY | RESPIRATORY_TRACT | 0 refills | Status: DC | PRN
  Filled 2021-08-23: qty 18, 25d supply, fill #0

## 2021-08-23 MED ORDER — IPRATROPIUM-ALBUTEROL 0.5-2.5 (3) MG/3ML IN SOLN
3.0000 mL | Freq: Once | RESPIRATORY_TRACT | Status: AC
  Administered 2021-08-23: 3 mL via RESPIRATORY_TRACT
  Filled 2021-08-23: qty 3

## 2021-08-23 NOTE — ED Triage Notes (Signed)
Pt reports SOB, chest pain, and productive cough with cream colored sputum that started today.   Hasn't taken anything for fever.

## 2021-08-23 NOTE — Discharge Instructions (Addendum)
The work-up in the ER confirms that you have COVID-19.  CDC guideline recommends that you quarantine yourself for 5 days. Thereafter, you can return to public space but with the mask on for another 5 days.  Thereafter you are completely cleared from quarantine.  You are unvaccinated, therefore it is possible that your symptoms can get worse.  If you start having worsening shortness of breath, confusion, inability to keep any food or water down  -return to the ER.

## 2021-08-23 NOTE — ED Provider Notes (Signed)
Emergency Medicine Provider Triage Evaluation Note  Alfredia Petti , a 55 y.o. female  was evaluated in triage.  Pt complains of SHOB, fever, CP, feeling unwell onset this morning, no known sick contacts. Feels similar to prior PNA. Has COPD, had neb earlier today, wheezing.   Review of Systems  Positive: CP, SHOB, fever, wheezing Negative: cough  Physical Exam  BP 116/73 (BP Location: Left Arm)   Pulse (!) 107   Temp (!) 101.2 F (38.4 C) (Oral)   Resp 20   SpO2 97%  Gen:   Awake, speaks in short sentences  Resp:  Increased effort, wheezing MSK:   Moves extremities without difficulty  Other:    Medical Decision Making  Medically screening exam initiated at 2:38 PM.  Appropriate orders placed.  [name] was informed that the remainder of the evaluation will be completed by another provider, this initial triage assessment does not replace that evaluation, and the importance of remaining in the ED until their evaluation is complete.     Tacy Learn, PA-C 08/23/21 1443    Varney Biles, MD 08/23/21 2156

## 2021-08-23 NOTE — ED Provider Notes (Signed)
[facility] EMERGENCY DEPARTMENT Provider Note   CSN: [id number] Arrival date & time: 08/23/21  1412     History Chief Complaint  Patient presents with   Chest Pain    [facility] is a 55 y.o. female.  HPI     55 y/o F w/ cc of chest pain, shortness of breath. She has hx of COPD, PFO. She had some flu like symptoms last week (starting Tuesday) with low grade fevers. Was feeling well this weekend until today. Pt was at church today and started feeling unwell. ROS is positive for cough - white phlegm, chest pain that is worse with cough/inspirations.  Pt has no hx of PE, DVT and denies any exogenous hormone (testosterone / estrogen) use, long distance travels or surgery in the past 6 weeks, active cancer, recent immobilization.  He has not received COVID-19 vaccination.  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   Left ear hearing loss 12/05/2018   PFO (patent foramen ovale)    noted on coronary CTA 06/2021   Pneumonia due to COVID-19 virus 05/24/2019    Patient Active Problem List   Diagnosis Date Noted   PFO (patent foramen ovale) 07/01/2021   Centrilobular emphysema ([place]) 07/17/2019   Postmenopausal bleeding 03/09/2019   Chronic respiratory failure with hypoxia ([place]) 01/23/2019   Hypertension 12/25/2018   GERD (gastroesophageal reflux disease) 12/25/2018   Left ear hearing loss 12/05/2018   History of acoustic neuroma 12/05/2018   Varicose veins of left lower extremity 12/05/2018   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 ([place]) 12/05/2018   Hyperlipidemia 03/04/2016   Polyp of colon 11/03/2015    Past Surgical History:  Procedure Laterality Date   cyber knife     ectopic pregnanacy       OB History   No obstetric history on  file.     Family History  Problem Relation Age of Onset   Hypothyroidism Mother    Diabetes Mellitus II Maternal Aunt    Diabetes Maternal Aunt    Cancer Father    Breast cancer Neg Hx     Social History   Tobacco Use   Smoking status: Former   Smokeless tobacco: Never  Vaping Use   Vaping Use: Never used  Substance Use Topics   Alcohol use: Yes    Alcohol/week: 3.0 - 4.0 standard drinks    Types: 1 Glasses of wine, 2 - 3 Cans of beer per week    Comment: a night   Drug use: Not Currently    Home Medications Prior to Admission medications   Medication Sig Start Date End Date Taking? Authorizing Provider  albuterol (VENTOLIN HFA) 108 (90 Base) MCG/ACT inhaler Inhale 2 puffs into the lungs every 4 (four) hours as needed for wheezing or shortness of breath. 08/23/21  Yes Varney Biles, MD  guaiFENesin (ROBITUSSIN) 100 MG/5ML liquid Take 5-10 mLs (100-200 mg total) by mouth every 4 (four) hours as needed for cough. 08/23/21  Yes Jolissa Kapral, MD  predniSONE (DELTASONE) 10 MG tablet Take 5 tablets (50 mg total) by mouth daily. 08/23/21  Yes Varney Biles, MD  ALPRAZolam Duanne Moron) 0.5 MG tablet Take 2 tablets approximately 45 minutes prior to the MRI study, take a third tablet if needed. 08/10/21   Kathrynn Ducking, MD  atorvastatin (LIPITOR) 10 MG tablet TAKE 1 TABLET (10 MG TOTAL) BY MOUTH DAILY. Patient taking differently: Take 10 mg by mouth daily. 08/04/21 08/04/22  Elsie Stain, MD  carbamazepine (TEGRETOL) 200 MG tablet TAKE 1 TABLET BY MOUTH TWICE DAILY Patient taking differently: Take 200 mg by mouth 2 (two) times daily. 08/15/21 08/15/22  Kathrynn Ducking, MD  Fluticasone-Umeclidin-Vilant 100-62.5-25 MCG/INH AEPB TAKE 1 PUFF BY MOUTH DAILY. Patient taking differently: 1 puff by Infiltration route daily. 08/04/21 08/04/22  Elsie Stain, MD  OXYGEN Inhale 2 L into the lungs at bedtime.    [provider]  triamcinolone (KENALOG) 0.1 % Apply topically. Patient  not taking: Reported on 08/10/2021 11/03/20   [provider]  valsartan-hydrochlorothiazide (DIOVAN-HCT) 160-12.5 MG tablet TAKE 1 TABLET BY MOUTH DAILY. 08/04/21 08/04/22  Elsie Stain, MD  vitamin C (VITAMIN C) 500 MG tablet Take 1 tablet (500 mg total) by mouth daily. Please take for 10 days 05/29/19   Elgergawy, Silver Huguenin, MD  zinc sulfate 220 (50 Zn) MG capsule Take 1 capsule (220 mg total) by mouth daily. Please take for 10 days 05/29/19   Elgergawy, Silver Huguenin, MD    Allergies    Patient has no known allergies.  Review of Systems   Review of Systems  Constitutional:  Positive for activity change.  Respiratory:  Positive for cough and shortness of breath.   Cardiovascular:  Positive for chest pain.  Gastrointestinal:  Negative for nausea and vomiting.  Allergic/Immunologic: Negative for immunocompromised state.  All other systems reviewed and are negative.  Physical Exam Updated Vital Signs BP 112/77   Pulse 88   Temp 99.6 F (37.6 C)   Resp 17   Ht '5\' 10"'$  (1.778 m)   Wt 79.4 kg   SpO2 94%   BMI 25.11 kg/m   Physical Exam Vitals and nursing note reviewed.  Constitutional:      Appearance: She is well-developed.  HENT:     Head: Atraumatic.  Cardiovascular:     Rate and Rhythm: Normal rate.  Pulmonary:     Effort: Pulmonary effort is normal.     Breath sounds: Decreased breath sounds and wheezing present. No rhonchi.  Musculoskeletal:     Cervical back: Normal range of motion and neck supple.     Right lower leg: No edema.     Left lower leg: No edema.  Skin:    General: Skin is warm and dry.  Neurological:     Mental Status: She is alert and oriented to person, place, and time.    ED Results / Procedures / Treatments   Labs (all labs ordered are listed, but only abnormal results are displayed) Labs Reviewed  RESP PANEL BY RT-PCR (FLU A&B, COVID) ARPGX2 - Abnormal; Notable for the following components:      Result Value   SARS Coronavirus 2 by RT  PCR POSITIVE (*)    All other components within normal limits  BASIC METABOLIC PANEL - Abnormal; Notable for the following components:   Potassium 2.9 (*)    CO2 33 (*)    Glucose, Bld 108 (*)    All other components within normal limits  CBC  TROPONIN I (HIGH SENSITIVITY)  TROPONIN I (HIGH SENSITIVITY)    EKG EKG Interpretation  Date/Time:  Sunday August 23 2021 14:40:16 EDT Ventricular Rate:  110 PR Interval:  132 QRS Duration: 78 QT Interval:  316 QTC Calculation: 427 R Axis:   72 Text Interpretation: Sinus tachycardia  Nonspecific T wave abnormality Abnormal ECG No acute changes No significant change since last tracing Nonspecific ST abnormality Confirmed by Varney Biles [phone]) on 08/23/2021 4:10:46 PM  Radiology DG Chest 2 View  Result Date: 08/23/2021 CLINICAL DATA:  Pt c/o chest pain, SOB, productive cough x 1 day. Hx of COPD, GERD, AND PNA. Pt is a former smoker. EXAM: CHEST - 2 VIEW COMPARISON:  05/23/2019 FINDINGS: Coarse bibasilar interstitial markings with linear scarring or atelectasis as before. No new airspace disease. Heart size and mediastinal contours are within normal limits. No effusion.  No pneumothorax. Visualized bones unremarkable. IMPRESSION: Chronic bibasilar interstitial opacities.  No acute findings. Electronically Signed   By: Lucrezia Europe M.D.   On: 08/23/2021 14:59    Procedures Procedures   Medications Ordered in ED Medications  dexamethasone (DECADRON) injection 10 mg (has no administration in time range)  acetaminophen (TYLENOL) tablet 650 mg (650 mg Oral Given 08/23/21 1442)  predniSONE (DELTASONE) tablet 60 mg (60 mg Oral Given 08/23/21 1609)  ipratropium-albuterol (DUONEB) 0.5-2.5 (3) MG/3ML nebulizer solution 3 mL (3 mLs Nebulization Given 08/23/21 1612)    ED Course  I have reviewed the triage vital signs and the nursing notes.  Pertinent labs & imaging results that were available during my care of the patient were reviewed by me and  considered in my medical decision making (see chart for details).  Clinical Course as of 08/23/21 1814  Nancy Fetter Aug 23, 2021  1730 Repeat exam reveals clearing of wheezing, chest is still tight in all lung fields. Patient is not in any respiratory distress nor is there hypoxia. She reports feeling a lot better and looks more comfortable. Covid test is pending.  [AN]  1813 Resp Panel by RT-PCR (Flu A&B, Covid) Nasopharyngeal Swab(!) COVID-19 test is positive.  When patient was reassessed few minutes ago, she was not wheezing and looked a lot comfortable.  She felt a lot better and was not hypoxic.  It appears to [place] that patient has been symptomatic at least since Tuesday.  She had this lucid interval on Saturday, before feeling worse again today.  My suspicion is that she has had COVID-19 since Tuesday.  She is outside of any therapeutic window at this time.  We will start her on steroids and give her Mucinex.  Strict ER return precautions have been discussed, and patient is agreeing with the plan and is comfortable with the workup done and the recommendations from the ER.  [AN]    Clinical Course User Index [AN] Varney Biles, MD   MDM Rules/Calculators/A&P                           55 y/o F with hx of COPD comes in with cc of pleuritic chest pain, productive cough and fevers starting today, but she also had vague flu like symptoms last week.  ? Covid. ? Post covid PE.  Other possibility includes CAP, copd exacerbation.    Final Clinical Impression(s) / ED Diagnoses Final diagnoses:  COVID-19    Rx / DC Orders ED Discharge Orders          Ordered    predniSONE (DELTASONE) 10 MG tablet  Daily        08/23/21 1811    albuterol (VENTOLIN HFA) 108 (90 Base) MCG/ACT inhaler  Every 4 hours PRN        08/23/21 1811    guaiFENesin (ROBITUSSIN) 100 MG/5ML liquid  Every 4 hours PRN  08/23/21 [place], Adamari Frede, MD 08/23/21 [id number]

## 2021-08-25 ENCOUNTER — Other Ambulatory Visit: Payer: Self-pay

## 2021-08-25 ENCOUNTER — Telehealth: Payer: Self-pay

## 2021-08-25 NOTE — Telephone Encounter (Signed)
Transition Care Management Unsuccessful Follow-up Telephone Call  Date of discharge and from where:  08/23/2021 from [facility]  Attempts:  1st Attempt  Reason for unsuccessful TCM follow-up call:  Left voice message

## 2021-08-27 NOTE — Telephone Encounter (Signed)
Transition Care Management Unsuccessful Follow-up Telephone Call  Date of discharge and from where:  08/23/2021 [facility]  Attempts:  2nd Attempt  Reason for unsuccessful TCM follow-up call:  Left voice message

## 2021-08-28 NOTE — Telephone Encounter (Signed)
Transition Care Management Follow-up Telephone Call Date of discharge and from where: 08/23/2021 from [facility] ED How have you been since you were released from the hospital? Pt stated that she is feeling very well and did not have any questions or concerns at this time.  Any questions or concerns? No  Items Reviewed: Did the pt receive and understand the discharge instructions provided? Yes  Medications obtained and verified? Yes  Other? No  Any new allergies since your discharge? No  Dietary orders reviewed? No Do you have support at home? Yes   Functional Questionnaire: (I = Independent and D = Dependent) ADLs: I  Bathing/Dressing- I  Meal Prep- I  Eating- I  Maintaining continence- I  Transferring/Ambulation- I  Managing Meds- I   Follow up appointments reviewed:  PCP Hospital f/u appt confirmed? No   Specialist Hospital f/u appt confirmed? No   Are transportation arrangements needed? No  If their condition worsens, is the pt aware to call PCP or go to the Emergency Dept.? Yes Was the patient provided with contact information for the PCP's office or ED? Yes Was to pt encouraged to call back with questions or concerns? Yes

## 2021-09-09 ENCOUNTER — Other Ambulatory Visit: Payer: Self-pay | Admitting: Critical Care Medicine

## 2021-09-09 ENCOUNTER — Other Ambulatory Visit: Payer: Self-pay

## 2021-09-09 NOTE — Telephone Encounter (Signed)
Requested medication (s) are due for refill today - unsure- listed as discontinued- but still list   Requested medication (s) are on the active medication list -yes  Future visit scheduled -no  Last refill: 08/04/21 #180 1RF  Notes to clinic: Request RF: medication not assigned protocol- patient requesting 90 day supply  Requested Prescriptions  Pending Prescriptions Disp Refills   carbamazepine (TEGRETOL) 200 MG tablet 180 tablet 1    Sig: TAKE 1 TABLET BY MOUTH TWICE DAILY     Not Delegated - Neurology:  Anticonvulsants - carbamazepine Failed - 09/09/2021  2:01 PM      Failed - This refill cannot be delegated      Failed - AST in normal range and within 90 days    AST  Date Value Ref Range Status  03/10/2021 16 0 - 40 IU/L Final          Failed - ALT in normal range and within 90 days    ALT  Date Value Ref Range Status  03/10/2021 16 0 - 32 IU/L Final          Failed - Carbamazepine (serum) in normal range and within 360 days    Carbamazepine (Tegretol), S  Date Value Ref Range Status  03/26/2020 4.5 4.0 - 12.0 ug/mL Final    Comment:             In conjunction with other antiepileptic drugs                                Therapeutic  4.0 -  8.0                                Toxicity     9.0 - 12.0                                    Carbamazepine alone                                Therapeutic  8.0 - 12.0                                 Detection Limit =  2.0                           <2.0 indicated None Detected           Passed - WBC in normal range and within 90 days    WBC  Date Value Ref Range Status  08/23/2021 8.2 4.0 - 10.5 K/uL Final          Passed - PLT in normal range and within 90 days    Platelets  Date Value Ref Range Status  08/23/2021 182 150 - 400 K/uL Final  03/10/2021 217 150 - 450 x10E3/uL Final          Passed - HGB in normal range and within 90 days    Hemoglobin  Date Value Ref Range Status  08/23/2021 14.3 12.0 - 15.0 g/dL  Final  03/10/2021 15.4 11.1 - 15.9 g/dL Final          Passed -  Na in normal range and within 90 days    Sodium  Date Value Ref Range Status  08/23/2021 139 135 - 145 mmol/L Final  07/06/2021 140 134 - 144 mmol/L Final          Passed - HCT in normal range and within 90 days    HCT  Date Value Ref Range Status  08/23/2021 43.7 36.0 - 46.0 % Final   Hematocrit  Date Value Ref Range Status  03/10/2021 44.9 34.0 - 46.6 % Final          Passed - Valid encounter within last 12 months    Recent Outpatient Visits           1 month ago Primary hypertension   [place] Elsie Stain, MD   6 months ago Primary hypertension   [place] Elsie Stain, MD   10 months ago 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 [facility])   [place] Elsie Stain, MD   1 year ago 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 ([place])   [place] Elsie Stain, MD   1 year ago Centrilobular emphysema [facility])   [place] Elsie Stain, MD               Fluticasone-Umeclidin-Vilant (TRELEGY ELLIPTA) 100-62.5-25 MCG/INH AEPB 180 each 1    Sig: INHALE 1 PUFF INTO THE LUNGS DAILY.     Off-Protocol Failed - 09/09/2021  2:01 PM      Failed - Medication not assigned to a protocol, review manually.      Passed - Valid encounter within last 12 months    Recent Outpatient Visits           1 month ago Primary hypertension   [place] Elsie Stain, MD   6 months ago Primary hypertension   [place] Elsie Stain, MD   10 months ago 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 [facility])   [facility] Elsie Stain, MD   1 year ago 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 ([place])   [facility] Elsie Stain, MD   1 year ago Centrilobular emphysema [facility])   [place] Elsie Stain, MD                 Requested Prescriptions  Pending Prescriptions Disp Refills   carbamazepine (TEGRETOL) 200 MG tablet 180 tablet 1    Sig: TAKE 1 TABLET BY MOUTH TWICE DAILY     Not Delegated - Neurology:  Anticonvulsants - carbamazepine Failed - 09/09/2021  2:01 PM      Failed - This refill cannot be delegated      Failed - AST in normal range and within 90 days    AST  Date Value Ref Range Status  03/10/2021 16 0 - 40 IU/L Final          Failed - ALT in normal range and within 90 days    ALT  Date Value Ref Range Status  03/10/2021 16 0 - 32 IU/L Final          Failed - Carbamazepine (serum) in normal range and within 360 days    Carbamazepine (Tegretol), S  Date Value  Ref Range Status  03/26/2020 4.5 4.0 - 12.0 ug/mL Final    Comment:             In conjunction with other antiepileptic drugs                                Therapeutic  4.0 -  8.0                                Toxicity     9.0 - 12.0                                    Carbamazepine alone                                Therapeutic  8.0 - 12.0                                 Detection Limit =  2.0                           <2.0 indicated None Detected           Passed - WBC in normal range and within 90 days    WBC  Date Value Ref Range Status  08/23/2021 8.2 4.0 - 10.5 K/uL Final          Passed - PLT in normal range and within 90 days    Platelets  Date Value Ref Range Status  08/23/2021 182 150 - 400 K/uL Final  03/10/2021 217 150 - 450 x10E3/uL Final          Passed - HGB in normal range and within 90 days    Hemoglobin  Date Value Ref Range Status  08/23/2021 14.3 12.0 - 15.0 g/dL Final  03/10/2021 15.4 11.1 - 15.9 g/dL Final          Passed - Na in normal range and within 90 days    Sodium  Date Value Ref Range Status   08/23/2021 139 135 - 145 mmol/L Final  07/06/2021 140 134 - 144 mmol/L Final          Passed - HCT in normal range and within 90 days    HCT  Date Value Ref Range Status  08/23/2021 43.7 36.0 - 46.0 % Final   Hematocrit  Date Value Ref Range Status  03/10/2021 44.9 34.0 - 46.6 % Final          Passed - Valid encounter within last 12 months    Recent Outpatient Visits           1 month ago Primary hypertension   [place] Elsie Stain, MD   6 months ago Primary hypertension   [place], Patrick E, MD   10 months ago 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 [facility])   [place] Elsie Stain, MD   1 year ago 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 [facility])   [place], Patrick E, MD  1 year ago Centrilobular emphysema [facility])   [place] Elsie Stain, MD               Fluticasone-Umeclidin-Vilant (TRELEGY ELLIPTA) 100-62.5-25 MCG/INH AEPB 180 each 1    Sig: INHALE 1 PUFF INTO THE LUNGS DAILY.     Off-Protocol Failed - 09/09/2021  2:01 PM      Failed - Medication not assigned to a protocol, review manually.      Passed - Valid encounter within last 12 months    Recent Outpatient Visits           1 month ago Primary hypertension   [place] Elsie Stain, MD   6 months ago Primary hypertension   [place] Elsie Stain, MD   10 months ago 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 [facility])   [place] Elsie Stain, MD   1 year ago 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 [facility])   [facility] [facility] Elsie Stain, MD   1 year ago Centrilobular emphysema [facility])   [place] [facility] Elsie Stain,  MD

## 2021-09-10 ENCOUNTER — Other Ambulatory Visit: Payer: Self-pay

## 2021-09-10 MED ORDER — TRELEGY ELLIPTA 100-62.5-25 MCG/INH IN AEPB
1.0000 | INHALATION_SPRAY | Freq: Every day | RESPIRATORY_TRACT | 1 refills | Status: DC
  Filled 2021-09-10 – 2021-09-14 (×2): qty 180, 90d supply, fill #0

## 2021-09-14 ENCOUNTER — Other Ambulatory Visit: Payer: Self-pay

## 2021-09-16 ENCOUNTER — Other Ambulatory Visit: Payer: Self-pay

## 2021-09-30 ENCOUNTER — Ambulatory Visit
Admission: RE | Admit: 2021-09-30 | Discharge: 2021-09-30 | Disposition: A | Discharge status: Home / Self Care | Payer: Medicare Other | Source: Ambulatory Visit | Attending: Critical Care Medicine | Admitting: Critical Care Medicine

## 2021-09-30 ENCOUNTER — Other Ambulatory Visit: Payer: Self-pay

## 2021-09-30 DIAGNOSIS — Z1231 Encounter for screening mammogram for malignant neoplasm of breast: Secondary | ICD-10-CM

## 2021-10-01 ENCOUNTER — Other Ambulatory Visit: Payer: Self-pay

## 2021-10-26 ENCOUNTER — Other Ambulatory Visit: Payer: Self-pay

## 2021-10-27 ENCOUNTER — Other Ambulatory Visit: Payer: Self-pay

## 2021-11-06 ENCOUNTER — Other Ambulatory Visit: Payer: Self-pay

## 2021-11-06 ENCOUNTER — Telehealth: Payer: Self-pay | Admitting: *Deleted

## 2021-11-06 MED ORDER — TRELEGY ELLIPTA 100-62.5-25 MCG/ACT IN AEPB
1.0000 | INHALATION_SPRAY | Freq: Every day | RESPIRATORY_TRACT | 4 refills | Status: AC
  Filled 2021-11-06: qty 3, 2d supply, fill #0

## 2021-11-06 MED ORDER — ALBUTEROL SULFATE HFA 108 (90 BASE) MCG/ACT IN AERS
2.0000 | INHALATION_SPRAY | RESPIRATORY_TRACT | 0 refills | Status: AC | PRN
  Filled 2021-11-06: qty 18, 16d supply, fill #0

## 2021-11-06 MED ORDER — ATORVASTATIN CALCIUM 10 MG PO TABS
10.0000 mg | ORAL_TABLET | Freq: Every day | ORAL | 3 refills | Status: AC
  Filled 2021-11-06: qty 90, 90d supply, fill #0

## 2021-11-06 MED ORDER — CARBAMAZEPINE 200 MG PO TABS
200.0000 mg | ORAL_TABLET | Freq: Two times a day (BID) | ORAL | 3 refills | Status: AC
  Filled 2021-11-06: qty 180, 90d supply, fill #0

## 2021-11-06 MED ORDER — VALSARTAN-HYDROCHLOROTHIAZIDE 160-12.5 MG PO TABS
1.0000 | ORAL_TABLET | Freq: Every day | ORAL | 2 refills | Status: AC
  Filled 2021-11-06: qty 90, 90d supply, fill #0

## 2021-11-06 NOTE — Telephone Encounter (Signed)
Refills sent  our pharmacy

## 2021-11-06 NOTE — Telephone Encounter (Signed)
Copied from [place] [id number]. Topic: General - Other >> Sep 08, 2021  2:25 PM Leward Quan A wrote: Reason for CRM: Patient called in to inform Dr Joya Gaskins that [place] ad will need a 90 day supply of her medication to last while she is away especially the Fluticasone-Umeclidin-Vilant (TRELEGY ELLIPTA) 100-62.5-25 MCG/INH AEPB. Please advise   Ph# [phone]

## 2021-11-09 ENCOUNTER — Other Ambulatory Visit: Payer: Self-pay

## 2021-11-17 ENCOUNTER — Other Ambulatory Visit: Payer: Self-pay

## 2022-01-08 ENCOUNTER — Other Ambulatory Visit: Payer: Self-pay

## 2022-01-11 ENCOUNTER — Other Ambulatory Visit: Payer: Self-pay

## 2022-12-01 ENCOUNTER — Other Ambulatory Visit: Payer: Self-pay | Admitting: Critical Care Medicine

## 2022-12-02 ENCOUNTER — Other Ambulatory Visit (HOSPITAL_COMMUNITY): Payer: Self-pay

## 2022-12-07 ENCOUNTER — Other Ambulatory Visit: Payer: Self-pay

## 2023-01-26 ENCOUNTER — Other Ambulatory Visit: Payer: Self-pay | Admitting: Critical Care Medicine
